# Patient Record
Sex: Female | Born: 1965 | Race: White | Hispanic: No | Marital: Single | Smoking: Never smoker
Health system: Southern US, Community
[De-identification: ages and names within clinical notes are randomized; demographics above are authoritative.]

## PROBLEM LIST (undated history)

## (undated) DIAGNOSIS — F419 Anxiety disorder, unspecified: Secondary | ICD-10-CM

## (undated) DIAGNOSIS — M533 Sacrococcygeal disorders, not elsewhere classified: Secondary | ICD-10-CM

## (undated) DIAGNOSIS — R112 Nausea with vomiting, unspecified: Secondary | ICD-10-CM

## (undated) DIAGNOSIS — M810 Age-related osteoporosis without current pathological fracture: Secondary | ICD-10-CM

## (undated) DIAGNOSIS — D649 Anemia, unspecified: Secondary | ICD-10-CM

## (undated) DIAGNOSIS — Z9889 Other specified postprocedural states: Secondary | ICD-10-CM

## (undated) DIAGNOSIS — T4145XA Adverse effect of unspecified anesthetic, initial encounter: Secondary | ICD-10-CM

## (undated) DIAGNOSIS — F329 Major depressive disorder, single episode, unspecified: Secondary | ICD-10-CM

## (undated) DIAGNOSIS — F32A Depression, unspecified: Secondary | ICD-10-CM

## (undated) DIAGNOSIS — T8859XA Other complications of anesthesia, initial encounter: Secondary | ICD-10-CM

## (undated) DIAGNOSIS — M1612 Unilateral primary osteoarthritis, left hip: Secondary | ICD-10-CM

## (undated) DIAGNOSIS — M1990 Unspecified osteoarthritis, unspecified site: Secondary | ICD-10-CM

## (undated) HISTORY — PX: GASTRIC BYPASS: SHX52

## (undated) HISTORY — PX: HERNIA REPAIR: SHX51

## (undated) HISTORY — PX: HAND SURGERY: SHX662

## (undated) HISTORY — DX: Age-related osteoporosis without current pathological fracture: M81.0

---

## 1995-08-30 HISTORY — PX: KNEE ARTHROSCOPY W/ ACL RECONSTRUCTION: SHX1858

## 2002-08-29 HISTORY — PX: CHOLECYSTECTOMY: SHX55

## 2002-08-29 HISTORY — PX: UMBILICAL HERNIA REPAIR: SHX196

## 2018-10-10 ENCOUNTER — Ambulatory Visit (INDEPENDENT_AMBULATORY_CARE_PROVIDER_SITE_OTHER): Payer: Medicaid - Out of State | Admitting: Orthopaedic Surgery

## 2018-10-10 VITALS — Ht 62.0 in | Wt 217.0 lb

## 2018-10-10 DIAGNOSIS — M1612 Unilateral primary osteoarthritis, left hip: Secondary | ICD-10-CM | POA: Diagnosis not present

## 2018-10-10 DIAGNOSIS — M25552 Pain in left hip: Secondary | ICD-10-CM | POA: Diagnosis not present

## 2018-10-10 MED ORDER — TRAMADOL HCL 50 MG PO TABS: 50.0000 mg | ORAL_TABLET | Freq: Four times a day (QID) | ORAL | 0 refills | Status: DC | PRN

## 2018-10-10 NOTE — Progress Notes (Signed)
Office Visit Note   Patient: Kaylee Salas           Date of Birth: 1966/01/21           MRN: [id number] Visit Date: 10/10/2018              Requested by: No referring provider defined for this encounter. PCP: Patient, No Pcp Per   Assessment & Plan: Visit Diagnoses:  1. Pain of left hip joint   2. Unilateral primary osteoarthritis, left hip     Plan: I do feel at this point she would benefit from a total hip arthroplasty of the left hip and I feel comfortable doing this based on examining her in a supine position and seeing how she looks overall on clinical exam and x-rays.  I had a long and thorough discussion with her about the surgery.  I gave her handout about hip replacement surgery.  We went over x-rays in detail.  I talked about the risk and benefits as well as the intraoperative and postoperative course and what to expect.  I do feel this would help give her better quality of life and improve mobility.  She does as well.  All question concerns were answered addressed.  We will work on getting her scheduled soon.  Follow-Up Instructions: Return for 2 weeks post-op.   Orders:  No orders of the defined types were placed in this encounter.  No orders of the defined types were placed in this encounter.     Procedures: No procedures performed   Clinical Data: No additional findings.   Subjective: Chief Complaint  Patient presents with  . Left Hip - Pain  The patient comes in today for evaluation treatment of known severe end-stage arthritis of her left hip.  Hip x-rays accompany her as well.  She lives in [place].  She is a very active individual but is slow down significantly over the last several years.  She did have gastric bypass surgery.  She does weigh 217 pounds at 5 foot 2 which gives her a BMI of 39.  She used to weigh about 150 pounds but with grief of a sister who raised her who died combined with ankle and foot injuries and a knee replacement have her  become more stoic and she gained a lot of weight.  She is now lost a significant of weight since then.  She still cannot take anti-inflammatories.  She does ambulate using crutches.  Her pain in her left hip is in the groin.  Is been worsening for over 2 years now.  At this point it is 10 out of 10 daily and is detrimental factor mobility her quality of life and her activities daily living.  She is not a diabetic and not a smoker.  HPI  Review of Systems She currently denies any headache, chest pain, shortness of breath, fever, chills, nausea, vomiting  Objective: Vital Signs: Ht 5\' 2"  (1.575 m)   Wt 217 lb (98.4 kg)   BMI 39.69 kg/m   Physical Exam She is alert and orient x3 and in no acute distress Ortho Exam Examination of her left hip shows limitations with internal and external rotation and severe pain with attempts of motion.  Her right hip exam is normal.  She has had a right knee replacement.  Her left knee appears stable.  Her left hip is severely painful. Specialty Comments:  No specialty comments available.  Imaging: No results found. X-rays that accompany her of  her pelvis and left hip show severe end-stage arthritis.  There is actually cystic changes in the femoral head and acetabulum which are osteoarthritic in nature.  There is sclerotic changes as well and osteophytes around the joint.  PMFS History: There are no active problems to display for this patient.  No past medical history on file.  No family history on file.   Social History   Occupational History  . Not on file  Tobacco Use  . Smoking status: Not on file  Substance and Sexual Activity  . Alcohol use: Not on file  . Drug use: Not on file  . Sexual activity: Not on file

## 2018-10-18 NOTE — Pre-Procedure Instructions (Addendum)
Kaylee Salas  10/18/2018      [id number] - [place], [place] [address] [place] [address] Phone: [phone] Fax: [phone]    Your procedure is scheduled on Tuesday, March 3rd.  Report to [facility] Admitting Entrance "A" at 10:00 A.M.  Call this number if you have problems the morning of surgery:  [phone]   Remember:  Do not eat or drink after midnight.    Take these medicines the morning of surgery with A SIP OF WATER  DULoxetine (CYMBALTA) gabapentin (NEURONTIN)  hydrOXYzine (ATARAX/VISTARIL) rOPINIRole (REQUIP) traMADol (ULTRAM) -as needed  7 days prior to surgery STOP taking any Aspirin (unless otherwise instructed by your surgeon), Aleve, Naproxen, Ibuprofen, Motrin, Advil, Goody's, BC's, all herbal medications, fish oil, and all vitamins.    Do not wear jewelry, make-up or nail polish.  Do not wear lotions, powders, or perfumes, or deodorant.  Do not shave 48 hours prior to surgery.  Men may shave face and neck.  Do not bring valuables to the hospital.  [facility] is not responsible for any belongings or valuables.  Contacts, dentures or bridgework may not be worn into surgery.  Leave your suitcase in the car.  After surgery it may be brought to your room.  For patients admitted to the hospital, discharge time will be determined by your treatment team.  Patients discharged the day of surgery will not be allowed to drive home.   Special instructions:   [place]- Preparing For Surgery  Before surgery, you can play an important role. Because skin is not sterile, your skin needs to be as free of germs as possible. You can reduce the number of germs on your skin by washing with CHG (chlorahexidine gluconate) Soap before surgery.  CHG is an antiseptic cleaner which kills germs and bonds with the skin to continue killing germs even after washing.    Oral Hygiene is also important  to reduce your risk of infection.  Remember - BRUSH YOUR TEETH THE MORNING OF SURGERY WITH YOUR REGULAR TOOTHPASTE  Please do not use if you have an allergy to CHG or antibacterial soaps. If your skin becomes reddened/irritated stop using the CHG.  Do not shave (including legs and underarms) for at least 48 hours prior to first CHG shower. It is OK to shave your face.  Please follow these instructions carefully.   1. Shower the NIGHT BEFORE SURGERY and the MORNING OF SURGERY with CHG.   2. If you chose to wash your hair, wash your hair first as usual with your normal shampoo.  3. After you shampoo, rinse your hair and body thoroughly to remove the shampoo.  4. Use CHG as you would any other liquid soap. You can apply CHG directly to the skin and wash gently with a scrungie or a clean washcloth.   5. Apply the CHG Soap to your body ONLY FROM THE NECK DOWN.  Do not use on open wounds or open sores. Avoid contact with your eyes, ears, mouth and genitals (private parts). Wash Face and genitals (private parts)  with your normal soap.  6. Wash thoroughly, paying special attention to the area where your surgery will be performed.  7. Thoroughly rinse your body with warm water from the neck down.  8. DO NOT shower/wash with your normal soap after using and rinsing off the CHG Soap.  9. Pat yourself dry with  a CLEAN TOWEL.  10. Wear CLEAN PAJAMAS to bed the night before surgery, wear comfortable clothes the morning of surgery  11. Place CLEAN SHEETS on your bed the night of your first shower and DO NOT SLEEP WITH PETS.    Day of Surgery:  Do not apply any deodorants/lotions.  Please wear clean clothes to the hospital/surgery center.   Remember to brush your teeth WITH YOUR REGULAR TOOTHPASTE.   Please read over the following fact sheets that you were given.

## 2018-10-19 ENCOUNTER — Other Ambulatory Visit (INDEPENDENT_AMBULATORY_CARE_PROVIDER_SITE_OTHER): Payer: Self-pay | Admitting: Physician Assistant

## 2018-10-19 ENCOUNTER — Encounter (HOSPITAL_COMMUNITY): Payer: Self-pay

## 2018-10-19 ENCOUNTER — Encounter (HOSPITAL_COMMUNITY)
Admission: RE | Admit: 2018-10-19 | Discharge: 2018-10-19 | Disposition: A | Discharge status: Home / Self Care | Payer: Medicaid - Out of State | Source: Ambulatory Visit | Attending: Orthopaedic Surgery | Admitting: Orthopaedic Surgery

## 2018-10-19 ENCOUNTER — Other Ambulatory Visit: Payer: Self-pay

## 2018-10-19 DIAGNOSIS — Z01812 Encounter for preprocedural laboratory examination: Secondary | ICD-10-CM | POA: Diagnosis present

## 2018-10-19 HISTORY — DX: Adverse effect of unspecified anesthetic, initial encounter: T41.45XA

## 2018-10-19 HISTORY — DX: Other complications of anesthesia, initial encounter: T88.59XA

## 2018-10-19 HISTORY — DX: Major depressive disorder, single episode, unspecified: F32.9

## 2018-10-19 HISTORY — DX: Depression, unspecified: F32.A

## 2018-10-19 HISTORY — DX: Unspecified osteoarthritis, unspecified site: M19.90

## 2018-10-19 HISTORY — DX: Anxiety disorder, unspecified: F41.9

## 2018-10-19 LAB — CBC
HCT: 40 % (ref 36.0–46.0)
Hemoglobin: 13.3 g/dL (ref 12.0–15.0)
MCH: 31.1 pg (ref 26.0–34.0)
MCHC: 33.3 g/dL (ref 30.0–36.0)
MCV: 93.5 fL (ref 80.0–100.0)
Platelets: 377 10*3/uL (ref 150–400)
RBC: 4.28 MIL/uL (ref 3.87–5.11)
RDW: 12.5 % (ref 11.5–15.5)
WBC: 8.2 10*3/uL (ref 4.0–10.5)
nRBC: 0 % (ref 0.0–0.2)

## 2018-10-19 LAB — SURGICAL PCR SCREEN
MRSA, PCR: NEGATIVE
Staphylococcus aureus: NEGATIVE

## 2018-10-19 NOTE — Progress Notes (Signed)
PCP - Dr. Emelda Fear [place], [place]) Cardiologist - denies  Chest x-ray - N/A EKG - N/A Stress Test - 5+ years ago  ECHO - 5+ years ago Cardiac Cath - denies  Sleep Study - OSA + prior to gastric bypass surgery. No longer an issue.  CPAP - denies  Blood Thinner Instructions: N/A Aspirin Instructions:N/A  Anesthesia review: No  Patient denies shortness of breath, fever, cough and chest pain at PAT appointment   Patient verbalized understanding of instructions that were given to them at the PAT appointment. Patient was also instructed that they will need to review over the PAT instructions again at home before surgery.

## 2018-10-30 ENCOUNTER — Encounter (HOSPITAL_COMMUNITY): Payer: Self-pay | Admitting: *Deleted

## 2018-10-30 ENCOUNTER — Encounter (HOSPITAL_COMMUNITY)
Admission: RE | Disposition: A | Discharge status: Skilled Nursing Facility | Payer: Self-pay | Source: Home / Self Care | Attending: Orthopaedic Surgery

## 2018-10-30 ENCOUNTER — Inpatient Hospital Stay (HOSPITAL_COMMUNITY): Payer: Medicaid - Out of State | Admitting: Certified Registered Nurse Anesthetist

## 2018-10-30 ENCOUNTER — Inpatient Hospital Stay (HOSPITAL_COMMUNITY): Payer: Medicaid - Out of State

## 2018-10-30 ENCOUNTER — Inpatient Hospital Stay (HOSPITAL_COMMUNITY)
Admission: RE | Admit: 2018-10-30 | Discharge: 2018-11-02 | DRG: 470 | Disposition: A | Discharge status: Skilled Nursing Facility | Payer: Medicaid - Out of State | Attending: Orthopaedic Surgery | Admitting: Orthopaedic Surgery

## 2018-10-30 DIAGNOSIS — Z6841 Body Mass Index (BMI) 40.0 and over, adult: Secondary | ICD-10-CM

## 2018-10-30 DIAGNOSIS — F329 Major depressive disorder, single episode, unspecified: Secondary | ICD-10-CM | POA: Diagnosis present

## 2018-10-30 DIAGNOSIS — Z9049 Acquired absence of other specified parts of digestive tract: Secondary | ICD-10-CM | POA: Diagnosis not present

## 2018-10-30 DIAGNOSIS — M1612 Unilateral primary osteoarthritis, left hip: Principal | ICD-10-CM

## 2018-10-30 DIAGNOSIS — Z9884 Bariatric surgery status: Secondary | ICD-10-CM | POA: Diagnosis not present

## 2018-10-30 DIAGNOSIS — Z419 Encounter for procedure for purposes other than remedying health state, unspecified: Secondary | ICD-10-CM

## 2018-10-30 DIAGNOSIS — F419 Anxiety disorder, unspecified: Secondary | ICD-10-CM | POA: Diagnosis present

## 2018-10-30 DIAGNOSIS — Z96642 Presence of left artificial hip joint: Secondary | ICD-10-CM

## 2018-10-30 DIAGNOSIS — K5900 Constipation, unspecified: Secondary | ICD-10-CM | POA: Diagnosis not present

## 2018-10-30 HISTORY — PX: TOTAL HIP ARTHROPLASTY: SHX124

## 2018-10-30 HISTORY — DX: Unilateral primary osteoarthritis, left hip: M16.12

## 2018-10-30 SURGERY — ARTHROPLASTY, HIP, TOTAL, ANTERIOR APPROACH
Anesthesia: Spinal | Site: Hip | Laterality: Left

## 2018-10-30 MED ORDER — HYOSCYAMINE SULFATE 0.125 MG PO TABS
0.1250 mg | ORAL_TABLET | ORAL | Status: DC | PRN
  Filled 2018-10-30: qty 1

## 2018-10-30 MED ORDER — SUCRALFATE 1 G PO TABS
1.0000 g | ORAL_TABLET | Freq: Three times a day (TID) | ORAL | Status: DC
  Administered 2018-10-30 – 2018-11-02 (×10): 1 g via ORAL
  Filled 2018-10-30 (×10): qty 1

## 2018-10-30 MED ORDER — DIPHENHYDRAMINE HCL 12.5 MG/5ML PO ELIX: 12.5000 mg | ORAL_SOLUTION | ORAL | Status: DC | PRN

## 2018-10-30 MED ORDER — MIDAZOLAM HCL 2 MG/2ML IJ SOLN: 0.5000 mg | Freq: Once | INTRAMUSCULAR | Status: DC | PRN

## 2018-10-30 MED ORDER — DEXAMETHASONE SODIUM PHOSPHATE 10 MG/ML IJ SOLN
INTRAMUSCULAR | Status: AC
  Filled 2018-10-30: qty 1

## 2018-10-30 MED ORDER — ASPIRIN 81 MG PO CHEW
81.0000 mg | CHEWABLE_TABLET | Freq: Two times a day (BID) | ORAL | Status: DC
  Administered 2018-10-30 – 2018-11-02 (×6): 81 mg via ORAL
  Filled 2018-10-30 (×7): qty 1

## 2018-10-30 MED ORDER — SODIUM CHLORIDE 0.9 % IR SOLN
Status: DC | PRN
  Administered 2018-10-30: 3000 mL

## 2018-10-30 MED ORDER — ALUM & MAG HYDROXIDE-SIMETH 200-200-20 MG/5ML PO SUSP
30.0000 mL | ORAL | Status: DC | PRN
  Administered 2018-11-02: 30 mL via ORAL
  Filled 2018-10-30: qty 30

## 2018-10-30 MED ORDER — OXYCODONE HCL 5 MG PO TABS
ORAL_TABLET | ORAL | Status: AC
  Filled 2018-10-30: qty 2

## 2018-10-30 MED ORDER — METHOCARBAMOL 500 MG PO TABS
ORAL_TABLET | ORAL | Status: AC
  Filled 2018-10-30: qty 1

## 2018-10-30 MED ORDER — CEFAZOLIN SODIUM-DEXTROSE 1-4 GM/50ML-% IV SOLN
1.0000 g | Freq: Four times a day (QID) | INTRAVENOUS | Status: AC
  Administered 2018-10-30 – 2018-10-31 (×2): 1 g via INTRAVENOUS
  Filled 2018-10-30 (×2): qty 50

## 2018-10-30 MED ORDER — ROPINIROLE HCL 1 MG PO TABS
1.0000 mg | ORAL_TABLET | Freq: Four times a day (QID) | ORAL | Status: DC
  Administered 2018-10-30 – 2018-11-02 (×7): 1 mg via ORAL
  Filled 2018-10-30 (×7): qty 1

## 2018-10-30 MED ORDER — LACTATED RINGERS IV SOLN
INTRAVENOUS | Status: DC
  Administered 2018-10-30 (×3): via INTRAVENOUS

## 2018-10-30 MED ORDER — MENTHOL 3 MG MT LOZG: 1.0000 | LOZENGE | OROMUCOSAL | Status: DC | PRN

## 2018-10-30 MED ORDER — OXYCODONE HCL 5 MG PO TABS
10.0000 mg | ORAL_TABLET | ORAL | Status: DC | PRN
  Administered 2018-10-31 – 2018-11-02 (×9): 15 mg via ORAL
  Filled 2018-10-30 (×9): qty 3

## 2018-10-30 MED ORDER — EPHEDRINE SULFATE-NACL 50-0.9 MG/10ML-% IV SOSY
PREFILLED_SYRINGE | INTRAVENOUS | Status: DC | PRN
  Administered 2018-10-30: 5 mg via INTRAVENOUS
  Administered 2018-10-30: 10 mg via INTRAVENOUS
  Administered 2018-10-30 (×2): 5 mg via INTRAVENOUS

## 2018-10-30 MED ORDER — HYDROMORPHONE HCL 1 MG/ML IJ SOLN
INTRAMUSCULAR | Status: AC
  Filled 2018-10-30: qty 1

## 2018-10-30 MED ORDER — ONDANSETRON HCL 4 MG/2ML IJ SOLN
INTRAMUSCULAR | Status: AC
  Filled 2018-10-30: qty 2

## 2018-10-30 MED ORDER — PROPOFOL 10 MG/ML IV BOLUS
INTRAVENOUS | Status: AC
  Filled 2018-10-30: qty 20

## 2018-10-30 MED ORDER — LIDOCAINE 2% (20 MG/ML) 5 ML SYRINGE
INTRAMUSCULAR | Status: AC
  Filled 2018-10-30: qty 5

## 2018-10-30 MED ORDER — METHOCARBAMOL 1000 MG/10ML IJ SOLN
500.0000 mg | Freq: Four times a day (QID) | INTRAVENOUS | Status: DC | PRN
  Filled 2018-10-30: qty 5

## 2018-10-30 MED ORDER — SODIUM CHLORIDE 0.9 % IV SOLN
INTRAVENOUS | Status: DC
  Administered 2018-10-30: 18:00:00 via INTRAVENOUS

## 2018-10-30 MED ORDER — SOD CITRATE-CITRIC ACID 500-334 MG/5ML PO SOLN
30.0000 mL | Freq: Once | ORAL | Status: AC
  Administered 2018-10-30: 30 mL via ORAL
  Filled 2018-10-30: qty 30

## 2018-10-30 MED ORDER — HYDROMORPHONE HCL 1 MG/ML IJ SOLN
0.5000 mg | INTRAMUSCULAR | Status: DC | PRN
  Administered 2018-10-30 – 2018-10-31 (×2): 1 mg via INTRAVENOUS
  Filled 2018-10-30 (×3): qty 1

## 2018-10-30 MED ORDER — 0.9 % SODIUM CHLORIDE (POUR BTL) OPTIME
TOPICAL | Status: DC | PRN
  Administered 2018-10-30: 1000 mL

## 2018-10-30 MED ORDER — POLYETHYLENE GLYCOL 3350 17 G PO PACK
17.0000 g | PACK | Freq: Every day | ORAL | Status: DC | PRN
  Administered 2018-11-01: 17 g via ORAL
  Filled 2018-10-30: qty 1

## 2018-10-30 MED ORDER — SODIUM CHLORIDE 0.9 % IV SOLN
INTRAVENOUS | Status: DC | PRN
  Administered 2018-10-30: 25 ug/min via INTRAVENOUS

## 2018-10-30 MED ORDER — ZOLPIDEM TARTRATE 5 MG PO TABS: 5.0000 mg | ORAL_TABLET | Freq: Every evening | ORAL | Status: DC | PRN

## 2018-10-30 MED ORDER — MIDAZOLAM HCL 2 MG/2ML IJ SOLN
INTRAMUSCULAR | Status: AC
  Filled 2018-10-30: qty 2

## 2018-10-30 MED ORDER — CHLORHEXIDINE GLUCONATE 4 % EX LIQD: 60.0000 mL | Freq: Once | CUTANEOUS | Status: DC

## 2018-10-30 MED ORDER — FAMOTIDINE 20 MG PO TABS
10.0000 mg | ORAL_TABLET | Freq: Two times a day (BID) | ORAL | Status: DC
  Administered 2018-10-30 – 2018-11-02 (×6): 10 mg via ORAL
  Filled 2018-10-30 (×6): qty 1

## 2018-10-30 MED ORDER — ACETAMINOPHEN 325 MG PO TABS
325.0000 mg | ORAL_TABLET | Freq: Four times a day (QID) | ORAL | Status: DC | PRN
  Filled 2018-10-30: qty 2

## 2018-10-30 MED ORDER — HYDROXYZINE HCL 25 MG PO TABS
25.0000 mg | ORAL_TABLET | Freq: Two times a day (BID) | ORAL | Status: DC
  Administered 2018-10-30 – 2018-11-02 (×6): 25 mg via ORAL
  Filled 2018-10-30 (×6): qty 1

## 2018-10-30 MED ORDER — LIDOCAINE 2% (20 MG/ML) 5 ML SYRINGE
INTRAMUSCULAR | Status: DC | PRN
  Administered 2018-10-30 (×2): 50 mg via INTRAVENOUS

## 2018-10-30 MED ORDER — GABAPENTIN 100 MG PO CAPS
100.0000 mg | ORAL_CAPSULE | Freq: Three times a day (TID) | ORAL | Status: DC
  Administered 2018-10-30 – 2018-11-02 (×7): 100 mg via ORAL
  Filled 2018-10-30 (×7): qty 1

## 2018-10-30 MED ORDER — DEXAMETHASONE SODIUM PHOSPHATE 10 MG/ML IJ SOLN
INTRAMUSCULAR | Status: DC | PRN
  Administered 2018-10-30: 10 mg via INTRAVENOUS

## 2018-10-30 MED ORDER — BUPIVACAINE IN DEXTROSE 0.75-8.25 % IT SOLN
INTRATHECAL | Status: DC | PRN
  Administered 2018-10-30: 12 mg via INTRATHECAL

## 2018-10-30 MED ORDER — PHENYLEPHRINE 40 MCG/ML (10ML) SYRINGE FOR IV PUSH (FOR BLOOD PRESSURE SUPPORT)
PREFILLED_SYRINGE | INTRAVENOUS | Status: AC
  Filled 2018-10-30: qty 10

## 2018-10-30 MED ORDER — ALPRAZOLAM 0.25 MG PO TABS
0.2500 mg | ORAL_TABLET | Freq: Once | ORAL | Status: DC
  Filled 2018-10-30 (×2): qty 1

## 2018-10-30 MED ORDER — DOCUSATE SODIUM 100 MG PO CAPS
100.0000 mg | ORAL_CAPSULE | Freq: Two times a day (BID) | ORAL | Status: DC
  Administered 2018-10-30 – 2018-11-02 (×6): 100 mg via ORAL
  Filled 2018-10-30 (×6): qty 1

## 2018-10-30 MED ORDER — PROMETHAZINE HCL 25 MG/ML IJ SOLN: 6.2500 mg | INTRAMUSCULAR | Status: DC | PRN

## 2018-10-30 MED ORDER — ONDANSETRON HCL 4 MG/2ML IJ SOLN
INTRAMUSCULAR | Status: DC | PRN
  Administered 2018-10-30: 4 mg via INTRAVENOUS

## 2018-10-30 MED ORDER — METHOCARBAMOL 500 MG PO TABS
500.0000 mg | ORAL_TABLET | Freq: Four times a day (QID) | ORAL | Status: DC | PRN
  Administered 2018-10-30 – 2018-11-01 (×6): 500 mg via ORAL
  Filled 2018-10-30 (×5): qty 1

## 2018-10-30 MED ORDER — HYOSCYAMINE SULFATE 0.125 MG SL SUBL
0.1250 mg | SUBLINGUAL_TABLET | SUBLINGUAL | Status: DC | PRN
  Administered 2018-11-01: 0.125 mg via SUBLINGUAL
  Filled 2018-10-30 (×2): qty 1

## 2018-10-30 MED ORDER — CEFAZOLIN SODIUM-DEXTROSE 2-4 GM/100ML-% IV SOLN
2.0000 g | INTRAVENOUS | Status: AC
  Administered 2018-10-30: 2 g via INTRAVENOUS
  Filled 2018-10-30: qty 100

## 2018-10-30 MED ORDER — STERILE WATER FOR IRRIGATION IR SOLN
Status: DC | PRN
  Administered 2018-10-30: 1000 mL

## 2018-10-30 MED ORDER — PHENOL 1.4 % MT LIQD: 1.0000 | OROMUCOSAL | Status: DC | PRN

## 2018-10-30 MED ORDER — PROPOFOL 500 MG/50ML IV EMUL
INTRAVENOUS | Status: DC | PRN
  Administered 2018-10-30: 50 ug/kg/min via INTRAVENOUS

## 2018-10-30 MED ORDER — HYDROMORPHONE HCL 1 MG/ML IJ SOLN
0.2500 mg | INTRAMUSCULAR | Status: DC | PRN
  Administered 2018-10-30 (×3): 0.5 mg via INTRAVENOUS

## 2018-10-30 MED ORDER — PHENYLEPHRINE 40 MCG/ML (10ML) SYRINGE FOR IV PUSH (FOR BLOOD PRESSURE SUPPORT)
PREFILLED_SYRINGE | INTRAVENOUS | Status: DC | PRN
  Administered 2018-10-30 (×2): 80 ug via INTRAVENOUS
  Administered 2018-10-30 (×4): 120 ug via INTRAVENOUS

## 2018-10-30 MED ORDER — EPHEDRINE 5 MG/ML INJ
INTRAVENOUS | Status: AC
  Filled 2018-10-30: qty 10

## 2018-10-30 MED ORDER — FENTANYL CITRATE (PF) 250 MCG/5ML IJ SOLN
INTRAMUSCULAR | Status: AC
  Filled 2018-10-30: qty 5

## 2018-10-30 MED ORDER — MIDAZOLAM HCL 5 MG/5ML IJ SOLN
INTRAMUSCULAR | Status: DC | PRN
  Administered 2018-10-30 (×2): 2 mg via INTRAVENOUS

## 2018-10-30 MED ORDER — ONDANSETRON HCL 4 MG/2ML IJ SOLN
4.0000 mg | Freq: Four times a day (QID) | INTRAMUSCULAR | Status: DC | PRN
  Administered 2018-10-30: 4 mg via INTRAVENOUS

## 2018-10-30 MED ORDER — METOCLOPRAMIDE HCL 5 MG/ML IJ SOLN: 5.0000 mg | Freq: Three times a day (TID) | INTRAMUSCULAR | Status: DC | PRN

## 2018-10-30 MED ORDER — MEPERIDINE HCL 50 MG/ML IJ SOLN: 6.2500 mg | INTRAMUSCULAR | Status: DC | PRN

## 2018-10-30 MED ORDER — OXYCODONE HCL 5 MG PO TABS
5.0000 mg | ORAL_TABLET | ORAL | Status: DC | PRN
  Administered 2018-10-30 – 2018-11-01 (×7): 10 mg via ORAL
  Filled 2018-10-30 (×6): qty 2

## 2018-10-30 MED ORDER — METOCLOPRAMIDE HCL 5 MG PO TABS: 5.0000 mg | ORAL_TABLET | Freq: Three times a day (TID) | ORAL | Status: DC | PRN

## 2018-10-30 MED ORDER — DULOXETINE HCL 60 MG PO CPEP
60.0000 mg | ORAL_CAPSULE | Freq: Every day | ORAL | Status: DC
  Administered 2018-10-30 – 2018-11-02 (×4): 60 mg via ORAL
  Filled 2018-10-30 (×5): qty 1

## 2018-10-30 MED ORDER — ONDANSETRON HCL 4 MG PO TABS: 4.0000 mg | ORAL_TABLET | Freq: Four times a day (QID) | ORAL | Status: DC | PRN

## 2018-10-30 MED ORDER — TRANEXAMIC ACID-NACL 1000-0.7 MG/100ML-% IV SOLN
INTRAVENOUS | Status: AC
  Filled 2018-10-30: qty 100

## 2018-10-30 MED ORDER — TRANEXAMIC ACID-NACL 1000-0.7 MG/100ML-% IV SOLN
1000.0000 mg | INTRAVENOUS | Status: AC
  Administered 2018-10-30: 1000 mg via INTRAVENOUS
  Filled 2018-10-30: qty 100

## 2018-10-30 SURGICAL SUPPLY — 56 items
ARTICULEZE HEAD (Hips) ×2 IMPLANT
BENZOIN TINCTURE PRP APPL 2/3 (GAUZE/BANDAGES/DRESSINGS) ×2 IMPLANT
BLADE CLIPPER SURG (BLADE) IMPLANT
BLADE SAGITTAL 25.0X1.19X90 (BLADE) ×1 IMPLANT
BLADE SAW SGTL 18X1.27X75 (BLADE) ×2 IMPLANT
CHLORAPREP W/TINT 26ML (MISCELLANEOUS) ×1 IMPLANT
COLLAR OFFSET CORAIL SZ 12 HIP (Stem) IMPLANT
CORAIL OFFSET COLLAR SZ 12 HIP (Stem) ×2 IMPLANT
COVER SURGICAL LIGHT HANDLE (MISCELLANEOUS) ×2 IMPLANT
COVER WAND RF STERILE (DRAPES) ×2 IMPLANT
DRAPE C-ARM 42X72 X-RAY (DRAPES) ×2 IMPLANT
DRAPE STERI IOBAN 125X83 (DRAPES) ×2 IMPLANT
DRAPE U-SHAPE 47X51 STRL (DRAPES) ×6 IMPLANT
DRSG AQUACEL AG ADV 3.5X10 (GAUZE/BANDAGES/DRESSINGS) ×2 IMPLANT
ELECT BLADE 4.0 EZ CLEAN MEGAD (MISCELLANEOUS) ×2
ELECT BLADE 6.5 EXT (BLADE) IMPLANT
ELECT REM PT RETURN 9FT ADLT (ELECTROSURGICAL) ×2
ELECTRODE BLDE 4.0 EZ CLN MEGD (MISCELLANEOUS) ×1 IMPLANT
ELECTRODE REM PT RTRN 9FT ADLT (ELECTROSURGICAL) ×1 IMPLANT
FACESHIELD WRAPAROUND (MASK) ×4 IMPLANT
FACESHIELD WRAPAROUND OR TEAM (MASK) ×2 IMPLANT
GAUZE XEROFORM 1X8 LF (GAUZE/BANDAGES/DRESSINGS) ×1 IMPLANT
GLOVE BIOGEL PI IND STRL 8 (GLOVE) ×2 IMPLANT
GLOVE BIOGEL PI INDICATOR 8 (GLOVE) ×2
GLOVE ECLIPSE 8.0 STRL XLNG CF (GLOVE) ×2 IMPLANT
GLOVE ORTHO TXT STRL SZ7.5 (GLOVE) ×4 IMPLANT
GOWN STRL REUS W/ TWL LRG LVL3 (GOWN DISPOSABLE) ×2 IMPLANT
GOWN STRL REUS W/ TWL XL LVL3 (GOWN DISPOSABLE) ×2 IMPLANT
GOWN STRL REUS W/TWL LRG LVL3 (GOWN DISPOSABLE) ×4
GOWN STRL REUS W/TWL XL LVL3 (GOWN DISPOSABLE) ×6
HANDPIECE INTERPULSE COAX TIP (DISPOSABLE) ×1
HEAD ARTICULEZE (Hips) IMPLANT
KIT BASIN OR (CUSTOM PROCEDURE TRAY) ×2 IMPLANT
KIT TURNOVER KIT B (KITS) ×2 IMPLANT
LINER ACETAB NEUTRAL 36ID 520D (Liner) ×1 IMPLANT
MANIFOLD NEPTUNE II (INSTRUMENTS) ×2 IMPLANT
NS IRRIG 1000ML POUR BTL (IV SOLUTION) ×2 IMPLANT
PACK TOTAL JOINT (CUSTOM PROCEDURE TRAY) ×2 IMPLANT
PAD ARMBOARD 7.5X6 YLW CONV (MISCELLANEOUS) ×2 IMPLANT
PIN SECTOR W/GRIP ACE CUP 52MM (Hips) ×1 IMPLANT
SET HNDPC FAN SPRY TIP SCT (DISPOSABLE) ×1 IMPLANT
STAPLER VISISTAT 35W (STAPLE) ×1 IMPLANT
STRIP CLOSURE SKIN 1/2X4 (GAUZE/BANDAGES/DRESSINGS) ×3 IMPLANT
SUT ETHIBOND NAB CT1 #1 30IN (SUTURE) ×2 IMPLANT
SUT ETHILON 2 0 FS 18 (SUTURE) ×1 IMPLANT
SUT MNCRL AB 4-0 PS2 18 (SUTURE) ×1 IMPLANT
SUT VIC AB 0 CT1 27 (SUTURE) ×1
SUT VIC AB 0 CT1 27XBRD ANBCTR (SUTURE) ×1 IMPLANT
SUT VIC AB 1 CT1 27 (SUTURE) ×3
SUT VIC AB 1 CT1 27XBRD ANBCTR (SUTURE) ×1 IMPLANT
SUT VIC AB 2-0 CT1 27 (SUTURE) ×6
SUT VIC AB 2-0 CT1 TAPERPNT 27 (SUTURE) ×1 IMPLANT
TOWEL GREEN STERILE (TOWEL DISPOSABLE) ×1 IMPLANT
TOWEL GREEN STERILE FF (TOWEL DISPOSABLE) ×1 IMPLANT
TRAY FOLEY MTR SLVR 16FR STAT (SET/KITS/TRAYS/PACK) ×1 IMPLANT
WATER STERILE IRR 1000ML POUR (IV SOLUTION) ×5 IMPLANT

## 2018-10-30 NOTE — Anesthesia Procedure Notes (Signed)
Spinal  Patient location during procedure: OR End time: 10/30/2018 1:26 PM Staffing Anesthesiologist: Annye Asa, MD Performed: anesthesiologist  Preanesthetic Checklist Completed: patient identified, site marked, surgical consent, pre-op evaluation, timeout performed, IV checked, risks and benefits discussed and monitors and equipment checked Spinal Block Patient position: sitting Prep: site prepped and draped and DuraPrep Patient monitoring: blood pressure, continuous pulse ox, cardiac monitor and heart rate Approach: midline Location: L2-3 Injection technique: single-shot Needle Needle type: Pencan  Needle gauge: 24 G Needle length: 9 cm Additional Notes Pt identified in Operating room.  Monitors applied. Working IV access confirmed. Sterile prep, drape lumbar spine.  1% lido local L 3,4.  #24ga Pencan os, Repeat local L2,3 and #24ga Pencan into clear CSF 12mg  0.75% Bupivacaine with dextrose injected with asp CSF beginning and end of injection.  Patient asymptomatic, VSS, no heme aspirated, tolerated well.  Jenita Seashore, MD

## 2018-10-30 NOTE — Anesthesia Postprocedure Evaluation (Signed)
Anesthesia Post Note  Patient: Kaylee Salas  Procedure(s) Performed: LEFT TOTAL HIP ARTHROPLASTY ANTERIOR APPROACH (Left Hip)     Patient location during evaluation: PACU Anesthesia Type: Spinal Level of consciousness: awake and alert, oriented and patient cooperative Pain management: pain level controlled Vital Signs Assessment: post-procedure vital signs reviewed and stable Respiratory status: spontaneous breathing, nonlabored ventilation and respiratory function stable Cardiovascular status: blood pressure returned to baseline and stable Postop Assessment: spinal receding, no apparent nausea or vomiting, patient able to bend at knees and adequate PO intake Anesthetic complications: no    Last Vitals:  Vitals:   10/30/18 1600 10/30/18 1615  BP: 94/80 118/74  Pulse: 77 (!) 51  Resp: 17 16  Temp:    SpO2: 97% 96%    Last Pain:  Vitals:   10/30/18 1515  TempSrc:   PainSc: 0-No pain                 Valmore Arabie,E. Asaiah Scarber

## 2018-10-30 NOTE — Progress Notes (Signed)
Physical Therapy Evaluation Patient Details Name: ABBAGAYLE Salas MRN: [id number] DOB: 06-20-1966 Today's Date: 10/30/2018   History of Present Illness  Patient is 53 y/o female s/p L THA. PMH includes anxiety, gastric bypass, and R knee arthoscopy with ACL reconstruction.   Clinical Impression  Patient admitted to hospital secondary to problems above and with deficits below. Patient required min guard to stand and ambulate short distance in room with use of RW. Educated and reviewed supine HEP. Patient will benefit from acute physical therapy to maximize independence and safety with functional mobility.     Follow Up Recommendations Follow surgeon's recommendation for DC plan and follow-up therapies    Equipment Recommendations  Other (comment)(TBD at next venue)    Recommendations for Other Services       Precautions / Restrictions Precautions Precautions: Fall Restrictions Weight Bearing Restrictions: Yes LLE Weight Bearing: Weight bearing as tolerated      Mobility  Bed Mobility Overal bed mobility: Needs Assistance Bed Mobility: Supine to Sit;Sit to Supine     Supine to sit: Supervision Sit to supine: Supervision   General bed mobility comments: Patient required supervision for bed mobility for safety. Required increased time for all bed mobility.  Transfers Overall transfer level: Needs assistance Equipment used: Rolling walker (2 wheeled) Transfers: Sit to/from Stand Sit to Stand: Min guard         General transfer comment: Patient required min guard to stand with use of RW for safety. Verbal cues for hand placement when using RW. Denies dizziness or lightneadedness. Reports feeling like she just "needs to eat"  Ambulation/Gait Ambulation/Gait assistance: Min guard Gait Distance (Feet): 5 Feet Assistive device: Rolling walker (2 wheeled) Gait Pattern/deviations: Step-to pattern;Decreased step length - right;Decreased stance time - left;Decreased stride  length;Decreased weight shift to left;Antalgic Gait velocity: decreased Gait velocity interpretation: <1.8 ft/sec, indicate of risk for recurrent falls General Gait Details: Patient ambulated short distance in room with min guard and use of RW for safety. Verbal cues for sequencing with RW. Gait was extremely slow.  Stairs            Wheelchair Mobility    Modified Rankin (Stroke Patients Only)       Balance Overall balance assessment: Needs assistance Sitting-balance support: No upper extremity supported;Feet supported Sitting balance-Leahy Scale: Good     Standing balance support: Bilateral upper extremity supported Standing balance-Leahy Scale: Poor Standing balance comment: reliant on BUE support to maintain standing balance                             Pertinent Vitals/Pain Pain Assessment: Faces Faces Pain Scale: Hurts a little bit Pain Location: L hip Pain Descriptors / Indicators: Aching;Sore Pain Intervention(s): Limited activity within patient's tolerance;Monitored during session    Home Living Family/patient expects to be discharged to:: Skilled nursing facility                      Prior Function Level of Independence: Independent               Hand Dominance        Extremity/Trunk Assessment   Upper Extremity Assessment Upper Extremity Assessment: Defer to OT evaluation    Lower Extremity Assessment Lower Extremity Assessment: LLE deficits/detail LLE Deficits / Details: LLE deficits consistent with post op pain and weakness    Cervical / Trunk Assessment Cervical / Trunk Assessment: Normal  Communication   Communication:  No difficulties  Cognition Arousal/Alertness: Suspect due to medications Behavior During Therapy: WFL for tasks assessed/performed Overall Cognitive Status: Within Functional Limits for tasks assessed                                        General Comments General comments  (skin integrity, edema, etc.): Patient very eager to complete all mobility tasks    Exercises Total Joint Exercises Ankle Circles/Pumps: AROM;Both;20 reps;Supine Quad Sets: AROM;Left;10 reps;Supine Heel Slides: AROM;Left;10 reps;Supine   Assessment/Plan    PT Assessment Patient needs continued PT services  PT Problem List Decreased strength;Decreased range of motion;Decreased activity tolerance;Decreased balance;Decreased mobility;Decreased knowledge of use of DME;Decreased knowledge of precautions       PT Treatment Interventions DME instruction;Gait training;Stair training;Functional mobility training;Therapeutic activities;Therapeutic exercise;Balance training;Patient/family education    PT Goals (Current goals can be found in the Care Plan section)  Acute Rehab PT Goals Patient Stated Goal: [place] PT Goal Formulation: With patient Time For Goal Achievement: 11/13/18 Potential to Achieve Goals: Good    Frequency 7X/week   Barriers to discharge        Co-evaluation               AM-PAC PT "6 Clicks" Mobility  Outcome Measure Help needed turning from your back to your side while in a flat bed without using bedrails?: None Help needed moving from lying on your back to sitting on the side of a flat bed without using bedrails?: A Little Help needed moving to and from a bed to a chair (including a wheelchair)?: A Little Help needed standing up from a chair using your arms (e.g., wheelchair or bedside chair)?: A Little Help needed to walk in hospital room?: A Little Help needed climbing 3-5 steps with a railing? : A Lot 6 Click Score: 18    End of Session Equipment Utilized During Treatment: Gait belt Activity Tolerance: Patient tolerated treatment well Patient left: in bed;with call bell/phone within reach;with family/visitor present Nurse Communication: Mobility status PT Visit Diagnosis: Muscle weakness (generalized) (M62.81);Difficulty in  walking, not elsewhere classified (R26.2);Other abnormalities of gait and mobility (R26.89)    Time: 0938-1829 PT Time Calculation (min) (ACUTE ONLY): 45 min   Charges:   PT Evaluation $PT Eval Low Complexity: 1 Low PT Treatments $Therapeutic Exercise: 8-22 mins $Therapeutic Activity: 8-22 mins        Erick Blinks, SPT  Erick Blinks 10/30/2018, 7:07 PM

## 2018-10-30 NOTE — Plan of Care (Signed)
  Problem: Clinical Measurements: Goal: Ability to maintain clinical measurements within normal limits will improve Outcome: Progressing Goal: Respiratory complications will improve Outcome: Progressing   Problem: Activity: Goal: Risk for activity intolerance will decrease Outcome: Progressing   Problem: Nutrition: Goal: Adequate nutrition will be maintained Outcome: Progressing   Problem: Coping: Goal: Level of anxiety will decrease Outcome: Progressing   Problem: Elimination: Goal: Will not experience complications related to bowel motility Outcome: Progressing   Problem: Safety: Goal: Ability to remain free from injury will improve Outcome: Progressing   Problem: Skin Integrity: Goal: Risk for impaired skin integrity will decrease Outcome: Progressing

## 2018-10-30 NOTE — H&P (Signed)
TOTAL HIP ADMISSION H&P  Patient is admitted for left total hip arthroplasty.  Subjective:  Chief Complaint: left hip pain  HPI: Kaylee Salas, 53 y.o. female, has a history of pain and functional disability in the left hip(s) due to arthritis and patient has failed non-surgical conservative treatments for greater than 12 weeks to include NSAID's and/or analgesics, corticosteriod injections, use of assistive devices, weight reduction as appropriate and activity modification.  Onset of symptoms was gradual starting 2 years ago with gradually worsening course since that time.The patient noted no past surgery on the left hip(s).  Patient currently rates pain in the left hip at 10 out of 10 with activity. Patient has night pain, worsening of pain with activity and weight bearing, trendelenberg gait, pain that interfers with activities of daily living, pain with passive range of motion, crepitus and joint swelling. Patient has evidence of subchondral cysts, subchondral sclerosis, periarticular osteophytes and joint space narrowing by imaging studies. This condition presents safety issues increasing the risk of falls.  There is no current active infection.  Patient Active Problem List   Diagnosis Date Noted  . Unilateral primary osteoarthritis, left hip 10/30/2018   Past Medical History:  Diagnosis Date  . Anxiety   . Arthritis   . Complication of anesthesia    "woke up panicking/fighting"  . Depression   . Unilateral primary osteoarthritis, left hip 10/30/2018    Past Surgical History:  Procedure Laterality Date  . CHOLECYSTECTOMY  2004  . GASTRIC BYPASS    . HAND SURGERY Right    ring finger reconstruction  . HERNIA REPAIR    . KNEE ARTHROSCOPY W/ ACL RECONSTRUCTION Right 1997  . UMBILICAL HERNIA REPAIR  2004    Current Facility-Administered Medications  Medication Dose Route Frequency Provider Last Rate Last Dose  . ceFAZolin (ANCEF) IVPB 2g/100 mL premix  2 g Intravenous On Call to OR  Pete Pelt, PA-C      . chlorhexidine (HIBICLENS) 4 % liquid 4 application  60 mL Topical Once Erskine Emery W, PA-C      . lactated ringers infusion   Intravenous Continuous Lillia Abed, MD 50 mL/hr at 10/30/18 1108    . tranexamic acid (CYKLOKAPRON) 1000MG /183mL IVPB           . tranexamic acid (CYKLOKAPRON) IVPB 1,000 mg  1,000 mg Intravenous To OR Mcarthur Rossetti, MD       No Known Allergies  Social History   Tobacco Use  . Smoking status: Never Smoker  . Smokeless tobacco: Never Used  Substance Use Topics  . Alcohol use: Not Currently    History reviewed. No pertinent family history.   Review of Systems  Musculoskeletal: Positive for joint pain.  All other systems reviewed and are negative.   Objective:  Physical Exam  Constitutional: She is oriented to person, place, and time. She appears well-developed and well-nourished.  HENT:  Head: Normocephalic and atraumatic.  Eyes: Pupils are equal, round, and reactive to light. EOM are normal.  Neck: Normal range of motion. Neck supple.  Cardiovascular: Normal rate.  Respiratory: Effort normal.  GI: Soft.  Musculoskeletal:     Left hip: She exhibits decreased range of motion, decreased strength, tenderness and bony tenderness.  Neurological: She is alert and oriented to person, place, and time.  Skin: Skin is warm and dry.  Psychiatric: She has a normal mood and affect.    Vital signs in last 24 hours: Temp:  [98.1 F (36.7 C)] 98.1 F (  36.7 C) (03/03 1035) Pulse Rate:  [96] 96 (03/03 1035) Resp:  [20] 20 (03/03 1035) BP: (131)/(59) 131/59 (03/03 1035) SpO2:  [96 %] 96 % (03/03 1035) Weight:  [99.8 kg-101.6 kg] 99.8 kg (03/03 1052)  Labs:   Estimated body mass index is 40.24 kg/m as calculated from the following:   Height as of this encounter: 5\' 2"  (1.575 m).   Weight as of this encounter: 99.8 kg.   Imaging Review Plain radiographs demonstrate severe degenerative joint disease of the left  hip(s). The bone quality appears to be good for age and reported activity level.      Assessment/Plan:  End stage arthritis, left hip(s)  The patient history, physical examination, clinical judgement of the provider and imaging studies are consistent with end stage degenerative joint disease of the left hip(s) and total hip arthroplasty is deemed medically necessary. The treatment options including medical management, injection therapy, arthroscopy and arthroplasty were discussed at length. The risks and benefits of total hip arthroplasty were presented and reviewed. The risks due to aseptic loosening, infection, stiffness, dislocation/subluxation,  thromboembolic complications and other imponderables were discussed.  The patient acknowledged the explanation, agreed to proceed with the plan and consent was signed. Patient is being admitted for inpatient treatment for surgery, pain control, PT, OT, prophylactic antibiotics, VTE prophylaxis, progressive ambulation and ADL's and discharge planning.The patient is planning to be discharged home with home health services

## 2018-10-30 NOTE — Transfer of Care (Signed)
Immediate Anesthesia Transfer of Care Note  Patient: Kaylee Salas  Procedure(s) Performed: LEFT TOTAL HIP ARTHROPLASTY ANTERIOR APPROACH (Left Hip)  Patient Location: PACU  Anesthesia Type:MAC  Level of Consciousness: drowsy and patient cooperative  Airway & Oxygen Therapy: Patient Spontanous Breathing and Patient connected to nasal cannula oxygen  Post-op Assessment: Report given to RN and Post -op Vital signs reviewed and stable  Post vital signs: Reviewed and stable  Last Vitals:  Vitals Value Taken Time  BP    Temp    Pulse 76 10/30/2018  3:25 PM  Resp 17 10/30/2018  3:25 PM  SpO2 98 % 10/30/2018  3:25 PM  Vitals shown include unvalidated device data.  Last Pain:  Vitals:   10/30/18 1051  TempSrc:   PainSc: 5          Complications: No apparent anesthesia complications   Pt remained on Neo drip, Dr. Glennon Mac aware

## 2018-10-30 NOTE — Anesthesia Preprocedure Evaluation (Addendum)
Anesthesia Evaluation  Patient identified by MRN, date of birth, ID band Patient awake    Reviewed: Allergy & Precautions, NPO status , Patient's Chart, lab work & pertinent test results  History of Anesthesia Complications (+) PONV and Emergence Delirium  Airway Mallampati: II  TM Distance: >3 FB Neck ROM: Full    Dental  (+) Dental Advisory Given   Pulmonary neg pulmonary ROS,    breath sounds clear to auscultation       Cardiovascular negative cardio ROS   Rhythm:Regular Rate:Normal     Neuro/Psych Anxiety Depression Chronic back pain    GI/Hepatic Neg liver ROS, GERD  Medicated and Poorly Controlled,S/p gastric bypass   Endo/Other  Morbid obesity  Renal/GU negative Renal ROS     Musculoskeletal  (+) Arthritis , Osteoarthritis,    Abdominal (+) + obese,   Peds  Hematology negative hematology ROS (+)   Anesthesia Other Findings   Reproductive/Obstetrics Post-menopausal                            Anesthesia Physical Anesthesia Plan  ASA: III  Anesthesia Plan: Spinal   Post-op Pain Management:    Induction:   PONV Risk Score and Plan: 4 or greater and Ondansetron, Dexamethasone and Treatment may vary due to age or medical condition  Airway Management Planned: Natural Airway and Simple Face Mask  Additional Equipment:   Intra-op Plan:   Post-operative Plan:   Informed Consent: I have reviewed the patients History and Physical, chart, labs and discussed the procedure including the risks, benefits and alternatives for the proposed anesthesia with the patient or authorized representative who has indicated his/her understanding and acceptance.     Dental advisory given  Plan Discussed with: CRNA and Surgeon  Anesthesia Plan Comments: (Plan routine monitors, SAB)       Anesthesia Quick Evaluation

## 2018-10-30 NOTE — Op Note (Signed)
NAME: Kaylee Salas, NISSAN MEDICAL RECORD [id number] ACCOUNT [id number] DATE OF BIRTH:24-Aug-1966 FACILITY: [facility] LOCATION: [facility] PHYSICIAN:Morrison Masser Kerry Fort, MD  OPERATIVE REPORT  DATE OF PROCEDURE:  10/30/2018  PREOPERATIVE DIAGNOSIS:  Primary osteoarthritis and degenerative joint disease, left hip.  POSTOPERATIVE DIAGNOSIS:  Primary osteoarthritis and degenerative joint disease, left hip.  PROCEDURE:  Left total hip arthroplasty through direct anterior approach.  IMPLANTS:  DePuy Sector Gription acetabular component size 52, size 36+0 polyethylene liner, size 12 Corail femoral component with high offset, size 36+5 metal hip ball.  SURGEON:  Lind Guest. Ninfa Linden, MD  ASSISTANT:  Erskine Emery, PA-C  ANESTHESIA:  Spinal.  ANTIBIOTICS:  Two grams IV Ancef.  ESTIMATED BLOOD LOSS:  485 mL  COMPLICATIONS:  None.  INDICATIONS:  The patient is a 53 year old morbidly obese female with a BMI of 40, but also debilitating arthritis involving her left hip.  Her x-rays showed complete loss of joint space.  Her right hip is normal.  Her left hip pain has detrimentally  affected her mobility, quality of life and activities of daily living to the point she does wish to proceed with total hip arthroplasty.  I did see her in the office and examined her thoroughly to see if I was comfortable with proceeding with surgery in  light of her morbid obesity.  She used to be quite a bit small, but due to trauma in her life and psychosocial issues, she gained a lot of weight.  She has been now losing a significant amount and heading in the right direction.  I do feel a total hip  arthroplasty could help her succeed with that goal.  She understands fully with her obesity there is a heightened risk of acute blood loss anemia, nerve or vessel injury, fracture, infection, DVT, dislocation, implant failure.  She understands her goals  are to decrease pain, improve mobility and overall improve quality of  life.  DESCRIPTION OF PROCEDURE:  After informed consent was obtained and appropriate left hip was marked, she was brought to the operating room and placed supine on the operating table.  General anesthesia was then obtained.  Her left hip was prepped and  draped with DuraPrep and sterile drapes.  A time-out was called to identify correct patient, correct left hip.  We then made an incision just inferior and posterior to the anterior superior iliac spine and carried this obliquely down the leg.  We  dissected down tensor fascia lata muscle.  Tensor fascia was then divided longitudinally to proceed with direct anterior approach to the hip.  We identified and cauterized circumflex vessels.  I then identified the hip capsule, opened the hip capsule in  an L-type format finding a moderate joint effusion and significant periarticular osteophytes and arthritis around her left hip.  We then placed Cobra retractors around the medial and lateral femoral neck and made our femoral neck cut with an oscillating  saw and completed this with an osteotome.  We placed a corkscrew guide in the femoral head and removed the femoral heads entirety and found it to be devoid of cartilage.  We then placed a bent Hohmann over the medial acetabular rim and removed remnants  of the acetabular labrum and other debris from the hip.  I then began reaming from a size 44 reamer in stepwise increments up to a size 51 with all reamers under direct visualization, the last reamer under direct fluoroscopy, so we could obtain our depth  of reaming, our inclination and anteversion.  I  then placed the real DePuy Sector Gription acetabular component size 52 and a 36+0 neutral polyethylene liner for that size acetabular component.  Attention was then turned to the femur.  With the leg  externally rotated to 120 degrees, extended and adducted, we were able to place a Mueller retractor medially and Hohman retractor behind the greater trochanter,  released lateral joint capsule and used a box-cutting osteotome to enter femoral canal and a  rongeur to lateralize then began broaching using the Corail broaching system from a size 8 broach, going up to a size 12.  With a size 12 in place, we trialed a standard offset femoral neck and 36+15 hip ball, reduced this in the acetabulum.  We  definitely had more offset and leg length.  We dislocated the hip and removed the trial components.  We then placed the real Corail femoral component with high offset size 12 and the real 36+5 metal hip ball, reduced this in the acetabulum.  We were  pleased with stability, especially given the fact that she is morbidly obese.  Of note, she had a longer leg length preoperatively on this operative side than her other side flexion contracture and deformity on her other side during ACL reconstruction.   We then irrigated the soft tissue with normal saline solution using pulsatile lavage.  We closed the joint capsule with interrupted #1 Ethibond suture, followed by running 0 Vicryl and tensor fascia, 0 Vicryl in the deep tissue, 2-0 Vicryl subcutaneous  tissue and interrupted staples on the skin.  Xeroform and Aquacel dressing was applied.  She was taken off the Hana table and taken to recovery room in stable condition.  All final counts were correct.  There were no complications noted.  Of note, Benita Stabile, PA-C, assisted the entire case.  His assistance was crucial for facilitating all aspects of this case.  TN/NUANCE  D:10/30/2018 T:10/30/2018 JOB:[id number]

## 2018-10-30 NOTE — Brief Op Note (Signed)
10/30/2018  2:50 PM  PATIENT:  Kaylee Salas  53 y.o. female  PRE-OPERATIVE DIAGNOSIS:  osteoarthritis left hip  POST-OPERATIVE DIAGNOSIS:  osteoarthritis left hip  PROCEDURE:  Procedure(s): LEFT TOTAL HIP ARTHROPLASTY ANTERIOR APPROACH (Left)  SURGEON:  Surgeon(s) and Role:    Mcarthur Rossetti, MD - Primary  PHYSICIAN ASSISTANT: Benita Stabile, PA-C  ANESTHESIA:   spinal  EBL:  300 mL   COUNTS:  YES  DICTATION: .Other Dictation: Dictation Number [id number]  PLAN OF CARE: Admit to inpatient   PATIENT DISPOSITION:  PACU - hemodynamically stable.   Delay start of Pharmacological VTE agent (>24hrs) due to surgical blood loss or risk of bleeding: no

## 2018-10-30 NOTE — Progress Notes (Signed)
Pt requested for anxiety med, usually takes Xanax or Klonopin at home. Called MD's office, received callback from Dr. Lorin Mercy, obtained verbal order for 0.25mg  Xanax once. Explained to Pt that per MD's order, she would have to either take Xanax or Robaxin d/t her soft BP, she currently has orders for PRN oxycodone 5-10mg , Robaxin 500mg  and  s/p L total hip arthroplasty. Pt states understanding, decided to take Robaxin and hold off taking Xanax. Endorsed accordingly to oncoming RN.

## 2018-10-31 ENCOUNTER — Encounter (HOSPITAL_COMMUNITY): Payer: Self-pay | Admitting: Orthopaedic Surgery

## 2018-10-31 ENCOUNTER — Other Ambulatory Visit: Payer: Self-pay

## 2018-10-31 LAB — BASIC METABOLIC PANEL
Anion gap: 11 (ref 5–15)
BUN: 12 mg/dL (ref 6–20)
CHLORIDE: 102 mmol/L (ref 98–111)
CO2: 25 mmol/L (ref 22–32)
Calcium: 8.5 mg/dL — ABNORMAL LOW (ref 8.9–10.3)
Creatinine, Ser: 0.88 mg/dL (ref 0.44–1.00)
GFR calc Af Amer: >60 mL/min (ref >60)
GFR calc non Af Amer: >60 mL/min (ref >60)
Glucose, Bld: 142 mg/dL — ABNORMAL HIGH (ref 70–99)
Potassium: 4.6 mmol/L (ref 3.5–5.1)
Sodium: 138 mmol/L (ref 135–145)

## 2018-10-31 LAB — CBC
HCT: 32.2 % — ABNORMAL LOW (ref 36.0–46.0)
Hemoglobin: 10.4 g/dL — ABNORMAL LOW (ref 12.0–15.0)
MCH: 30 pg (ref 26.0–34.0)
MCHC: 32.3 g/dL (ref 30.0–36.0)
MCV: 92.8 fL (ref 80.0–100.0)
Platelets: 265 10*3/uL (ref 150–400)
RBC: 3.47 MIL/uL — ABNORMAL LOW (ref 3.87–5.11)
RDW: 12.5 % (ref 11.5–15.5)
WBC: 12.7 10*3/uL — AB (ref 4.0–10.5)
nRBC: 0 % (ref 0.0–0.2)

## 2018-10-31 MED ORDER — METHOCARBAMOL 500 MG PO TABS: 500.0000 mg | ORAL_TABLET | Freq: Four times a day (QID) | ORAL | 0 refills | Status: DC | PRN

## 2018-10-31 MED ORDER — OXYCODONE HCL 5 MG PO TABS: 5.0000 mg | ORAL_TABLET | ORAL | 0 refills | Status: DC | PRN

## 2018-10-31 MED ORDER — ASPIRIN 81 MG PO CHEW: 81.0000 mg | CHEWABLE_TABLET | Freq: Two times a day (BID) | ORAL | 0 refills | Status: DC

## 2018-10-31 NOTE — NC FL2 (Signed)
[place] LEVEL OF CARE SCREENING TOOL     IDENTIFICATION  Patient Name: Kaylee Salas Birthdate: [birth year] Sex: female Admission Date (Current Location): 10/30/2018  [place] and [place] Number:  Angelina Sheriff, [place])   Facility and Address:  The . [facility], [place] [address] [place], [place] [address]      Provider Number: [id number]  Attending Physician Name and Address:  Mcarthur Rossetti  Relative Name and Phone Number:  none provided    Current Level of Care: Hospital Recommended Level of Care: [place] Prior Approval Number:    Date Approved/Denied:   PAS[facility] [place] ([facility]  Discharge Plan: SNF    Current Diagnoses: Patient Active Problem List   Diagnosis Date Noted  . Unilateral primary osteoarthritis, left hip 10/30/2018  . Status post total replacement of left hip 10/30/2018    Orientation RESPIRATION BLADDER Height & Weight     Self, Time, Situation, Place  O2(nasal cannula 2L/min) Continent, External catheter Weight: 99.8 kg Height:  5\' 2"  (157.5 cm)  BEHAVIORAL SYMPTOMS/MOOD NEUROLOGICAL BOWEL NUTRITION STATUS      Continent Diet(see discharge summary)  AMBULATORY STATUS COMMUNICATION OF NEEDS Skin   Limited Assist Verbally Surgical wounds(left hip closed surgical incision)                       Personal Care Assistance Level of Assistance  Bathing, Feeding, Dressing, Total care Bathing Assistance: Independent Feeding assistance: Independent Dressing Assistance: Independent Total Care Assistance: Limited assistance   Functional Limitations Info  Sight, Hearing, Speech Sight Info: Adequate Hearing Info: Adequate Speech Info: Adequate    SPECIAL CARE FACTORS FREQUENCY  PT (By licensed PT), OT (By licensed OT)     PT Frequency: min 5x weekly OT Frequency: min 5x weekly            Contractures Contractures Info: Not present    Additional Factors Info  Code Status, Allergies  Code Status Info: full Allergies Info: no known allergies           Current Medications (10/31/2018):  This is the current hospital active medication list Current Facility-Administered Medications  Medication Dose Route Frequency Provider Last Rate Last Dose  . 0.9 %  sodium chloride infusion   Intravenous Continuous Mcarthur Rossetti, MD 75 mL/hr at 10/30/18 1807    . acetaminophen (TYLENOL) tablet 325-650 mg  325-650 mg Oral Q6H PRN Mcarthur Rossetti, MD      . ALPRAZolam Duanne Moron) tablet 0.25 mg  0.25 mg Oral Once Marybelle Killings, MD      . alum & mag hydroxide-simeth (MAALOX/MYLANTA) 200-200-20 MG/5ML suspension 30 mL  30 mL Oral Q4H PRN Mcarthur Rossetti, MD      . aspirin chewable tablet 81 mg  81 mg Oral BID Mcarthur Rossetti, MD   81 mg at 10/31/18 7858  . diphenhydrAMINE (BENADRYL) 12.5 MG/5ML elixir 12.5-25 mg  12.5-25 mg Oral Q4H PRN Mcarthur Rossetti, MD      . docusate sodium (COLACE) capsule 100 mg  100 mg Oral BID Mcarthur Rossetti, MD   100 mg at 10/31/18 0941  . DULoxetine (CYMBALTA) DR capsule 60 mg  60 mg Oral Daily Mcarthur Rossetti, MD   60 mg at 10/31/18 0940  . famotidine (PEPCID) tablet 10 mg  10 mg Oral BID Mcarthur Rossetti, MD   10 mg at 10/31/18 0940  . gabapentin (NEURONTIN) capsule 100 mg  100 mg Oral  TID Mcarthur Rossetti, MD   100 mg at 10/31/18 0939  . HYDROmorphone (DILAUDID) injection 0.5-1 mg  0.5-1 mg Intravenous Q2H PRN Mcarthur Rossetti, MD   1 mg at 10/31/18 1610  . hydrOXYzine (ATARAX/VISTARIL) tablet 25 mg  25 mg Oral BID Mcarthur Rossetti, MD   25 mg at 10/31/18 9604  . hyoscyamine (LEVSIN SL) SL tablet 0.125 mg  0.125 mg Sublingual Q4H PRN Mcarthur Rossetti, MD      . menthol-cetylpyridinium (CEPACOL) lozenge 3 mg  1 lozenge Oral PRN Mcarthur Rossetti, MD       Or  . phenol (CHLORASEPTIC) mouth spray 1 spray  1 spray Mouth/Throat PRN Mcarthur Rossetti, MD      .  methocarbamol (ROBAXIN) tablet 500 mg  500 mg Oral Q6H PRN Mcarthur Rossetti, MD   500 mg at 10/31/18 0039   Or  . methocarbamol (ROBAXIN) 500 mg in dextrose 5 % 50 mL IVPB  500 mg Intravenous Q6H PRN Mcarthur Rossetti, MD      . metoCLOPramide (REGLAN) tablet 5-10 mg  5-10 mg Oral Q8H PRN Mcarthur Rossetti, MD       Or  . metoCLOPramide (REGLAN) injection 5-10 mg  5-10 mg Intravenous Q8H PRN Mcarthur Rossetti, MD      . ondansetron [facility]) tablet 4 mg  4 mg Oral Q6H PRN Mcarthur Rossetti, MD       Or  . ondansetron [facility]) injection 4 mg  4 mg Intravenous Q6H PRN Mcarthur Rossetti, MD   4 mg at 10/30/18 1628  . oxyCODONE (Oxy IR/ROXICODONE) immediate release tablet 10-15 mg  10-15 mg Oral Q3H PRN Mcarthur Rossetti, MD   15 mg at 10/31/18 0941  . oxyCODONE (Oxy IR/ROXICODONE) immediate release tablet 5-10 mg  5-10 mg Oral Q3H PRN Mcarthur Rossetti, MD   10 mg at 10/30/18 1726  . polyethylene glycol (MIRALAX / GLYCOLAX) packet 17 g  17 g Oral Daily PRN Mcarthur Rossetti, MD      . rOPINIRole (REQUIP) tablet 1 mg  1 mg Oral QID Mcarthur Rossetti, MD   1 mg at 10/31/18 0940  . sucralfate (CARAFATE) tablet 1 g  1 g Oral TID WC & HS Mcarthur Rossetti, MD   1 g at 10/31/18 5409  . zolpidem (AMBIEN) tablet 5 mg  5 mg Oral QHS PRN Mcarthur Rossetti, MD         Discharge Medications: Please see discharge summary for a list of discharge medications.  Relevant Imaging Results:  Relevant Lab Results:   Additional Information SSN: [id number]  Alberteen Sam, LCSW

## 2018-10-31 NOTE — Progress Notes (Signed)
Subjective: 1 Day Post-Op Procedure(s) (LRB): LEFT TOTAL HIP ARTHROPLASTY ANTERIOR APPROACH (Left) Patient reports pain as moderate.  Very sleepy this am.  Objective: Vital signs in last 24 hours: Temp:  [97.3 F (36.3 C)-98.2 F (36.8 C)] 97.9 F (36.6 C) (03/04 0806) Pulse Rate:  [51-112] 89 (03/04 0806) Resp:  [14-20] 17 (03/04 0806) BP: (59-131)/(35-81) 116/64 (03/04 0806) SpO2:  [91 %-99 %] 91 % (03/04 0806) Weight:  [99.8 kg-101.6 kg] 99.8 kg (03/03 1052)  Intake/Output from previous day: 03/03 0701 - 03/04 0700 In: 1500 [I.V.:1500] Out: 525 [Urine:225; Blood:300] Intake/Output this shift: No intake/output data recorded.  Recent Labs    10/31/18 0414  HGB 10.4*   Recent Labs    10/31/18 0414  WBC 12.7*  RBC 3.47*  HCT 32.2*  PLT 265   Recent Labs    10/31/18 0414  NA 138  K 4.6  CL 102  CO2 25  BUN 12  CREATININE 0.88  GLUCOSE 142*  CALCIUM 8.5*   No results for input(s): LABPT, INR in the last 72 hours.  Intact pulses distally Dorsiflexion/Plantar flexion intact Incision: scant drainage   Assessment/Plan: 1 Day Post-Op Procedure(s) (LRB): LEFT TOTAL HIP ARTHROPLASTY ANTERIOR APPROACH (Left) Up with therapy Discharge to SNF by Friday (lives alone and no support)      Mcarthur Rossetti 10/31/2018, 8:08 AM

## 2018-10-31 NOTE — Care Management Note (Signed)
Case Management Note  Patient Details  Name: Kaylee Salas MRN: [id number] Date of Birth: 1966-08-14  Subjective/Objective:                    Action/Plan:  Spoke w patient at bedside. Discussed dispo plans. SHe states that she lives in an old house with lots of stairs w/o handrails, lives alone, wants to DC to SNF in [place]. Discussed Medicaid and payment briefly. She states she does not have SSI check she would have to give up and going to SNF would not be a financial problem. CM discussed w CSW. CSW will follow up with placement.   Expected Discharge Date:                  Expected Discharge Plan:  Skilled Nursing Facility  In-House Referral:  Clinical Social Work  Discharge planning Services  CM Consult  Post Acute Care Choice:    Choice offered to:     DME Arranged:    DME Agency:     HH Arranged:    [place] Agency:     Status of Service:  In process, will continue to follow  If discussed at Long Length of Stay Meetings, dates discussed:    Additional Comments:  Carles Collet, RN 10/31/2018, 11:03 AM

## 2018-10-31 NOTE — Evaluation (Signed)
Occupational Therapy Evaluation Patient Details Name: Kaylee Salas MRN: [id number] DOB: 1966-04-01 Today's Date: 10/31/2018    History of Present Illness Patient is 53 y/o female s/p L THA. PMH includes anxiety, gastric bypass, and R knee arthoscopy with ACL reconstruction.    Clinical Impression   Patient is s/p L direct anterior THR surgery resulting in functional limitations due to the deficits listed below (see OT problem list). Pt currently needs safety cues mod (A) for use of RW due to abandoning DME. Pt requesting to use crutches and demonstrates poor use by swinging on the crutches. Pt reports R shoulder discomfort and encouraged to avoid crutches and use RW.  Patient will benefit from skilled OT acutely to increase independence and safety with ADLS to allow discharge MD recommends are SNF.     Follow Up Recommendations  Follow surgeon's recommendation for DC plan and follow-up therapies    Equipment Recommendations  Other (comment)(RW)    Recommendations for Other Services       Precautions / Restrictions Precautions Precautions: Fall Restrictions Weight Bearing Restrictions: Yes LLE Weight Bearing: Weight bearing as tolerated      Mobility Bed Mobility Overal bed mobility: Modified Independent                Transfers Overall transfer level: Needs assistance Equipment used: Rolling walker (2 wheeled) Transfers: Sit to/from Stand Sit to Stand: Supervision         General transfer comment: cues for safety with RW    Balance Overall balance assessment: Needs assistance         Standing balance support: Single extremity supported;During functional activity Standing balance-Leahy Scale: Good                             ADL either performed or assessed with clinical judgement   ADL Overall ADL's : Needs assistance/impaired Eating/Feeding: Independent   Grooming: Wash/dry hands;Wash/dry face;Oral care;Modified independent;Standing    Upper Body Bathing: Independent   Lower Body Bathing: Supervison/ safety;Sit to/from stand   Upper Body Dressing : Independent   Lower Body Dressing: Supervision/safety;Sit to/from stand   Toilet Transfer: Supervision/safety;RW   Toileting- Water quality scientist and Hygiene: Supervision/safety;Sit to/from stand       Functional mobility during ADLs: Supervision/safety;Rolling walker General ADL Comments: pt using R UE for support on sink for static standing to complete LB peri care     Vision         Perception     Praxis      Pertinent Vitals/Pain Pain Assessment: Faces Faces Pain Scale: Hurts a little bit Pain Location: L hip Pain Descriptors / Indicators: Sore Pain Intervention(s): Limited activity within patient's tolerance;Repositioned;Premedicated before session     Hand Dominance Right   Extremity/Trunk Assessment Upper Extremity Assessment Upper Extremity Assessment: Overall WFL for tasks assessed       Cervical / Trunk Assessment Cervical / Trunk Assessment: Normal   Communication Communication Communication: No difficulties   Cognition Arousal/Alertness: Awake/alert Behavior During Therapy: WFL for tasks assessed/performed Overall Cognitive Status: Within Functional Limits for tasks assessed                                     General Comments  dresssing intact    Exercises     Shoulder Instructions      Home Living Family/patient expects to be discharged to:: Skilled  nursing facility Living Arrangements: Alone                                      Prior Functioning/Environment Level of Independence: Independent                 OT Problem List: Decreased strength;Decreased activity tolerance;Impaired balance (sitting and/or standing);Decreased safety awareness;Decreased knowledge of use of DME or AE;Decreased knowledge of precautions;Pain;Obesity      OT Treatment/Interventions: Therapeutic  exercise;Self-care/ADL training;DME and/or AE instruction;Therapeutic activities;Patient/family education;Balance training    OT Goals(Current goals can be found in the care plan section) Acute Rehab OT Goals Patient 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 OT Goal Formulation: With patient Time For Goal Achievement: 11/14/18 Potential to Achieve Goals: Good  OT Frequency: Min 2X/week   Barriers to D/C: Decreased caregiver support          Co-evaluation              AM-PAC OT "6 Clicks" Daily Activity     Outcome Measure Help from another person eating meals?: None Help from another person taking care of personal grooming?: None Help from another person toileting, which includes using toliet, bedpan, or urinal?: None Help from another person bathing (including washing, rinsing, drying)?: None Help from another person to put on and taking off regular upper body clothing?: None Help from another person to put on and taking off regular lower body clothing?: A Little 6 Click Score: 23   End of Session Equipment Utilized During Treatment: Gait belt;Rolling walker Nurse Communication: Mobility status;Precautions  Activity Tolerance: Patient tolerated treatment well Patient left: in bed;with call bell/phone within reach;with bed alarm set  OT Visit Diagnosis: Unsteadiness on feet (R26.81);Muscle weakness (generalized) (M62.81)                Time: 9983-3825 OT Time Calculation (min): 43 min Charges:  OT General Charges $OT Visit: 1 Visit OT Evaluation $OT Eval Moderate Complexity: 1 Mod OT Treatments $Self Care/Home Management : 23-37 mins   Jeri Modena, OTR/L  Acute Rehabilitation Services Pager: [phone] Office: [phone] .   Jeri Modena 10/31/2018, 3:40 PM

## 2018-10-31 NOTE — Clinical Social Work Note (Signed)
Clinical Social Work Assessment  Patient Details  Name: Kaylee Salas MRN: [id number] Date of Birth: 22-Sep-1965  Date of referral:  10/31/18               Reason for consult:  Discharge Planning                Permission sought to share information with:  Case Manager, Facility Sport and exercise psychologist, Family Supports Permission granted to share information::  Yes, Verbal Permission Granted  Name::        Agency::  [facility]  Relationship::     Contact Information:     Housing/Transportation Living arrangements for the past 2 months:  Single Family Home Source of Information:  Patient Patient Interpreter Needed:  None Criminal Activity/Legal Involvement Pertinent to Current Situation/Hospitalization:  No - Comment as needed Significant Relationships:  None Lives with:  Self Do you feel safe going back to the place where you live?  No Need for family participation in patient care:  No (Coment)  Care giving concerns:  CSW received referral for possible SNF placement at time of discharge. Spoke with patient regarding possibility of SNF placement . Patient has no family are friends that can currently care for her at their home given patient's current needs and fall risk.  Patient expressed understanding of PT recommendation and are agreeable to SNF placement at time of discharge. CSW to continue to follow and assist with discharge planning needs.     Social Worker assessment / plan:  Spoke with patient concerning possibility of rehab at SNF before returning home.    Employment status:  Retired Forensic scientist:  [employer]ater[employer] PT Recommendations:  [place] / Referral to community resources:  [place]  Patient/Family's Response to care:  Patient recognizes need for rehab before returning home and are agreeable to a SNF in [place]. They report preference for [facility]   . CSW explained insurance authorization process. Patient's  family reported that they want patient to get stronger to be able to come back home.    Patient/Family's Understanding of and Emotional Response to Diagnosis, Current Treatment, and Prognosis:  Patient/family is realistic regarding therapy needs and expressed being hopeful for SNF placement. Patient expressed understanding of CSW role and discharge process as well as medical condition. No questions/concerns about plan or treatment.    Emotional Assessment Appearance:  Appears stated age Attitude/Demeanor/Rapport:  Gracious Affect (typically observed):  Accepting Orientation:  Oriented to Self, Oriented to Place, Oriented to  Time, Oriented to Situation Alcohol / Substance use:  Not Applicable Psych involvement (Current and /or in the community):  No (Comment)  Discharge Needs  Concerns to be addressed:  Discharge Planning Concerns Readmission within the last 30 days:  No Current discharge risk:  Dependent with Mobility Barriers to Discharge:  Continued Medical Work up   [employer], [place] 10/31/2018, 2:55 PM

## 2018-10-31 NOTE — Progress Notes (Signed)
Physical Therapy Treatment Patient Details Name: Kaylee Salas MRN: [id number] DOB: 09-27-1965 Today's Date: 10/31/2018    History of Present Illness Patient is 53 y/o female s/p L THA. PMH includes anxiety, gastric bypass, and R knee arthoscopy with ACL reconstruction.     PT Comments    Patient seen for mobility progression. Pt is progressing well toward PT goals and requires min guard assist for OOB mobility. Pt does not demonstrate great safety awareness and needs cues for safety with mobility. Continue to progress as tolerated.    Follow Up Recommendations  Follow surgeon's recommendation for DC plan and follow-up therapies     Equipment Recommendations  Other (comment)(TBD at next venue)    Recommendations for Other Services       Precautions / Restrictions Precautions Precautions: Fall Restrictions Weight Bearing Restrictions: Yes LLE Weight Bearing: Weight bearing as tolerated    Mobility  Bed Mobility Overal bed mobility: Modified Independent Bed Mobility: Supine to Sit           General bed mobility comments: increased time and effort  Transfers Overall transfer level: Needs assistance Equipment used: Rolling walker (2 wheeled) Transfers: Sit to/from Stand Sit to Stand: Min guard         General transfer comment: cues for safety  Ambulation/Gait Ambulation/Gait assistance: Min guard Gait Distance (Feet): 120 Feet Assistive device: Rolling walker (2 wheeled) Gait Pattern/deviations: Decreased stride length;Decreased weight shift to left;Antalgic;Step-through pattern Gait velocity: decreased   General Gait Details: cues for maintaining safe proximity to RW and for upright posture   Stairs             Wheelchair Mobility    Modified Rankin (Stroke Patients Only)       Balance Overall balance assessment: Needs assistance Sitting-balance support: No upper extremity supported;Feet supported Sitting balance-Leahy Scale: Good      Standing balance support: Bilateral upper extremity supported Standing balance-Leahy Scale: Poor                              Cognition Arousal/Alertness: Awake/alert Behavior During Therapy: WFL for tasks assessed/performed Overall Cognitive Status: Within Functional Limits for tasks assessed                                 General Comments: very drowsy initially       Exercises Total Joint Exercises Heel Slides: AROM;Both;10 reps;Supine Hip ABduction/ADduction: AROM;Both;10 reps;Standing Marching in Standing: AROM;Both;10 reps;Standing    General Comments        Pertinent Vitals/Pain Pain Assessment: 0-10 Pain Score: 3  Pain Location: L hip Pain Descriptors / Indicators: Sore Pain Intervention(s): Limited activity within patient's tolerance;Monitored during session;Premedicated before session;Repositioned    Home Living                      Prior Function            PT Goals (current goals can now be found in the care plan section) Progress towards PT goals: Progressing toward goals    Frequency    7X/week      PT Plan Current plan remains appropriate    Co-evaluation              AM-PAC PT "6 Clicks" Mobility   Outcome Measure  Help needed turning from your back to your side while in a flat bed without  using bedrails?: None Help needed moving from lying on your back to sitting on the side of a flat bed without using bedrails?: A Little Help needed moving to and from a bed to a chair (including a wheelchair)?: A Little Help needed standing up from a chair using your arms (e.g., wheelchair or bedside chair)?: A Little Help needed to walk in hospital room?: A Little Help needed climbing 3-5 steps with a railing? : A Little 6 Click Score: 19    End of Session Equipment Utilized During Treatment: Gait belt Activity Tolerance: Patient tolerated treatment well Patient left: with call bell/phone within reach;in  chair Nurse Communication: Mobility status PT Visit Diagnosis: Muscle weakness (generalized) (M62.81);Difficulty in walking, not elsewhere classified (R26.2);Other abnormalities of gait and mobility (R26.89)     Time: 0240-9735 PT Time Calculation (min) (ACUTE ONLY): 39 min  Charges:  $Gait Training: 23-37 mins $Therapeutic Exercise: 8-22 mins                     Earney Navy, PTA Acute Rehabilitation Services Pager: [phone] Office: [phone]     Darliss Cheney 10/31/2018, 9:09 AM

## 2018-10-31 NOTE — Discharge Instructions (Signed)

## 2018-11-01 MED ORDER — SENNA 8.6 MG PO TABS
1.0000 | ORAL_TABLET | Freq: Once | ORAL | Status: AC
  Administered 2018-11-01: 8.6 mg via ORAL
  Filled 2018-11-01: qty 1

## 2018-11-01 MED ORDER — BISACODYL 10 MG RE SUPP
10.0000 mg | Freq: Once | RECTAL | Status: DC
  Filled 2018-11-01: qty 1

## 2018-11-01 MED ORDER — HYDROMORPHONE HCL 1 MG/ML IJ SOLN
1.0000 mg | Freq: Once | INTRAMUSCULAR | Status: AC
  Administered 2018-11-01: 1 mg via INTRAVENOUS

## 2018-11-01 NOTE — Progress Notes (Signed)
Physical Therapy Treatment Patient Details Name: Kaylee Salas MRN: [id number] DOB: 1966-01-16 Today's Date: 11/01/2018    History of Present Illness Patient is 53 y/o female s/p L THA. PMH includes anxiety, gastric bypass, and R knee arthoscopy with ACL reconstruction.     PT Comments    Patient seen for mobility progression. Pt requires supervision/min guard for safety with OOB mobility and min guard/min A for stair training this session. Pt reports she is not sure of d/c plan so began stair training today. It is ideal that if pt is to d/c home she has supervision/assist 24 hours especially since she has stairs to enter home however pt reports that is not an option.  Continue to progress as tolerated.   Follow Up Recommendations  Follow surgeon's recommendation for DC plan and follow-up therapies     Equipment Recommendations  Rolling walker with 5" wheels;3in1 (PT)    Recommendations for Other Services       Precautions / Restrictions Precautions Precautions: Fall Restrictions Weight Bearing Restrictions: Yes LLE Weight Bearing: Weight bearing as tolerated    Mobility  Bed Mobility Overal bed mobility: Modified Independent             General bed mobility comments: use of rails and increased time/effort   Transfers Overall transfer level: Needs assistance Equipment used: Rolling walker (2 wheeled) Transfers: Sit to/from Stand Sit to Stand: Min guard         General transfer comment: cues for safe hand placement  Ambulation/Gait Ambulation/Gait assistance: supervision;Min guard Gait Distance (Feet): 100 Feet Assistive device: Rolling walker (2 wheeled) Gait Pattern/deviations: Step-through pattern;Decreased stride length;Decreased weight shift to left;Trunk flexed Gait velocity: decreased   General Gait Details: cues for safe use of AD and upright posture   Stairs Stairs: Yes Stairs assistance: Min assist;Min guard Stair Management: No rails;Step to  pattern;Forwards;With crutches Number of Stairs: 10 General stair comments: cues for sequencing and technique using crutches as pt does not have rails and and was using crutches prior to surgery    Wheelchair Mobility    Modified Rankin (Stroke Patients Only)       Balance Overall balance assessment: Needs assistance   Sitting balance-Leahy Scale: Good     Standing balance support: Single extremity supported;During functional activity Standing balance-Leahy Scale: Good                              Cognition Arousal/Alertness: Awake/alert Behavior During Therapy: WFL for tasks assessed/performed Overall Cognitive Status: Within Functional Limits for tasks assessed                                        Exercises      General Comments        Pertinent Vitals/Pain Pain Assessment: Faces Faces Pain Scale: Hurts little more Pain Location: L hip Pain Descriptors / Indicators: Sore Pain Intervention(s): Limited activity within patient's tolerance;Monitored during session;Repositioned;Premedicated before session    Home Living                      Prior Function            PT Goals (current goals can now be found in the care plan section) Acute Rehab PT Goals Patient 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 Progress towards PT goals: Progressing toward goals  Frequency    7X/week      PT Plan Current plan remains appropriate    Co-evaluation              AM-PAC PT "6 Clicks" Mobility   Outcome Measure  Help needed turning from your back to your side while in a flat bed without using bedrails?: None Help needed moving from lying on your back to sitting on the side of a flat bed without using bedrails?: A Little Help needed moving to and from a bed to a chair (including a wheelchair)?: A Little Help needed standing up from a chair using your arms (e.g., wheelchair or bedside chair)?: None Help needed to walk in  hospital room?: None Help needed climbing 3-5 steps with a railing? : A Little 6 Click Score: 21    End of Session Equipment Utilized During Treatment: Gait belt Activity Tolerance: Patient tolerated treatment well Patient left: in chair;with call bell/phone within reach Nurse Communication: Mobility status PT Visit Diagnosis: Muscle weakness (generalized) (M62.81);Difficulty in walking, not elsewhere classified (R26.2);Other abnormalities of gait and mobility (R26.89)     Time: 8299-3716 PT Time Calculation (min) (ACUTE ONLY): 43 min  Charges:  $Gait Training: 38-52 mins                     Earney Navy, PTA Acute Rehabilitation Services Pager: [phone] Office: [phone]     Darliss Cheney 11/01/2018, 1:44 PM

## 2018-11-01 NOTE — Progress Notes (Signed)
Patient ID: Kaylee Salas, female   DOB: 1966/08/07, 53 y.o.   MRN: [id number] I have checked on her and she is doing well.   Her left hip is stable.  I changed her dressing.  She will be able to go to short-term skilled nursing tomorrow 3/6.

## 2018-11-01 NOTE — Progress Notes (Addendum)
Pt c/o decreased movement and increased pain to LLE. Assessment noted and MD notfiied. New orders obtained and pt made aware. Pt placed on continuous O2 monitoring.

## 2018-11-01 NOTE — Progress Notes (Signed)
Orthopedic Tech Progress Note Patient Details:  Kaylee Salas 09-03-1965 [id number]  Patient ID: Kaylee Salas, female   DOB: [birth year], 53 y.o.   MRN: [id number] [facility]  [name] 11/01/2018, 4:03 PM

## 2018-11-01 NOTE — Progress Notes (Addendum)
Physical Therapy Treatment Patient Details Name: Kaylee Salas MRN: [id number] DOB: Dec 03, 1965 Today's Date: 11/01/2018    History of Present Illness Patient is 53 y/o female s/p L THA. PMH includes anxiety, gastric bypass, and R knee arthoscopy with ACL reconstruction.     PT Comments    Patient seen for mobility progression.  Upon arrival, pt was lying across bed with bilat LE off of R side of bed. Pt reports pain and that she heard a pop when attempting to get into bed. Pt required assistance to return to supine and position in bed. RN notified of pt's report. Pt educated to call for staff assistance for mobility. PT will continue to follow acutely.    Follow Up Recommendations  Follow surgeon's recommendation for DC plan and follow-up therapies     Equipment Recommendations  Rolling walker with 5" wheels;3in1 (PT)    Recommendations for Other Services      Precautions / Restrictions Precautions Precautions: Fall Restrictions Weight Bearing Restrictions: Yes LLE Weight Bearing: Weight bearing as tolerated    Mobility  Bed Mobility Overal bed mobility: Needs Assistance Bed Mobility: Sit to Supine       Sit to supine: Min assist   General bed mobility comments: pt lying on bed upon arrival with bilat LE off of R side of bed; pt reports she was trying to get into bed and then heard a pop at her L hip; pt required assistance to bring L LE into bed adn to position hips in neutral with bed pad  Transfers                    Ambulation/Gait                 Stairs             Wheelchair Mobility    Modified Rankin (Stroke Patients Only)       Balance Overall balance assessment: Needs assistance   Sitting balance-Leahy Scale: Good     Standing balance support: Single extremity supported;During functional activity Standing balance-Leahy Scale: Poor                              Cognition Arousal/Alertness: Awake/alert Behavior  During Therapy: WFL for tasks assessed/performed Overall Cognitive Status: Within Functional Limits for tasks assessed                                        Exercises Total Joint Exercises Ankle Circles/Pumps: AROM;Both Short Arc Quad: AROM;Left;10 reps;Supine    General Comments        Pertinent Vitals/Pain Pain Assessment: Faces Faces Pain Scale: Hurts even more Pain Location: L hip Pain Descriptors / Indicators: Sore Pain Intervention(s): Limited activity within patient's tolerance;Monitored during session;Repositioned;Premedicated before session;Other (comment)(pt asked about ice; ice was offered; pt declined ice)    Home Living                      Prior Function            PT Goals (current goals can now be found in the care plan section) Progress towards PT goals: Progressing toward goals    Frequency    7X/week      PT Plan Current plan remains appropriate    Co-evaluation  AM-PAC PT "6 Clicks" Mobility   Outcome Measure  Help needed turning from your back to your side while in a flat bed without using bedrails?: None Help needed moving from lying on your back to sitting on the side of a flat bed without using bedrails?: A Little Help needed moving to and from a bed to a chair (including a wheelchair)?: A Little Help needed standing up from a chair using your arms (e.g., wheelchair or bedside chair)?: None Help needed to walk in hospital room?: None Help needed climbing 3-5 steps with a railing? : A Little 6 Click Score: 21    End of Session   Activity Tolerance: Patient tolerated treatment well Patient left: with call bell/phone within reach;in bed Nurse Communication: Mobility status PT Visit Diagnosis: Muscle weakness (generalized) (M62.81);Difficulty in walking, not elsewhere classified (R26.2);Other abnormalities of gait and mobility (R26.89)     Time: 4196-2229 PT Time Calculation (min) (ACUTE  ONLY): 18 min  Charges:  $therapeutic activities: 8-22 mins                     Earney Navy, PTA Acute Rehabilitation Services Pager: [phone] Office: [phone]     Darliss Cheney 11/01/2018, 4:08 PM

## 2018-11-01 NOTE — Progress Notes (Signed)
Subjective: 2 Days Post-Op Procedure(s) (LRB): LEFT TOTAL HIP ARTHROPLASTY ANTERIOR APPROACH (Left) Patient reports pain as severe. Feels she "over did it yesterday'.  Complaining of constipation   Objective: Vital signs in last 24 hours: Temp:  [97.7 F (36.5 C)-98.7 F (37.1 C)] 97.7 F (36.5 C) (03/05 0840) Pulse Rate:  [81-107] 81 (03/05 0840) Resp:  [16-20] 16 (03/05 0344) BP: (94-117)/(49-78) 117/65 (03/05 0840) SpO2:  [97 %-99 %] 97 % (03/05 0840)  Intake/Output from previous day: 03/04 0701 - 03/05 0700 In: 480 [P.O.:480] Out: -  Intake/Output this shift: No intake/output data recorded.  Recent Labs    10/31/18 0414  HGB 10.4*   Recent Labs    10/31/18 0414  WBC 12.7*  RBC 3.47*  HCT 32.2*  PLT 265   Recent Labs    10/31/18 0414  NA 138  K 4.6  CL 102  CO2 25  BUN 12  CREATININE 0.88  GLUCOSE 142*  CALCIUM 8.5*   No results for input(s): LABPT, INR in the last 72 hours.  Left hip: Dorsiflexion/Plantar flexion intact Incision: dressing C/D/I Compartment soft   Assessment/Plan: 2 Days Post-Op Procedure(s) (LRB): LEFT TOTAL HIP ARTHROPLASTY ANTERIOR APPROACH (Left) Up with therapy  Will treat constipation .       Ory Elting 11/01/2018, 11:57 AM

## 2018-11-02 NOTE — Discharge Summary (Signed)
Patient ID: Kaylee Salas MRN: [id number] DOB/AGE: 1966/04/10 53 y.o.  Admit date: 10/30/2018 Discharge date: 11/02/2018  Admission Diagnoses:  Principal Problem:   Unilateral primary osteoarthritis, left hip Active Problems:   Status post total replacement of left hip   Discharge Diagnoses:  Same  Past Medical History:  Diagnosis Date  . Anxiety   . Arthritis   . Complication of anesthesia    "woke up panicking/fighting"  . Depression   . Unilateral primary osteoarthritis, left hip 10/30/2018    Surgeries: Procedure(s): LEFT TOTAL HIP ARTHROPLASTY ANTERIOR APPROACH on 10/30/2018   Consultants:   Discharged Condition: Improved  Hospital Course: Kaylee Salas is an 53 y.o. female who was admitted 10/30/2018 for operative treatment ofUnilateral primary osteoarthritis, left hip. Patient has severe unremitting pain that affects sleep, daily activities, and work/hobbies. After pre-op clearance the patient was taken to the operating room on 10/30/2018 and underwent  Procedure(s): LEFT TOTAL HIP ARTHROPLASTY ANTERIOR APPROACH.    Patient was given perioperative antibiotics:  Anti-infectives (From admission, onward)   Start     Dose/Rate Route Frequency Ordered Stop   10/30/18 1800  ceFAZolin (ANCEF) IVPB 1 g/50 mL premix     1 g 100 mL/hr over 30 Minutes Intravenous Every 6 hours 10/30/18 1753 10/31/18 0059   10/30/18 1030  ceFAZolin (ANCEF) IVPB 2g/100 mL premix     2 g 200 mL/hr over 30 Minutes Intravenous On call to O.R. 10/30/18 1017 10/30/18 1315       Patient was given sequential compression devices, early ambulation, and chemoprophylaxis to prevent DVT.  Patient benefited maximally from hospital stay and there were no complications.    Recent vital signs:  Patient Vitals for the past 24 hrs:  BP Temp Temp src Pulse Resp SpO2  11/02/18 0451 (!) 104/57 98.1 F (36.7 C) Oral 90 18 97 %  11/01/18 2258 - 98.3 F (36.8 C) Oral (!) 110 - -  11/01/18 2019 (!) 114/54 - - (!)  109 18 97 %  11/01/18 1545 (!) 117/55 98.3 F (36.8 C) Oral (!) 104 16 100 %  11/01/18 0840 117/65 97.7 F (36.5 C) Oral 81 - 97 %     Recent laboratory studies:  Recent Labs    10/31/18 0414  WBC 12.7*  HGB 10.4*  HCT 32.2*  PLT 265  NA 138  K 4.6  CL 102  CO2 25  BUN 12  CREATININE 0.88  GLUCOSE 142*  CALCIUM 8.5*     Discharge Medications:   Allergies as of 11/02/2018   No Known Allergies     Medication List    STOP taking these medications   traMADol 50 MG tablet Commonly known as:  ULTRAM     TAKE these medications   acetaminophen 500 MG tablet Commonly known as:  TYLENOL Take 500-1,000 mg by mouth every 8 (eight) hours as needed.   aspirin 81 MG chewable tablet Chew 1 tablet (81 mg total) by mouth 2 (two) times daily.   DULoxetine 60 MG capsule Commonly known as:  CYMBALTA Take 60 mg by mouth daily.   famotidine 10 MG tablet Commonly known as:  PEPCID Take 10 mg by mouth 2 (two) times daily.   gabapentin 100 MG capsule Commonly known as:  NEURONTIN Take 100 mg by mouth 3 (three) times daily.   hydrOXYzine 25 MG tablet Commonly known as:  ATARAX/VISTARIL Take 25 mg by mouth 2 (two) times daily.   hyoscyamine 0.125 MG tablet Commonly known as:  LEVSIN, ANASPAZ Take 0.125 mg by mouth every 4 (four) hours as needed for cramping.   methocarbamol 500 MG tablet Commonly known as:  ROBAXIN Take 1 tablet (500 mg total) by mouth every 6 (six) hours as needed for muscle spasms.   oxyCODONE 5 MG immediate release tablet Commonly known as:  Oxy IR/ROXICODONE Take 1-2 tablets (5-10 mg total) by mouth every 4 (four) hours as needed for moderate pain (pain score 4-6).   rOPINIRole 1 MG tablet Commonly known as:  REQUIP Take 1 mg by mouth 4 (four) times daily.   sucralfate 1 g tablet Commonly known as:  CARAFATE Take 1 g by mouth 4 (four) times daily -  with meals and at bedtime.            Durable Medical Equipment  (From admission, onward)          Start     Ordered   10/30/18 1754  DME 3 n 1  Once     10/30/18 1753   10/30/18 1754  DME Walker rolling  Once    Question:  Patient needs a walker to treat with the following condition  Answer:  Status post total replacement of left hip   10/30/18 1753          Diagnostic Studies: Dg Pelvis Portable  Result Date: 10/30/2018 CLINICAL DATA:  Status post left hip surgery EXAM: PORTABLE PELVIS 1-2 VIEWS COMPARISON:  10/01/2018 FINDINGS: Interval left hip replacement with intact hardware and normal alignment. Cutaneous staples and soft tissue gas. No fracture. IMPRESSION: Interval left hip replacement with expected postsurgical changes. Electronically Signed   By: Donavan Foil M.D.   On: 10/30/2018 19:25   Dg C-arm 1-60 Min  Result Date: 10/30/2018 CLINICAL DATA:  Status post left hip arthroplasty. EXAM: DG C-ARM 61-120 MIN; OPERATIVE LEFT HIP WITH PELVIS COMPARISON:  None. FINDINGS: Four AP portable radiographs of the pelvis were obtained portably in the operating room. The show placement of a left total hip arthroplasty device. The hardware components are in anatomic alignment. No periprosthetic fracture or dislocation. IMPRESSION: 1. Status post left total hip arthroplasty. Electronically Signed   By: Kerby Moors M.D.   On: 10/30/2018 16:12   Dg Hip Operative Unilat W Or W/o Pelvis Left  Result Date: 10/30/2018 CLINICAL DATA:  Status post left hip arthroplasty. EXAM: DG C-ARM 61-120 MIN; OPERATIVE LEFT HIP WITH PELVIS COMPARISON:  None. FINDINGS: Four AP portable radiographs of the pelvis were obtained portably in the operating room. The show placement of a left total hip arthroplasty device. The hardware components are in anatomic alignment. No periprosthetic fracture or dislocation. IMPRESSION: 1. Status post left total hip arthroplasty. Electronically Signed   By: Kerby Moors M.D.   On: 10/30/2018 16:12    Disposition: Discharge disposition: 03-Skilled Nursing  Facility     This will be a short-term stay for only short-term skilled nursing for rehabilitation status-post a total hip replacement.     Contact information for follow-up providers    Mcarthur Rossetti, MD Follow up in 2 week(s).   Specialty:  Orthopedic Surgery Contact information: [place] [address] [phone]        [facility] Follow up.   Why:  A representative from [facility] will contact you to arrange start date and time for your therapy. Contact information: [phone]           Contact information for after-discharge care    Destination  [facility] .   Service:  Skilled Nursing Contact information: [place] [phone]                   Signed: Mcarthur Rossetti 11/02/2018, 6:47 AM

## 2018-11-02 NOTE — Progress Notes (Signed)
Patient ID: Kaylee Salas, female   DOB: 12-Mar-1966, 53 y.o.   MRN: [id number] Can go to short-term skilled nursing today.  Left hip stable.

## 2018-11-02 NOTE — Progress Notes (Signed)
Patient will DC IP:[facility] Anticipated DC date:11/02/2018 Family notified:None Transport NL:[id number]  Per MD patient ready for DC to [facility], patient, patient's family, and facility notified of DC. Discharge Summary sent to facility. RN given number for report  [phone]. DC packet on chart. Ambulance transport requested for patient.  CSW signing off.  [place], [place]

## 2018-11-02 NOTE — Plan of Care (Signed)

## 2018-11-02 NOTE — Progress Notes (Signed)
Patient has been ambulating in the room and on the unit with walker.  She ambulated successfully around entire unit.  She stated her hip feels better when moving.  She has been getting her Oxy IR every 3-4 hours and using ice packs.

## 2018-11-02 NOTE — Clinical Social Work Placement (Signed)
   CLINICAL SOCIAL WORK PLACEMENT  NOTE  Date:  11/02/2018  Patient Details  Name: Kaylee Salas MRN: [id number] Date of Birth: 1966/02/13  Clinical Social Work is seeking post-discharge placement for this patient at the [place] level of care (*CSW will initial, date and re-position this form in  chart as items are completed):  Yes   Patient/family provided with [facility] Work Department's list of facilities offering this level of care within the geographic area requested by the patient (or if unable, by the patient's family).  Yes   Patient/family informed of their freedom to choose among providers that offer the needed level of care, that participate in Medicare, Medicaid or managed care program needed by the patient, have an available bed and are willing to accept the patient.      Patient/family informed of [facility]'s ownership interest in [facility] and [facility], as well as of the fact that they are under no obligation to receive care at these facilities.  PASRR submitted to EDS on       PASRR number received on 10/31/18     Existing PASRR number confirmed on       FL2 transmitted to all facilities in geographic area requested by pt/family on 10/31/18     FL2 transmitted to all facilities within larger geographic area on       Patient informed that his/her managed care company has contracts with or will negotiate with certain facilities, including the following:        Yes   Patient/family informed of bed offers received.  Patient chooses bed at [facility] and [facility]     Physician recommends and patient chooses bed at      Patient to be transferred to [facility] on 11/02/18.  Patient to be transferred to facility by [facility]     Patient family notified on 11/02/18 of transfer.  Name of family member notified:  none     PHYSICIAN       Additional Comment:     _______________________________________________ Alberteen Sam, LCSW 11/02/2018, 9:57 AM

## 2018-11-02 NOTE — Progress Notes (Signed)
Called and gave report to facility. All discharge orders and instructions given to pt and discharge paperwork given to [facility]. Pt not in distress and tolerated well.

## 2018-11-06 ENCOUNTER — Other Ambulatory Visit (INDEPENDENT_AMBULATORY_CARE_PROVIDER_SITE_OTHER): Payer: Self-pay | Admitting: Orthopaedic Surgery

## 2018-11-06 ENCOUNTER — Telehealth (INDEPENDENT_AMBULATORY_CARE_PROVIDER_SITE_OTHER): Payer: Self-pay

## 2018-11-06 ENCOUNTER — Telehealth (INDEPENDENT_AMBULATORY_CARE_PROVIDER_SITE_OTHER): Payer: Self-pay | Admitting: Radiology

## 2018-11-06 ENCOUNTER — Other Ambulatory Visit (INDEPENDENT_AMBULATORY_CARE_PROVIDER_SITE_OTHER): Payer: Self-pay

## 2018-11-06 MED ORDER — OXYCODONE HCL 5 MG PO TABS: 5.0000 mg | ORAL_TABLET | ORAL | 0 refills | Status: DC | PRN

## 2018-11-06 MED ORDER — DOXYCYCLINE HYCLATE 100 MG PO TABS: 100.0000 mg | ORAL_TABLET | Freq: Two times a day (BID) | ORAL | 0 refills | Status: DC

## 2018-11-06 NOTE — Telephone Encounter (Signed)
Patient called in states that she called the on-call doctor last night and that due to the symptoms that she was having to go to her local ER and then to call here this morning and get an appt.  She states that she is having swelling in her leg and foot to the point that she her foot is numb but painful when she stands on it, has an area that is red and says it is beginning to streak some this morning, and the incision is draining some and her pain level was an 8.  She states that she was in [place] facility until she went home yesterday, she states that all of this started over the last 12 hours.  She also states that she does not have anything for pain.  I spoke with Dr. Ninfa Linden, he advised that they would send in rx for antibotics, and that we could see her tomorrow for appointment in office.  Patient was scheduled for 11/07/2018 @ 9am

## 2018-11-06 NOTE — Telephone Encounter (Signed)
Requesting pain medication

## 2018-11-06 NOTE — Telephone Encounter (Signed)
I sent some in to her pharmacy.

## 2018-11-07 ENCOUNTER — Encounter (INDEPENDENT_AMBULATORY_CARE_PROVIDER_SITE_OTHER): Payer: Self-pay | Admitting: Orthopaedic Surgery

## 2018-11-07 ENCOUNTER — Ambulatory Visit (INDEPENDENT_AMBULATORY_CARE_PROVIDER_SITE_OTHER): Payer: Medicaid - Out of State | Admitting: Orthopaedic Surgery

## 2018-11-07 ENCOUNTER — Other Ambulatory Visit: Payer: Self-pay

## 2018-11-07 DIAGNOSIS — Z96642 Presence of left artificial hip joint: Secondary | ICD-10-CM

## 2018-11-07 NOTE — Progress Notes (Signed)
Patient is a morbidly obese 53 year old who is 8 days status post a left total hip arthroplasty.  She stayed in her nursing facility for only few days after discharging her now she is back home.  She be concerned about a blood clot so she did go to [facility] rocking him and they obtained a CT angiogram of the lower extremity and was negative for blood clot.  I did put her on antibiotics regardless postoperatively due to her obesity.  On exam she does have a moderate seroma but I was only able to drain about 40 to 50 cc from the thigh itself.  Her staples and proximal sutures are intact.  She does have redness at the groin crease but this is more of just her maceration of her skin.  I placed Bactroban ointment on this and a new Aquacel dressing.  I gave her reassurance that things are going well for her.  She will be seen back in the office next week to assess her incision and to see if it is okay to go ahead and remove the sutures and staples.  She will continue the doxycycline as well.  I gave her next dressing to take home with her to change as well.  All question concerns were answered and addressed.  We will see her back next week.

## 2018-11-12 ENCOUNTER — Other Ambulatory Visit (INDEPENDENT_AMBULATORY_CARE_PROVIDER_SITE_OTHER): Payer: Self-pay | Admitting: Physician Assistant

## 2018-11-12 ENCOUNTER — Telehealth (INDEPENDENT_AMBULATORY_CARE_PROVIDER_SITE_OTHER): Payer: Self-pay | Admitting: Orthopaedic Surgery

## 2018-11-12 MED ORDER — OXYCODONE HCL 5 MG PO TABS: 5.0000 mg | ORAL_TABLET | ORAL | 0 refills | Status: DC | PRN

## 2018-11-12 NOTE — Telephone Encounter (Signed)
New Message   *STAT* If patient is at the pharmacy, call can be transferred to refill team.   1. Which medications need to be refilled? (please list name of each medication and dose if known)  oxycodone 5 mg immediate release tablets every four hours as needed for muscle spasms.  2. Which pharmacy/location (including street and city if local pharmacy) is medication to be sent to? [facility] [address] [place], [place]  3. Do they need a 30 day or 90 day supply?  30 day supply  Duly Noted: Pt verbalized she climbed or walked a hill yesterday and it was one of her goals she achieved but she is in pain.

## 2018-11-12 NOTE — Telephone Encounter (Signed)
Patient wants refill of pain medication

## 2018-11-12 NOTE — Telephone Encounter (Signed)
Rx sent in

## 2018-11-12 NOTE — Telephone Encounter (Signed)
New Message   *STAT* If patient is at the pharmacy, call can be transferred to refill team.   1. Which medications need to be refilled? (please list name of each medication and dose if known)  oxycodone 5 mg immediate release tablets every four hours as needed for muscle spasms  2. Which pharmacy/location (including street and city if local pharmacy) is medication to be sent to? [facility] [address] [place], [place]This is the new pharmacy  3. Do they need a 30 day or 90 day supply?  30 day supply  Duly Noted: Pt went up and down hill yesterday. It made her sore but she was very excited about achieving a goal.

## 2018-11-13 ENCOUNTER — Encounter (INDEPENDENT_AMBULATORY_CARE_PROVIDER_SITE_OTHER): Payer: Self-pay | Admitting: Physician Assistant

## 2018-11-13 ENCOUNTER — Other Ambulatory Visit: Payer: Self-pay

## 2018-11-13 ENCOUNTER — Other Ambulatory Visit (INDEPENDENT_AMBULATORY_CARE_PROVIDER_SITE_OTHER): Payer: Self-pay

## 2018-11-13 ENCOUNTER — Ambulatory Visit (INDEPENDENT_AMBULATORY_CARE_PROVIDER_SITE_OTHER): Payer: Medicaid - Out of State | Admitting: Physician Assistant

## 2018-11-13 ENCOUNTER — Telehealth (INDEPENDENT_AMBULATORY_CARE_PROVIDER_SITE_OTHER): Payer: Self-pay | Admitting: Physician Assistant

## 2018-11-13 DIAGNOSIS — Z96642 Presence of left artificial hip joint: Secondary | ICD-10-CM

## 2018-11-13 MED ORDER — METHOCARBAMOL 500 MG PO TABS: 500.0000 mg | ORAL_TABLET | Freq: Four times a day (QID) | ORAL | 0 refills | Status: DC | PRN

## 2018-11-13 NOTE — Telephone Encounter (Signed)
Called into pharmacy

## 2018-11-13 NOTE — Progress Notes (Signed)
HPI: [name] returns today now 2 weeks status post left total hip arthroplasty.  States she is doing okay.  She has had some bad days from overdoing it walking on the form.  She states having increased pain in the thigh area.  She said no shortness of breath fevers chills.  Overall is happy with the results thus far with the hip.  Physical exam: General well-developed well-nourished female no acute distress.  Left lower extremity: Good range of motion of the left hip without significant pain.  Surgical incision area of abrasion and wound breakdown proximal region with fibrinous tissue.  There is no active purulence or drainage.  Remainder of the incision is well approximated with staples.  The proximal portion is approximated with 4 interrupted nylon sutures.  Left calf supple nontender.  Dorsiflexion plantarflexion ankle intact.  Impression: Status post left total hip arthroplasty 14 days  Plan: Sutures removed today staples were removed from the remainder of the incision and Steri-Strips applied to to the entire excision except for the most proximal region where there is skin breakdown.  Mupirocin and dry gauze is applied over the most proximal portion of the incision.  She will remove this dressing daily wash the area with antibacterial soap daily dry completely and then apply a thin amount of mupirocin and a dry dressing.  She will follow-up with [place] in a week for reevaluation wound.  She will continue her doxycycline.  Questions were encouraged and answered.

## 2018-11-13 NOTE — Telephone Encounter (Signed)
Pt called saying she is having muscle pain.  And if she can get a rx for a [facility] relaxer[facility] [place]

## 2018-11-16 ENCOUNTER — Other Ambulatory Visit (INDEPENDENT_AMBULATORY_CARE_PROVIDER_SITE_OTHER): Payer: Self-pay | Admitting: Orthopedic Surgery

## 2018-11-16 ENCOUNTER — Telehealth (INDEPENDENT_AMBULATORY_CARE_PROVIDER_SITE_OTHER): Payer: Self-pay | Admitting: Orthopaedic Surgery

## 2018-11-16 MED ORDER — OXYCODONE HCL 5 MG PO TABS: 5.0000 mg | ORAL_TABLET | ORAL | 0 refills | Status: DC | PRN

## 2018-11-16 NOTE — Telephone Encounter (Signed)
Can you please advise for Dr. Ninfa Linden?

## 2018-11-16 NOTE — Telephone Encounter (Signed)
Pt is calling asking if she can have a refill on pain medication she will be out Sunday.

## 2018-11-22 ENCOUNTER — Telehealth (INDEPENDENT_AMBULATORY_CARE_PROVIDER_SITE_OTHER): Payer: Self-pay | Admitting: Orthopaedic Surgery

## 2018-11-22 ENCOUNTER — Ambulatory Visit (INDEPENDENT_AMBULATORY_CARE_PROVIDER_SITE_OTHER): Payer: Medicaid - Out of State | Admitting: Physician Assistant

## 2018-11-22 ENCOUNTER — Ambulatory Visit (INDEPENDENT_AMBULATORY_CARE_PROVIDER_SITE_OTHER): Payer: Medicaid - Out of State | Admitting: Orthopaedic Surgery

## 2018-11-22 NOTE — Telephone Encounter (Signed)
Returned call to patient left message on voicemail to return call to reschedule post op visit   Patient advised she is not feeling well

## 2018-11-23 ENCOUNTER — Other Ambulatory Visit (INDEPENDENT_AMBULATORY_CARE_PROVIDER_SITE_OTHER): Payer: Self-pay

## 2018-11-23 ENCOUNTER — Telehealth (INDEPENDENT_AMBULATORY_CARE_PROVIDER_SITE_OTHER): Payer: Self-pay | Admitting: Orthopaedic Surgery

## 2018-11-23 MED ORDER — METHOCARBAMOL 500 MG PO TABS: 500.0000 mg | ORAL_TABLET | Freq: Four times a day (QID) | ORAL | 3 refills | Status: DC | PRN

## 2018-11-23 MED ORDER — OXYCODONE HCL 5 MG PO TABS: 5.0000 mg | ORAL_TABLET | Freq: Four times a day (QID) | ORAL | 0 refills | Status: DC | PRN

## 2018-11-23 NOTE — Telephone Encounter (Signed)
Pt is calling requesting a refill on  methocarbamol and oxycodone. Pt is 3 week post op for hip   [facility] [address] [facility] [place]

## 2018-11-23 NOTE — Telephone Encounter (Signed)
Ok to rf? 

## 2018-11-23 NOTE — Telephone Encounter (Signed)
IC advised done 

## 2018-11-23 NOTE — Telephone Encounter (Signed)
Sent!

## 2018-11-23 NOTE — Telephone Encounter (Signed)
Patient left a message requesting a refill on her Methocarbamol and Oxycodone.  She stated that she will be out of her medication tomorrow.  CB#[phone].  Thank you.

## 2018-11-23 NOTE — Telephone Encounter (Signed)
Can you advise? I had sent to Dr Ninfa Linden but patient called back stating she will be out tomorrow.

## 2018-11-24 MED ORDER — METHOCARBAMOL 500 MG PO TABS: 500.0000 mg | ORAL_TABLET | Freq: Four times a day (QID) | ORAL | 3 refills | Status: DC | PRN

## 2018-11-24 MED ORDER — OXYCODONE HCL 5 MG PO TABS: 5.0000 mg | ORAL_TABLET | Freq: Four times a day (QID) | ORAL | 0 refills | Status: DC | PRN

## 2018-11-27 ENCOUNTER — Telehealth (INDEPENDENT_AMBULATORY_CARE_PROVIDER_SITE_OTHER): Payer: Self-pay | Admitting: Radiology

## 2018-11-27 NOTE — Telephone Encounter (Signed)
Answered no all questions.

## 2018-11-28 ENCOUNTER — Ambulatory Visit (INDEPENDENT_AMBULATORY_CARE_PROVIDER_SITE_OTHER): Payer: Medicaid - Out of State | Admitting: Orthopaedic Surgery

## 2018-11-28 ENCOUNTER — Encounter (INDEPENDENT_AMBULATORY_CARE_PROVIDER_SITE_OTHER): Payer: Self-pay | Admitting: Orthopaedic Surgery

## 2018-11-28 DIAGNOSIS — Z96642 Presence of left artificial hip joint: Secondary | ICD-10-CM

## 2018-11-28 MED ORDER — HYDROCODONE-ACETAMINOPHEN 7.5-325 MG PO TABS: 1.0000 | ORAL_TABLET | Freq: Four times a day (QID) | ORAL | 0 refills | Status: DC | PRN

## 2018-11-28 MED ORDER — METHOCARBAMOL 500 MG PO TABS: 500.0000 mg | ORAL_TABLET | Freq: Four times a day (QID) | ORAL | 3 refills | Status: DC | PRN

## 2018-11-28 NOTE — Progress Notes (Signed)
The patient is now almost a month status post a left direct anterior total hip arthroplasty.  She is someone who is morbidly obese and has had some issues with the superior aspect of her incision healing because that they are the groin crease.  We have had her on antibiotics just to make sure that she does not get infected.  She is here today for wound check.  On examination the top of the wound has some granulation tissue but is still deep.  There is no evidence of infection at all.  I did remove a retained Vicryl suture in this area and remove some of the fibrinous tissue and got good bleeding tissue.  I then placed Xeroform over this and a dry dressing.  When I have her place Xeroform on the wound each day after she showers.  She will still shower with antibacterial soapy water.  I did also aspirate 120 cc of fluid which was seroma around his incision.  The remainder of her hip incision is healed over nicely and there is no evidence of cellulitis or infection.  We will see her back in 2 weeks for wound check.  We will now wean her from oxycodone to hydrocodone and will continue her methocarbamol as well.

## 2018-12-03 ENCOUNTER — Telehealth (INDEPENDENT_AMBULATORY_CARE_PROVIDER_SITE_OTHER): Payer: Self-pay

## 2018-12-03 MED ORDER — OXYCODONE HCL 5 MG PO TABS: 5.0000 mg | ORAL_TABLET | Freq: Four times a day (QID) | ORAL | 0 refills | Status: DC | PRN

## 2018-12-03 MED ORDER — DOXYCYCLINE HYCLATE 100 MG PO TABS: 100.0000 mg | ORAL_TABLET | Freq: Two times a day (BID) | ORAL | 0 refills | Status: DC

## 2018-12-03 MED ORDER — METHOCARBAMOL 500 MG PO TABS: 500.0000 mg | ORAL_TABLET | Freq: Four times a day (QID) | ORAL | 0 refills | Status: DC | PRN

## 2018-12-03 NOTE — Telephone Encounter (Signed)
I will send in some more oxycodone and methocarbamol.  I will send in another antibiotic as well.  She can still hold off being seen until next week.

## 2018-12-03 NOTE — Telephone Encounter (Signed)
Patient aware of the below message  

## 2018-12-03 NOTE — Telephone Encounter (Signed)
Please advise 

## 2018-12-03 NOTE — Telephone Encounter (Signed)
Hip is still very painful.  Needs refill of pain medicine and [facility]omol [facility] [place].  She also states she has some more fluid built up on hip.  Does not think is infected.  Has appointment on 04/15.  Should she come in earlier?  Please call [phone].

## 2018-12-06 ENCOUNTER — Ambulatory Visit (INDEPENDENT_AMBULATORY_CARE_PROVIDER_SITE_OTHER): Payer: Medicaid - Out of State

## 2018-12-06 ENCOUNTER — Other Ambulatory Visit: Payer: Self-pay

## 2018-12-06 ENCOUNTER — Telehealth (INDEPENDENT_AMBULATORY_CARE_PROVIDER_SITE_OTHER): Payer: Self-pay

## 2018-12-06 ENCOUNTER — Ambulatory Visit (INDEPENDENT_AMBULATORY_CARE_PROVIDER_SITE_OTHER): Payer: Medicaid - Out of State | Admitting: Orthopaedic Surgery

## 2018-12-06 DIAGNOSIS — M25552 Pain in left hip: Secondary | ICD-10-CM | POA: Diagnosis not present

## 2018-12-06 DIAGNOSIS — Z96642 Presence of left artificial hip joint: Secondary | ICD-10-CM

## 2018-12-06 MED ORDER — OXYCODONE HCL 5 MG PO TABS: 5.0000 mg | ORAL_TABLET | Freq: Four times a day (QID) | ORAL | 0 refills | Status: DC | PRN

## 2018-12-06 NOTE — Progress Notes (Signed)
The patient is continue to follow-up status post a left total hip arthroplasty.  [place] her against a truck.  She has had debilitating pain in her hip since then.  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  On exam there is no evidence of infection.  Just like she had last week she still has a large seroma so I did remove 80 cc of fluid from the surrounding tissue.  There is no redness.  At the top of her incision there is about a nickel to quarter size dehisced wound with no evidence of infection.  There is good granulation tissue.  I did remove the retained suture from this area.  X-rays of her pelvis and left hip show no acute findings no evidence of fracture.  I will send in some Roxicodone but I have encouraged her to try to start weaning from this.  She will continue keeping this area clean daily with antibacterial soapy water and then a Xeroform dressing.  I would like to send her as a consultation to the outpatient wound center/clinic for evaluation treatment of this proximal hip wound on the left side.  I will see her back myself in a week.  All question concerns were answered and addressed.

## 2018-12-10 ENCOUNTER — Telehealth (INDEPENDENT_AMBULATORY_CARE_PROVIDER_SITE_OTHER): Payer: Self-pay | Admitting: Orthopaedic Surgery

## 2018-12-10 ENCOUNTER — Other Ambulatory Visit (INDEPENDENT_AMBULATORY_CARE_PROVIDER_SITE_OTHER): Payer: Self-pay

## 2018-12-10 DIAGNOSIS — T8149XA Infection following a procedure, other surgical site, initial encounter: Secondary | ICD-10-CM

## 2018-12-10 NOTE — Telephone Encounter (Signed)
Patient also wanted to know if Dr. Ninfa Linden found a wound care center for her/  CB#[phone].  Thank you.

## 2018-12-10 NOTE — Telephone Encounter (Signed)
Can you edit the referral for Kaylee Salas? I cannot. Thanks.

## 2018-12-10 NOTE — Telephone Encounter (Signed)
Can you edit her wound care referral to "[facility]"? For me please

## 2018-12-10 NOTE — Telephone Encounter (Signed)
Patient called to request an RX refill on her Oxycodone.  [facility] [address] in [place], [place].  CB#[phone]

## 2018-12-10 NOTE — Telephone Encounter (Signed)
Please advise 

## 2018-12-10 NOTE — Telephone Encounter (Signed)
Please let her know that I cannot send any in today.  I just refilled her oxycodone on 12/06/2018 and and that was 30 pills.  Also that was only 4 days ago.  She will need to wait 3 more days before we can order this again.  This is per narcotics laws.

## 2018-12-10 NOTE — Telephone Encounter (Signed)
[facility] patient 

## 2018-12-10 NOTE — Telephone Encounter (Signed)
Patient aware of the below message  

## 2018-12-12 ENCOUNTER — Ambulatory Visit (INDEPENDENT_AMBULATORY_CARE_PROVIDER_SITE_OTHER): Payer: Medicaid - Out of State | Admitting: Orthopaedic Surgery

## 2018-12-12 NOTE — Telephone Encounter (Signed)
I call the [place] with [facility]. I had to leave a message requesting fax number to send referral. Phone # [phone]  I did leave message to ask for Fleeta Emmer or Sabrina

## 2018-12-13 ENCOUNTER — Other Ambulatory Visit: Payer: Self-pay

## 2018-12-13 ENCOUNTER — Ambulatory Visit (INDEPENDENT_AMBULATORY_CARE_PROVIDER_SITE_OTHER): Payer: Medicaid - Out of State | Admitting: Orthopaedic Surgery

## 2018-12-13 ENCOUNTER — Telehealth (INDEPENDENT_AMBULATORY_CARE_PROVIDER_SITE_OTHER): Payer: Self-pay

## 2018-12-13 ENCOUNTER — Encounter (INDEPENDENT_AMBULATORY_CARE_PROVIDER_SITE_OTHER): Payer: Self-pay | Admitting: Orthopaedic Surgery

## 2018-12-13 DIAGNOSIS — Z96642 Presence of left artificial hip joint: Secondary | ICD-10-CM

## 2018-12-13 MED ORDER — OXYCODONE HCL 5 MG PO TABS: 5.0000 mg | ORAL_TABLET | Freq: Four times a day (QID) | ORAL | 0 refills | Status: DC | PRN

## 2018-12-13 NOTE — Telephone Encounter (Signed)
Kaylee [facility]'s [facility] for [facility]one.  Stated that she doesn't feel comfortable filling this Rx due to patient taking so many pills.  Stated that she may refuse Rx.  Cb# is [phone].  Please advise.  Thank You.

## 2018-12-13 NOTE — Progress Notes (Signed)
The patient is continuing to follow-up status post a left total hip arthroplasty.  Tomorrow she will be 6 weeks since the surgery.  She is someone who is morbidly obese and we are following her for a dehisced wound at the superior aspect of her incision.  There is been no evidence of infection.  I still counseled her about narcotics use.  The last oxycodone that I did provide for her she went through too quickly.  She says she understands this.  On exam she still has a seroma and I was able to drain about 100 cc of fluid off of the thigh.  There is no evidence of infection at all.  The wound is about the size of a nickel superior aspect of incision is granulating in nicely and she has no evidence of infection at all.  She is treating this with Xeroform and washing it daily.  She will continue the concurrent wound care treatment.  If we can get her into a wound center for outpatient treatment closer her home, that would be great.  From my standpoint I do not need to see her for 2 weeks.  After this dose of oxycodone we need to wean her to hydrocodone.

## 2018-12-14 ENCOUNTER — Other Ambulatory Visit (INDEPENDENT_AMBULATORY_CARE_PROVIDER_SITE_OTHER): Payer: Self-pay | Admitting: Orthopaedic Surgery

## 2018-12-14 NOTE — Telephone Encounter (Signed)
You can let them know that I have spoken to the patient and she understands that this is the last prescription of oxycodone that she can get.  I am fine with the pharmacy holding off on filling it until Monday if they are comfortable with that.

## 2018-12-14 NOTE — Telephone Encounter (Signed)
Have you heard back from the wound center on this pt?

## 2018-12-14 NOTE — Telephone Encounter (Signed)
Its ok was checking on follow ups. I will try to call them today and see what the status is.

## 2018-12-14 NOTE — Telephone Encounter (Signed)
I have not but I also have not been working at [employer], if anyone has called.

## 2018-12-14 NOTE — Telephone Encounter (Signed)
Please advise 

## 2018-12-14 NOTE — Telephone Encounter (Signed)
IC s/w Vaughan Basta and advised per Dr Ninfa Linden.

## 2018-12-14 NOTE — Telephone Encounter (Signed)
Please advise. Thanks.  

## 2018-12-25 ENCOUNTER — Telehealth (INDEPENDENT_AMBULATORY_CARE_PROVIDER_SITE_OTHER): Payer: Self-pay | Admitting: Orthopaedic Surgery

## 2018-12-25 MED ORDER — OXYCODONE HCL 5 MG PO TABS: 5.0000 mg | ORAL_TABLET | Freq: Four times a day (QID) | ORAL | 0 refills | Status: DC | PRN

## 2018-12-25 NOTE — Telephone Encounter (Signed)
Patient called wanting a refill of hydrocodone.  Please call patient to advise.  [phone]

## 2018-12-25 NOTE — Telephone Encounter (Signed)
I sent in one more of these.  She needs to know that this is the last time I can send in this type of med.

## 2018-12-25 NOTE — Telephone Encounter (Signed)
Pt is s/p a total knee replacement 10/30/18 and is requesting refill on oxycodone 5 mg #30 on 11/06/18 please advise.

## 2018-12-26 ENCOUNTER — Other Ambulatory Visit (INDEPENDENT_AMBULATORY_CARE_PROVIDER_SITE_OTHER): Payer: Self-pay | Admitting: Orthopaedic Surgery

## 2018-12-26 NOTE — Telephone Encounter (Signed)
See below

## 2018-12-27 NOTE — Telephone Encounter (Signed)
LMOM for patient of the below message from [facility]  

## 2019-01-01 ENCOUNTER — Ambulatory Visit (INDEPENDENT_AMBULATORY_CARE_PROVIDER_SITE_OTHER): Payer: PRIVATE HEALTH INSURANCE | Admitting: Orthopaedic Surgery

## 2019-01-01 ENCOUNTER — Telehealth: Payer: Self-pay | Admitting: Orthopaedic Surgery

## 2019-01-01 ENCOUNTER — Other Ambulatory Visit: Payer: Self-pay | Admitting: Orthopaedic Surgery

## 2019-01-01 ENCOUNTER — Other Ambulatory Visit: Payer: Self-pay

## 2019-01-01 ENCOUNTER — Encounter: Payer: Self-pay | Admitting: Orthopaedic Surgery

## 2019-01-01 DIAGNOSIS — Z96642 Presence of left artificial hip joint: Secondary | ICD-10-CM

## 2019-01-01 MED ORDER — HYDROCODONE-ACETAMINOPHEN 7.5-325 MG PO TABS: 1.0000 | ORAL_TABLET | Freq: Four times a day (QID) | ORAL | 0 refills | Status: DC | PRN

## 2019-01-01 NOTE — Progress Notes (Signed)
The patient comes in today for continued follow-up at just over a week status post a left total hip arthroplasty.  Her postoperative course has been complicated by wound dehiscence at the superior proximal aspect of her incision.  She is in wound treatment now as an outpatient.  She reports that she is doing better overall.  We been working to try to wean her from narcotics.  She is ambulate with a cane.  She looks much better overall.  There is no evidence of infection with her left hip at all.  There is a small granulating wound about the size of a nickel at her proximal incision and no evidence of infection or purulence.  There is no drainage.  They are treating this with a silver-based dressing.  At this point we will transition her from oxycodone to hydrocodone.  We will see her back in 4 weeks to see how she is doing overall.  We will have an AP pelvis and a lateral of her left hip at that visit.

## 2019-01-01 NOTE — Telephone Encounter (Signed)
[facility] do not have the medication dosage she needed in stock. Patient said the pharmacy asked if the Rx can be sent to the [facility] in [place].  The number to contact patient is [phone]

## 2019-01-01 NOTE — Telephone Encounter (Signed)
Please advise. Thanks.  

## 2019-01-02 MED ORDER — HYDROCODONE-ACETAMINOPHEN 7.5-325 MG PO TABS: 1.0000 | ORAL_TABLET | Freq: Four times a day (QID) | ORAL | 0 refills | Status: DC | PRN

## 2019-01-02 NOTE — Telephone Encounter (Signed)
I sent some in to the [facility] in [place].

## 2019-01-08 ENCOUNTER — Telehealth: Payer: Self-pay | Admitting: Orthopaedic Surgery

## 2019-01-08 ENCOUNTER — Other Ambulatory Visit: Payer: Self-pay | Admitting: Orthopaedic Surgery

## 2019-01-08 MED ORDER — HYDROCODONE-ACETAMINOPHEN 7.5-325 MG PO TABS: 1.0000 | ORAL_TABLET | Freq: Three times a day (TID) | ORAL | 0 refills | Status: DC | PRN

## 2019-01-08 NOTE — Telephone Encounter (Signed)
I sent in some more in to the [facility] in [place] since the [facility] in [place] did not have that particular dosage of the last time.  Do let her know that and also to let her know to try to wean from the medications because we will stop providing them as we get into June because that will then be over 3 months since her surgery and we need her off her narcotics by then.

## 2019-01-08 NOTE — Telephone Encounter (Signed)
Patient called and requested refill of Hydrocodone.  Please call patient [phone] [facility] [place]

## 2019-01-08 NOTE — Telephone Encounter (Signed)
please advise.

## 2019-01-09 ENCOUNTER — Telehealth: Payer: Self-pay | Admitting: Orthopaedic Surgery

## 2019-01-09 NOTE — Telephone Encounter (Signed)
Pt called stating she is having dental work done and needs antibiotics.  Pt dentist is requiring a fax for the antibiotics fax # is [phone]

## 2019-01-09 NOTE — Telephone Encounter (Signed)
She can have her tooth done today and should take the antibiotics.

## 2019-01-09 NOTE — Telephone Encounter (Signed)
Please advise 

## 2019-01-09 NOTE — Telephone Encounter (Signed)
Patient called advised she broke a tooth Friday and have a 2:00pm appointment. Patient asked if it is ok for her to get that done today. Patient said she still have antibiotics to take. Patient had surgery 10/30/2018.  Patient said she is afraid the tooth may get infected. The number to contact patient is [phone]

## 2019-01-09 NOTE — Telephone Encounter (Signed)
I called patient and advised. 

## 2019-01-10 ENCOUNTER — Other Ambulatory Visit (INDEPENDENT_AMBULATORY_CARE_PROVIDER_SITE_OTHER): Payer: Self-pay

## 2019-01-10 MED ORDER — AMOXICILLIN 500 MG PO TABS: ORAL_TABLET | ORAL | 0 refills | Status: DC

## 2019-01-10 NOTE — Telephone Encounter (Signed)
Called into her [facility]

## 2019-01-17 ENCOUNTER — Telehealth: Payer: Self-pay | Admitting: Orthopaedic Surgery

## 2019-01-17 MED ORDER — HYDROCODONE-ACETAMINOPHEN 7.5-325 MG PO TABS: 1.0000 | ORAL_TABLET | Freq: Three times a day (TID) | ORAL | 0 refills | Status: DC | PRN

## 2019-01-17 NOTE — Telephone Encounter (Signed)
Patient called left voicemail message needing Rx refilled (Hydrocodone) for pain. Patient said [facility] uses the [facility] [place]. The number to contact Patient is [phone]

## 2019-01-17 NOTE — Telephone Encounter (Signed)
See message below °

## 2019-01-17 NOTE — Telephone Encounter (Signed)
I sent in this 1 last prescription for hydrocodone for her.  She needs to understand that that will be the last one that I can provide and that we will have to wean her down to tramadol next.

## 2019-01-17 NOTE — Telephone Encounter (Signed)
Called to advise. She is aware

## 2019-01-24 ENCOUNTER — Ambulatory Visit: Payer: PRIVATE HEALTH INSURANCE | Admitting: Orthopaedic Surgery

## 2019-01-29 ENCOUNTER — Telehealth: Payer: Self-pay

## 2019-01-29 ENCOUNTER — Other Ambulatory Visit: Payer: Self-pay | Admitting: Orthopaedic Surgery

## 2019-01-29 MED ORDER — TRAMADOL HCL 50 MG PO TABS: 50.0000 mg | ORAL_TABLET | Freq: Four times a day (QID) | ORAL | 0 refills | Status: DC | PRN

## 2019-01-29 MED ORDER — METHOCARBAMOL 500 MG PO TABS: ORAL_TABLET | ORAL | 0 refills | Status: DC

## 2019-01-29 NOTE — Telephone Encounter (Signed)
Patient called asking for RXRF for ultram sent to [facility]

## 2019-01-29 NOTE — Telephone Encounter (Signed)
Patient would like a Rx for Tramadol and [facility]ocarbamol sent to [facility] [place].  CB# is [phone].  Please advise.  Thank you.

## 2019-01-29 NOTE — Telephone Encounter (Signed)
Please advise 

## 2019-01-31 ENCOUNTER — Ambulatory Visit (INDEPENDENT_AMBULATORY_CARE_PROVIDER_SITE_OTHER): Payer: Self-pay | Admitting: Orthopaedic Surgery

## 2019-01-31 ENCOUNTER — Encounter: Payer: Self-pay | Admitting: Orthopaedic Surgery

## 2019-01-31 ENCOUNTER — Other Ambulatory Visit: Payer: Self-pay

## 2019-01-31 ENCOUNTER — Ambulatory Visit (INDEPENDENT_AMBULATORY_CARE_PROVIDER_SITE_OTHER): Payer: Self-pay

## 2019-01-31 DIAGNOSIS — Z96642 Presence of left artificial hip joint: Secondary | ICD-10-CM

## 2019-01-31 DIAGNOSIS — M7061 Trochanteric bursitis, right hip: Secondary | ICD-10-CM

## 2019-01-31 MED ORDER — METHYLPREDNISOLONE ACETATE 40 MG/ML IJ SUSP
40.0000 mg | INTRAMUSCULAR | Status: AC | PRN
  Administered 2019-01-31: 40 mg via INTRA_ARTICULAR

## 2019-01-31 MED ORDER — LIDOCAINE HCL 1 % IJ SOLN
3.0000 mL | INTRAMUSCULAR | Status: AC | PRN
  Administered 2019-01-31: 3 mL
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
## 2019-02-11 ENCOUNTER — Other Ambulatory Visit: Payer: Self-pay | Admitting: Orthopaedic Surgery

## 2019-05-02 ENCOUNTER — Ambulatory Visit: Payer: PRIVATE HEALTH INSURANCE | Admitting: Orthopaedic Surgery

## 2019-11-18 IMAGING — RF DG HIP (WITH PELVIS) OPERATIVE*L*
1 series · 4 of 4 positions shown · non-contrast
Comparison: None.

CLINICAL DATA: Status post left hip arthroplasty.

EXAM:
DG C-ARM 61-120 MIN; OPERATIVE LEFT HIP WITH PELVIS

[Series 1: unknown protocol · 0.20mm/px · 4 of 4 slices shown]
[im 1/4]
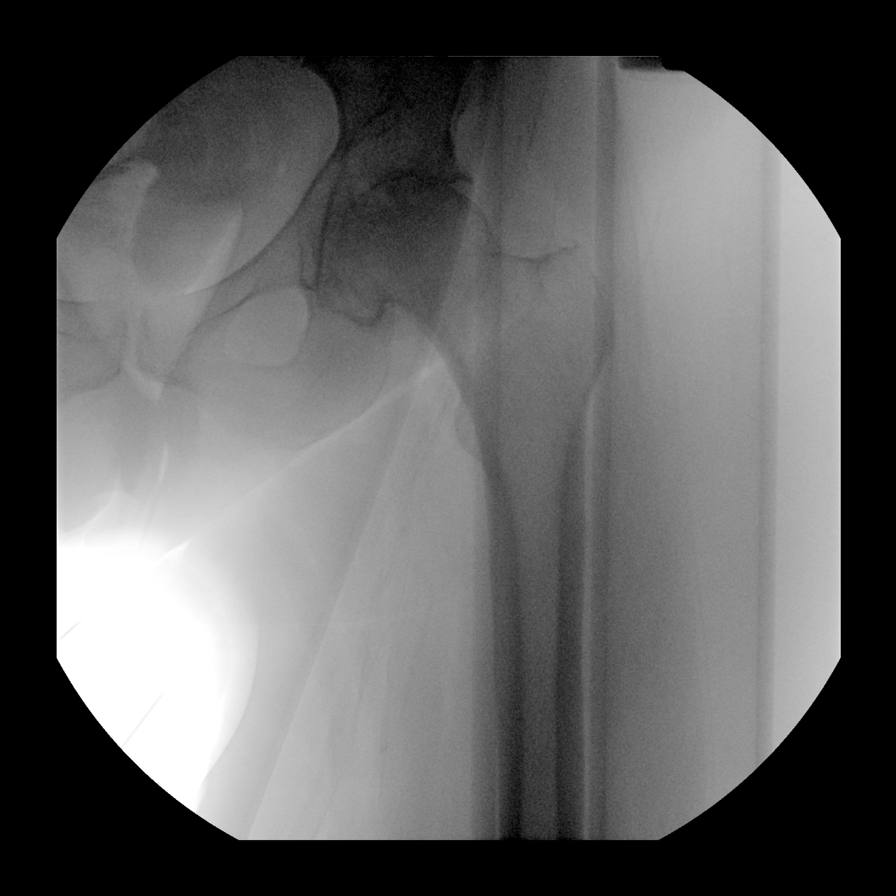
[im 2/4]
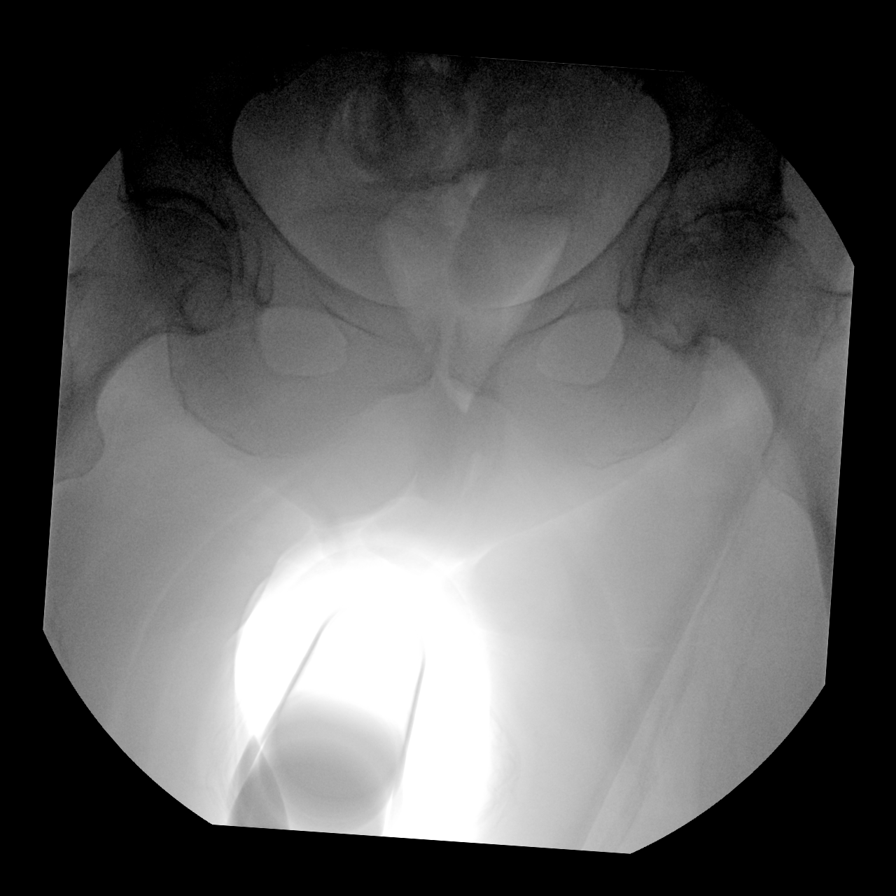
[im 3/4]
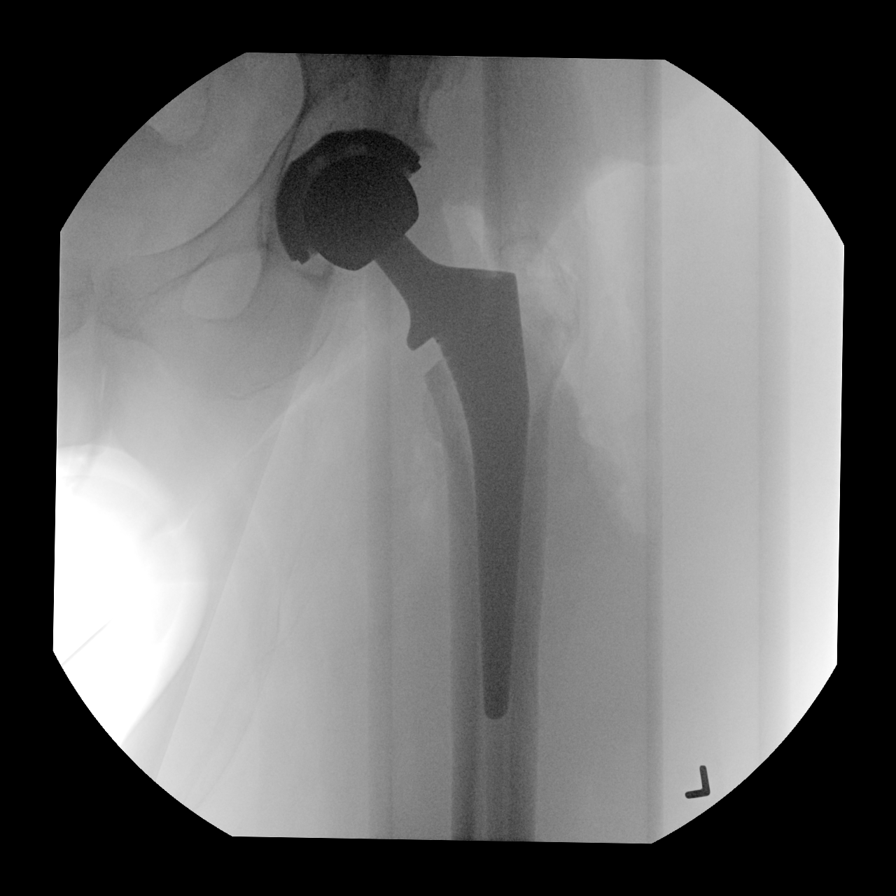
[im 4/4]
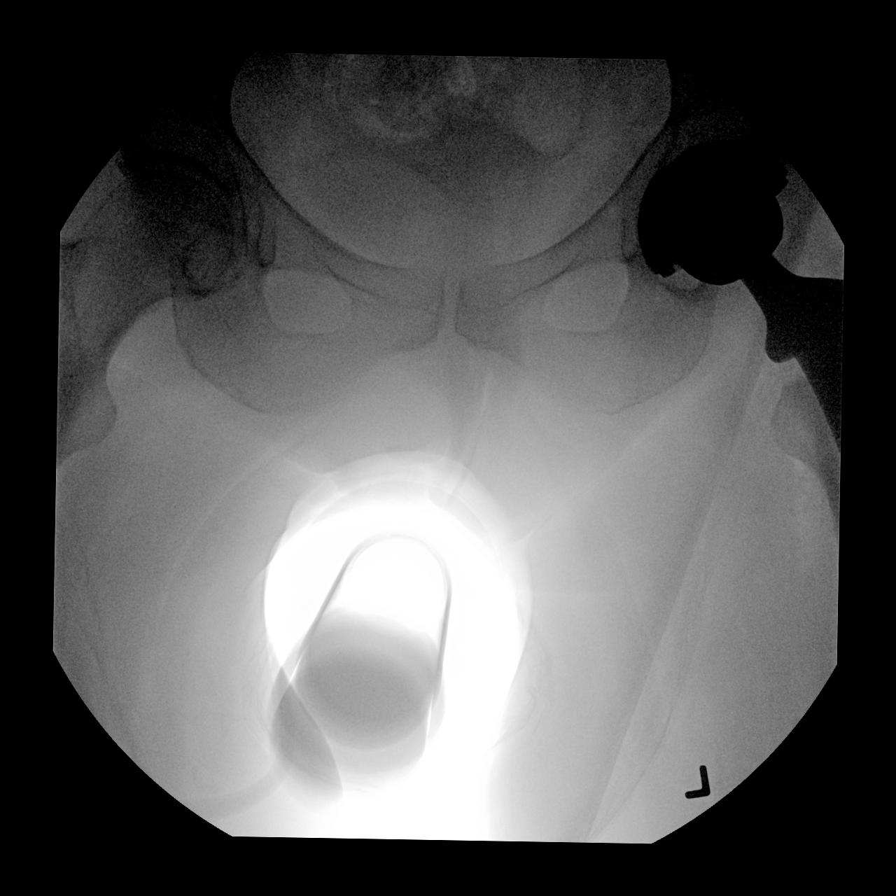

[4 of 4 positions shown; findings below may reference images not displayed]

FINDINGS: Four AP portable radiographs of the pelvis were obtained portably in
the operating room. The show placement of a left total hip
arthroplasty device. The hardware components are in anatomic
alignment. No periprosthetic fracture or dislocation.
IMPRESSION: 1. Status post left total hip arthroplasty.

## 2020-02-17 ENCOUNTER — Ambulatory Visit (INDEPENDENT_AMBULATORY_CARE_PROVIDER_SITE_OTHER): Payer: Self-pay | Admitting: Orthopaedic Surgery

## 2020-02-17 ENCOUNTER — Ambulatory Visit (INDEPENDENT_AMBULATORY_CARE_PROVIDER_SITE_OTHER): Payer: Self-pay

## 2020-02-17 ENCOUNTER — Other Ambulatory Visit: Payer: Self-pay

## 2020-02-17 DIAGNOSIS — M25552 Pain in left hip: Secondary | ICD-10-CM

## 2020-02-17 DIAGNOSIS — M25551 Pain in right hip: Secondary | ICD-10-CM

## 2020-02-17 DIAGNOSIS — M25561 Pain in right knee: Secondary | ICD-10-CM

## 2020-02-17 MED ORDER — HYDROCODONE-ACETAMINOPHEN 5-325 MG PO TABS: 1.0000 | ORAL_TABLET | Freq: Four times a day (QID) | ORAL | 0 refills | Status: DC | PRN
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
## 2020-02-20 ENCOUNTER — Ambulatory Visit: Payer: Self-pay

## 2020-02-20 ENCOUNTER — Encounter: Payer: Self-pay | Admitting: Family Medicine

## 2020-02-20 ENCOUNTER — Ambulatory Visit (INDEPENDENT_AMBULATORY_CARE_PROVIDER_SITE_OTHER): Payer: Medicaid - Out of State | Admitting: Family Medicine

## 2020-02-20 ENCOUNTER — Other Ambulatory Visit: Payer: Self-pay

## 2020-02-20 DIAGNOSIS — M25551 Pain in right hip: Secondary | ICD-10-CM
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
## 2020-03-03 ENCOUNTER — Telehealth: Payer: Self-pay | Admitting: Orthopaedic Surgery

## 2020-03-03 MED ORDER — HYDROCODONE-ACETAMINOPHEN 5-325 MG PO TABS: 1.0000 | ORAL_TABLET | Freq: Four times a day (QID) | ORAL | 0 refills | Status: DC | PRN

## 2020-03-03 NOTE — Telephone Encounter (Signed)
Please advise 

## 2020-03-03 NOTE — Telephone Encounter (Signed)
Patient called. She would like a [facility] [address] [facility] [place], [place]. Her call back number is [phone]

## 2020-03-17 ENCOUNTER — Telehealth: Payer: Self-pay | Admitting: Orthopaedic Surgery

## 2020-03-17 NOTE — Telephone Encounter (Signed)
Please advise 

## 2020-03-17 NOTE — Telephone Encounter (Signed)
Patient called. She would like Tramadol called in for her. Her call back number is [phone]

## 2020-03-18 MED ORDER — TRAMADOL HCL 50 MG PO TABS: 50.0000 mg | ORAL_TABLET | Freq: Four times a day (QID) | ORAL | 0 refills | Status: DC | PRN

## 2020-03-30 ENCOUNTER — Ambulatory Visit (INDEPENDENT_AMBULATORY_CARE_PROVIDER_SITE_OTHER): Payer: Self-pay | Admitting: Orthopaedic Surgery

## 2020-03-30 ENCOUNTER — Encounter: Payer: Self-pay | Admitting: Orthopaedic Surgery

## 2020-03-30 ENCOUNTER — Other Ambulatory Visit: Payer: Self-pay

## 2020-03-30 VITALS — Ht 61.0 in | Wt 170.0 lb

## 2020-03-30 DIAGNOSIS — G8929 Other chronic pain: Secondary | ICD-10-CM

## 2020-03-30 DIAGNOSIS — M25551 Pain in right hip: Secondary | ICD-10-CM

## 2020-03-30 DIAGNOSIS — M25561 Pain in right knee: Secondary | ICD-10-CM

## 2020-03-30 MED ORDER — HYDROCODONE-ACETAMINOPHEN 5-325 MG PO TABS: 1.0000 | ORAL_TABLET | Freq: Four times a day (QID) | ORAL | 0 refills | Status: DC | PRN

## 2020-03-30 MED ORDER — FLUCONAZOLE 150 MG PO TABS: 150.0000 mg | ORAL_TABLET | Freq: Every day | ORAL | 0 refills | Status: DC
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
## 2020-05-11 ENCOUNTER — Ambulatory Visit: Payer: Medicaid - Out of State | Admitting: Orthopaedic Surgery

## 2020-05-26 ENCOUNTER — Telehealth: Payer: Self-pay

## 2020-05-26 NOTE — Telephone Encounter (Signed)
Called and left a VM asking patient which knee injection would she like to have.  Advised patient to call back to clarify.

## 2020-05-27 ENCOUNTER — Encounter: Payer: Self-pay | Admitting: Family Medicine

## 2020-05-27 ENCOUNTER — Encounter: Payer: Self-pay | Admitting: Orthopaedic Surgery

## 2020-05-27 ENCOUNTER — Ambulatory Visit (INDEPENDENT_AMBULATORY_CARE_PROVIDER_SITE_OTHER): Payer: PRIVATE HEALTH INSURANCE | Admitting: Family Medicine

## 2020-05-27 ENCOUNTER — Ambulatory Visit: Payer: Self-pay

## 2020-05-27 ENCOUNTER — Ambulatory Visit: Payer: PRIVATE HEALTH INSURANCE | Admitting: Orthopaedic Surgery

## 2020-05-27 ENCOUNTER — Other Ambulatory Visit: Payer: Self-pay

## 2020-05-27 DIAGNOSIS — M1711 Unilateral primary osteoarthritis, right knee: Secondary | ICD-10-CM

## 2020-05-27 DIAGNOSIS — M25561 Pain in right knee: Secondary | ICD-10-CM

## 2020-05-27 DIAGNOSIS — M25551 Pain in right hip: Secondary | ICD-10-CM

## 2020-05-27 MED ORDER — HYDROCODONE-ACETAMINOPHEN 5-325 MG PO TABS: 1.0000 | ORAL_TABLET | Freq: Four times a day (QID) | ORAL | 0 refills | Status: DC | PRN

## 2020-05-27 NOTE — Progress Notes (Signed)
Subjective: Patient is here for ultrasound-guided intra-articular right hip injection.   Prior injection helped a lot for a few months.  Objective:  Pain with passive IR.  Procedure: Ultrasound-guided right hip injection: After sterile prep with Betadine, injected 8 cc 1% lidocaine without epinephrine and 40 mg methylprednisolone using a 22-gauge spinal needle, passing the needle through the iliofemoral ligament into the femoral head/neck junction.  Injectate seen filling joint capsule.  Good immediate relief.
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
## 2020-06-01 ENCOUNTER — Telehealth: Payer: Self-pay

## 2020-06-01 NOTE — Telephone Encounter (Signed)
Called and left a VM concerning gel injection.

## 2020-06-08 ENCOUNTER — Telehealth: Payer: Self-pay

## 2020-06-08 NOTE — Telephone Encounter (Signed)
Talked with patient concerning gel injection.  

## 2020-06-10 ENCOUNTER — Telehealth: Payer: Self-pay

## 2020-06-10 NOTE — Telephone Encounter (Signed)
Mailed [employer] application to patient's address listed for Monovisc, right knee. 

## 2021-01-18 ENCOUNTER — Encounter: Payer: Self-pay | Admitting: Orthopaedic Surgery

## 2021-01-18 ENCOUNTER — Telehealth: Payer: Self-pay

## 2021-01-18 ENCOUNTER — Ambulatory Visit (INDEPENDENT_AMBULATORY_CARE_PROVIDER_SITE_OTHER): Payer: 59 | Admitting: Orthopaedic Surgery

## 2021-01-18 DIAGNOSIS — M1711 Unilateral primary osteoarthritis, right knee: Secondary | ICD-10-CM | POA: Diagnosis not present

## 2021-01-18 DIAGNOSIS — G8929 Other chronic pain: Secondary | ICD-10-CM

## 2021-01-18 DIAGNOSIS — M25561 Pain in right knee: Secondary | ICD-10-CM | POA: Diagnosis not present

## 2021-01-18 MED ORDER — METHYLPREDNISOLONE ACETATE 40 MG/ML IJ SUSP
40.0000 mg | INTRAMUSCULAR | Status: AC | PRN
  Administered 2021-01-18: 40 mg via INTRA_ARTICULAR

## 2021-01-18 MED ORDER — LIDOCAINE HCL 1 % IJ SOLN
3.0000 mL | INTRAMUSCULAR | Status: AC | PRN
  Administered 2021-01-18: 3 mL

## 2021-01-18 MED ORDER — MELOXICAM 15 MG PO TABS: 15.0000 mg | ORAL_TABLET | Freq: Every day | ORAL | 3 refills | Status: DC | PRN

## 2021-01-18 NOTE — Progress Notes (Signed)
Office Visit Note   Patient: Kaylee Salas           Date of Birth: 02/17/1966           MRN: [id number] Visit Date: 01/18/2021              Requested by: Emelda Fear, [place] [address] PCP: Emelda Fear, DO   Assessment & Plan: Visit Diagnoses:  1. Chronic pain of right knee   2. Unilateral primary osteoarthritis, right knee     Plan: I will send in some meloxicam for inflammation and pain.  I did place a steroid injection per her request in her right knee today to calm down the acute pain but she is definitely a candidate for hyaluronic acid for that right knee given the arthritis she has in it and her young age of 55.  Since she has tried and failed other forms of conservative treatment that would be the next step.  She continues to work on weight loss and quad strengthening.  We will see her back in follow-up once we have hyaluronic acid approved for the right knee to treat the pain from osteoarthritis.  Follow-Up Instructions: No follow-ups on file.   Orders:  No orders of the defined types were placed in this encounter.  Meds ordered this encounter  Medications  . meloxicam (MOBIC) 15 MG tablet    Sig: Take 1 tablet (15 mg total) by mouth daily as needed for pain.    Dispense:  30 tablet    Refill:  3      Procedures: Large Joint Inj: R knee on 01/18/2021 3:37 PM Indications: diagnostic evaluation and pain Details: 22 G 1.5 in needle, superolateral approach  Arthrogram: No  Medications: 3 mL lidocaine 1 %; 40 mg methylPREDNISolone acetate 40 MG/ML Outcome: tolerated well, no immediate complications Procedure, treatment alternatives, risks and benefits explained, specific risks discussed. Consent was given by the patient. Immediately prior to procedure a time out was called to verify the correct patient, procedure, equipment, support staff and site/side marked as required. Patient was prepped and draped in the usual sterile fashion.        Clinical Data: No additional findings.   Subjective: Chief Complaint  Patient presents with  . Right Knee - Follow-up  The patient is a 55 year old female well-known to me.  We have replaced her left hip before.  She comes in today with known osteoarthritis of her right knee.  She has had a previous ACL reconstruction of the right knee remotely.  She has well-documented osteoarthritis of the right knee.  She is working at a job now where she is on concrete on her feet all day long.  This is caused an acute flareup of the arthritis in her right knee and her right hip.  She is requesting a steroid injection in her right knee today.  She has had better insurance now so she was requesting also considering hyaluronic acid for that knee which I think is absolutely reasonable considering her arthritis is well-documented in the right knee.  She is seeing Dr. Junius Roads tomorrow for an intra-articular injection under ultrasound in the right hip.  It has been 8 to 9 months since injections in either area.  She has had no other acute change in her medical status.  HPI  Review of Systems There is currently listed no headache, chest pain, shortness of breath, fever, chills, nausea, vomiting  Objective: Vital Signs: There were  no vitals taken for this visit.  Physical Exam She is alert and orient x3 and in no acute distress Ortho Exam Examination of her right knee shows no effusion but there is varus malalignment and global tenderness throughout the arc of motion of her knee and her previous x-rays also confirm severe arthritis in that right knee. Specialty Comments:  No specialty comments available.  Imaging: No results found.   PMFS History: Patient Active Problem List   Diagnosis Date Noted  . Unilateral primary osteoarthritis, right knee 05/27/2020  . Unilateral primary osteoarthritis, left hip 10/30/2018  . Status post total replacement of left hip 10/30/2018   Past Medical History:   Diagnosis Date  . Anxiety   . Arthritis   . Complication of anesthesia    "woke up panicking/fighting"  . Depression   . Unilateral primary osteoarthritis, left hip 10/30/2018    History reviewed. No pertinent family history.  Past Surgical History:  Procedure Laterality Date  . CHOLECYSTECTOMY  2004  . GASTRIC BYPASS    . HAND SURGERY Right    ring finger reconstruction  . HERNIA REPAIR    . KNEE ARTHROSCOPY W/ ACL RECONSTRUCTION Right 1997  . TOTAL HIP ARTHROPLASTY Left 10/30/2018   Procedure: LEFT TOTAL HIP ARTHROPLASTY ANTERIOR APPROACH;  Surgeon: Mcarthur Rossetti, MD;  Location: [facility];  Service: Orthopedics;  Laterality: Left;  . UMBILICAL HERNIA REPAIR  2004   Social History   Occupational History  . Not on file  Tobacco Use  . Smoking status: Never Smoker  . Smokeless tobacco: Never Used  Vaping Use  . Vaping Use: Never used  Substance and Sexual Activity  . Alcohol use: Not Currently  . Drug use: Never  . Sexual activity: Not on file

## 2021-01-18 NOTE — Telephone Encounter (Signed)
Right knee gel injection Or did anything ever come of the [employer]?

## 2021-01-20 ENCOUNTER — Ambulatory Visit: Payer: Medicaid - Out of State | Admitting: Orthopaedic Surgery

## 2021-01-21 ENCOUNTER — Other Ambulatory Visit: Payer: Self-pay | Admitting: Physician Assistant

## 2021-01-21 ENCOUNTER — Ambulatory Visit: Payer: Medicaid - Out of State | Admitting: Family Medicine

## 2021-01-21 ENCOUNTER — Telehealth: Payer: Self-pay

## 2021-01-21 MED ORDER — CELECOXIB 200 MG PO CAPS
200.0000 mg | ORAL_CAPSULE | Freq: Every day | ORAL | 0 refills | Status: DC
Start: 2021-01-21 — End: 2021-07-06

## 2021-01-21 NOTE — Progress Notes (Unsigned)
Celebrex

## 2021-01-21 NOTE — Telephone Encounter (Signed)
Patient aware this was called in for her  

## 2021-01-21 NOTE — Telephone Encounter (Signed)
Pt called stating that the Meloxicam  that she was given makes her stomach very upset she would like to know if she can have something different sent in for pain

## 2021-02-05 ENCOUNTER — Telehealth: Payer: Self-pay | Admitting: Orthopaedic Surgery

## 2021-02-05 NOTE — Telephone Encounter (Signed)
Patient called. Says the medication is helping but still having pain. Would like to be referred to pain management. Her call back number is [phone]

## 2021-02-08 ENCOUNTER — Other Ambulatory Visit: Payer: Self-pay

## 2021-02-08 DIAGNOSIS — M1711 Unilateral primary osteoarthritis, right knee: Secondary | ICD-10-CM

## 2021-02-08 DIAGNOSIS — M25561 Pain in right knee: Secondary | ICD-10-CM

## 2021-02-08 NOTE — Telephone Encounter (Signed)
Order in chart and I called and lm on vm to advise pt that this has been done. That it can take several weeks to hear back on an appt but that she can call to check the status at any time.

## 2021-02-11 ENCOUNTER — Encounter: Payer: Self-pay | Admitting: Family Medicine

## 2021-02-11 ENCOUNTER — Ambulatory Visit: Payer: Self-pay

## 2021-02-11 ENCOUNTER — Ambulatory Visit: Payer: 59 | Admitting: Family Medicine

## 2021-02-11 ENCOUNTER — Other Ambulatory Visit: Payer: Self-pay

## 2021-02-11 ENCOUNTER — Telehealth: Payer: Self-pay | Admitting: Orthopaedic Surgery

## 2021-02-11 DIAGNOSIS — M25551 Pain in right hip: Secondary | ICD-10-CM

## 2021-02-11 NOTE — Telephone Encounter (Signed)
Pt wants to know when she can get scheduled again for her knee inj. Pt was here today 02/11/21 for a hip inj with [facility] and wanted to check on the status while she was here, I did not see any notes stating we had called her and advised her that it was here and ready to be scheduled for the follow up knee inj. Please advise if it is here and she is eligible to receive it and I can get her scheduled or if we need to wait between her hip and knee injs.

## 2021-02-11 NOTE — Progress Notes (Signed)
Subjective: Patient is here for ultrasound-guided intra-articular right hip injection.   Prior injection helped for a while, but she has been hurting quite a bit for the past several months.  Her left lateral hip is hurting as well, and also in her right knee.  Objective: Pain with internal rotation.  Procedure: Ultrasound guided injection is preferred based studies that show increased duration, increased effect, greater accuracy, decreased procedural pain, increased response rate, and decreased cost with ultrasound guided versus blind injection.   Verbal informed consent obtained.  Time-out conducted.  Noted no overlying erythema, induration, or other signs of local infection. Ultrasound-guided right hip injection: After sterile prep with Betadine, injected 4 cc 0.25% bupivacaine without epinephrine and 6 mg betamethasone using a 22-gauge spinal needle, passing the needle through the iliofemoral ligament into the femoral head/neck junction.  Good relief during the anesthetic phase.  Lateral leg raises were recommended.

## 2021-02-11 NOTE — Telephone Encounter (Signed)
Called and left a VM for patient to return my call concerning gel injection.

## 2021-02-11 NOTE — Telephone Encounter (Signed)
error 

## 2021-03-05 ENCOUNTER — Encounter: Payer: Self-pay | Admitting: Physical Medicine & Rehabilitation

## 2021-03-15 ENCOUNTER — Telehealth: Payer: Self-pay

## 2021-03-15 NOTE — Telephone Encounter (Signed)
Patient called she is requesting j&j gel injection form to be emailed to her: [email or web address] patient call [phone]

## 2021-03-15 NOTE — Telephone Encounter (Signed)
Patient is requesting a call back when you are back in the office.

## 2021-03-16 NOTE — Telephone Encounter (Signed)
Talked with patient concerning [employer] application.   Emailed [employer] application to patients e-mail at [email or web address]

## 2021-03-23 ENCOUNTER — Telehealth: Payer: Self-pay

## 2021-03-23 NOTE — Telephone Encounter (Signed)
FYI-  Patient had emailed me the [employer] application back and she also stated that she would like to discuss these things during her visit on Wednesday, 03/24/2021.  1)  Both hips and the constant pain - Right hip replacement ?? 2) Both ankles and feet injections - hopefully ankle injections during visit ??

## 2021-03-23 NOTE — Telephone Encounter (Signed)
Received [employer] application from patient for Monovisc, right knee. Faxed completed [employer] application to [phone].

## 2021-03-24 ENCOUNTER — Ambulatory Visit: Payer: Self-pay

## 2021-03-24 ENCOUNTER — Ambulatory Visit (INDEPENDENT_AMBULATORY_CARE_PROVIDER_SITE_OTHER): Payer: 59

## 2021-03-24 ENCOUNTER — Telehealth: Payer: Self-pay

## 2021-03-24 ENCOUNTER — Ambulatory Visit (INDEPENDENT_AMBULATORY_CARE_PROVIDER_SITE_OTHER): Payer: 59 | Admitting: Orthopaedic Surgery

## 2021-03-24 DIAGNOSIS — M25552 Pain in left hip: Secondary | ICD-10-CM

## 2021-03-24 DIAGNOSIS — M25572 Pain in left ankle and joints of left foot: Secondary | ICD-10-CM | POA: Diagnosis not present

## 2021-03-24 DIAGNOSIS — M25571 Pain in right ankle and joints of right foot: Secondary | ICD-10-CM | POA: Diagnosis not present

## 2021-03-24 DIAGNOSIS — M1611 Unilateral primary osteoarthritis, right hip: Secondary | ICD-10-CM | POA: Insufficient documentation

## 2021-03-24 DIAGNOSIS — M25551 Pain in right hip: Secondary | ICD-10-CM

## 2021-03-24 DIAGNOSIS — Z96642 Presence of left artificial hip joint: Secondary | ICD-10-CM

## 2021-03-24 MED ORDER — TRAMADOL HCL 50 MG PO TABS: 50.0000 mg | ORAL_TABLET | Freq: Four times a day (QID) | ORAL | 0 refills | Status: DC | PRN

## 2021-03-24 NOTE — Progress Notes (Signed)
Office Visit Note   Patient: Kaylee Salas           Date of Birth: 04/05/1966           MRN: [id number] Visit Date: 03/24/2021              Requested by: Emelda Fear, [place] [address] PCP: Emelda Fear, DO   Assessment & Plan: Visit Diagnoses:  1. Acute bilateral ankle pain   2. Bilateral hip pain   3. History of total left hip replacement   4. Unilateral primary osteoarthritis, right hip     Plan: At this point I also agree with her need for a right total hip arthroplasty given the severity of her arthritis on clinical exam and x-ray findings combined with her pain.  She is fully aware of what the surgery involves having had it before and left side.  She is aware of the risk and benefits of surgery.  I do feel that this would help offload her knee and her foot and ankle on that side.  I still think that she would benefit from a hyaluronic acid injection for the right knee but may eventually need a right knee replacement.  I will send in some tramadol for pain and we will work on getting her surgery scheduled for a right total hip arthroplasty.  All questions and concerns were answered and addressed.  Follow-Up Instructions: Return for 2 weeks post-op.   Orders:  Orders Placed This Encounter  Procedures   XR HIPS BILAT W OR W/O PELVIS 3-4 VIEWS   XR Ankle Complete Right   XR Ankle Complete Left   No orders of the defined types were placed in this encounter.     Procedures: No procedures performed   Clinical Data: No additional findings.   Subjective: Chief Complaint  Patient presents with   Right Ankle - Pain   Left Ankle - Pain   Right Hip - Pain   Left Hip - Pain  The patient comes in today with multiple issues.  We have replaced her left hip before.  She had a recent fall off a ladder landing on that left hip and has had some left hip pain.  She also has a history of chronic bilateral ankle pain and foot issues.  She would like  x-rays of both her ankles today due to this fall.  She also has known well-documented severe arthritis in her right hip.  She has tried and failed all forms conservative treatment for the right hip for over 12 months.  She has even had intra-articular steroid injection in the right hip.  She is lost weight as well.  Her pain is daily and is detrimentally affecting her mobility, quality of life and actives daily living.  She has known osteoarthritis of her right knee with the previous ACL reconstruction.  She is awaiting hopefully a donation for a gel/hyaluronic acid injection into her right knee.  She is interested in having her right hip replaced at this standpoint given the severity of her right hip pain  HPI  Review of Systems There is currently listed no headache, chest pain, shortness of breath, fever, chills, nausea, vomiting  Objective: Vital Signs: There were no vitals taken for this visit.  Physical Exam She is alert and orient x3 and in no acute distress Ortho Exam Examination of her left hip shows minimal pain with rotation of the left hip.  The right hip shows  significant pain with internal and external rotation as well as stiffness with rotation.  The right knee has obvious medial lateral joint line tenderness with patellofemoral crepitation.  There is well-healed surgical incisions from her ACL reconstruction.  Both ankles are stable ligamentously with no swelling and full range of motion.  Both feet are wide and have bunionette's. Specialty Comments:  No specialty comments available.  Imaging: XR Ankle Complete Left  Result Date: 03/24/2021 3 views left ankle show no acute findings.  XR HIPS BILAT W OR W/O PELVIS 3-4 VIEWS  Result Date: 03/24/2021 An AP pelvis and lateral of the right and left hip shows a well-seated total hip arthroplasty on the left side with no complicating features or acute findings.  There is no evidence of fracture in light of the patient's recent fall.   There is end-stage arthritis of the right hip with superior lateral joint space narrowing, sclerotic changes and para-articular osteophytes.  This is worsened when compared to films from several years ago.  XR Ankle Complete Right  Result Date: 03/24/2021 3 views of the right ankle show no acute findings.    PMFS History: Patient Active Problem List   Diagnosis Date Noted   Unilateral primary osteoarthritis, right hip 03/24/2021   Unilateral primary osteoarthritis, right knee 05/27/2020   Unilateral primary osteoarthritis, left hip 10/30/2018   Status post total replacement of left hip 10/30/2018   Past Medical History:  Diagnosis Date   Anxiety    Arthritis    Complication of anesthesia    "woke up panicking/fighting"   Depression    Unilateral primary osteoarthritis, left hip 10/30/2018    No family history on file.  Past Surgical History:  Procedure Laterality Date   CHOLECYSTECTOMY  2004   GASTRIC BYPASS     HAND SURGERY Right    ring finger reconstruction   HERNIA REPAIR     KNEE ARTHROSCOPY W/ ACL RECONSTRUCTION Right 1997   TOTAL HIP ARTHROPLASTY Left 10/30/2018   Procedure: LEFT TOTAL HIP ARTHROPLASTY ANTERIOR APPROACH;  Surgeon: Mcarthur Rossetti, MD;  Location: [facility];  Service: Orthopedics;  Laterality: Left;   UMBILICAL HERNIA REPAIR  2004   Social History   Occupational History   Not on file  Tobacco Use   Smoking status: Never   Smokeless tobacco: Never  Vaping Use   Vaping Use: Never used  Substance and Sexual Activity   Alcohol use: Not Currently   Drug use: Never   Sexual activity: Not on file

## 2021-03-24 NOTE — Telephone Encounter (Signed)
Received PRF from [employer] for Monovisc, right knee. Faxed completed PRF to [employer] at [phone].

## 2021-03-31 ENCOUNTER — Telehealth: Payer: Self-pay

## 2021-03-31 ENCOUNTER — Telehealth: Payer: Self-pay | Admitting: Orthopaedic Surgery

## 2021-03-31 NOTE — Telephone Encounter (Signed)
Talked to patient concerning scheduling appointment for gel injection.

## 2021-03-31 NOTE — Telephone Encounter (Signed)
Patient would like a Rx refill on methocarbamol?  Cb# [phone].  Please advise.  Thank you

## 2021-03-31 NOTE — Telephone Encounter (Signed)
Pt called to set appt for MonosviscOne gel injection. Please call pt at [phone].

## 2021-04-01 ENCOUNTER — Telehealth: Payer: Self-pay | Admitting: Physician Assistant

## 2021-04-01 ENCOUNTER — Other Ambulatory Visit: Payer: Self-pay | Admitting: Physician Assistant

## 2021-04-01 MED ORDER — METHOCARBAMOL 500 MG PO TABS: 500.0000 mg | ORAL_TABLET | Freq: Three times a day (TID) | ORAL | 0 refills | Status: DC

## 2021-04-01 NOTE — Telephone Encounter (Signed)
Pt called asking for a phone call when medication is called into pharmacy. Please call pt at [phone].

## 2021-04-02 NOTE — Telephone Encounter (Signed)
Called and advised pt.

## 2021-04-07 ENCOUNTER — Telehealth: Payer: Self-pay

## 2021-04-07 NOTE — Telephone Encounter (Signed)
Called and left a VM for patient to CB to schedule an appointment for gel injection with Dr. Ninfa Linden or Artis Delay.  Monovisc, right knee. Purchased through [employer] patient assistance. No Co-pay

## 2021-04-12 ENCOUNTER — Ambulatory Visit (INDEPENDENT_AMBULATORY_CARE_PROVIDER_SITE_OTHER): Payer: 59 | Admitting: Physician Assistant

## 2021-04-12 ENCOUNTER — Other Ambulatory Visit: Payer: Self-pay

## 2021-04-12 ENCOUNTER — Encounter: Payer: Self-pay | Admitting: Physician Assistant

## 2021-04-12 VITALS — BP 137/83 | HR 84

## 2021-04-12 DIAGNOSIS — M1711 Unilateral primary osteoarthritis, right knee: Secondary | ICD-10-CM | POA: Diagnosis not present

## 2021-04-12 MED ORDER — HYALURONAN 88 MG/4ML IX SOSY
88.0000 mg | PREFILLED_SYRINGE | INTRA_ARTICULAR | Status: AC | PRN
  Administered 2021-04-12: 88 mg via INTRA_ARTICULAR

## 2021-04-12 NOTE — Progress Notes (Signed)
   Procedure Note  Patient: Kaylee Salas             Date of Birth: December 12, 1965           MRN: [id number]             Visit Date: 04/12/2021   HPI: [name] comes in today for scheduled right knee Monovisc injection.  She has known right knee tricompartmental arthritis.  She has failed conservative treatment.  She has had no new injury to the knee.  She has no scheduled knee arthroplasty within the next 6 months.  Procedures: Visit Diagnoses:  1. Unilateral primary osteoarthritis, right knee     Large Joint Inj: R knee on 04/12/2021 3:43 PM Indications: pain Details: 22 G 1.5 in needle, superolateral approach  Arthrogram: No  Medications: 88 mg Hyaluronan 88 MG/4ML Outcome: tolerated well, no immediate complications Procedure, treatment alternatives, risks and benefits explained, specific risks discussed. Consent was given by the patient. Immediately prior to procedure a time out was called to verify the correct patient, procedure, equipment, support staff and site/side marked as required. Patient was prepped and draped in the usual sterile fashion.   Plan: She understands to wait at least 6 months between supplemental injections right knee.  She will follow-up with [place] on an as-needed basis in regards to the knee.  She did ask about seeing someone in our group for about foot pain recommend that she make an appointment at checkout today with Dr. Sharol Given.  Questions encouraged and answered

## 2021-04-15 ENCOUNTER — Telehealth: Payer: Self-pay | Admitting: Orthopaedic Surgery

## 2021-04-15 NOTE — Telephone Encounter (Signed)
Pt called and is wanting to see DR.Sharol Given?   CB [phone]

## 2021-04-15 NOTE — Telephone Encounter (Signed)
scheduled

## 2021-04-19 ENCOUNTER — Ambulatory Visit: Payer: Self-pay

## 2021-04-19 ENCOUNTER — Ambulatory Visit (INDEPENDENT_AMBULATORY_CARE_PROVIDER_SITE_OTHER): Payer: 59

## 2021-04-19 ENCOUNTER — Encounter: Payer: Self-pay | Admitting: Orthopedic Surgery

## 2021-04-19 ENCOUNTER — Ambulatory Visit (INDEPENDENT_AMBULATORY_CARE_PROVIDER_SITE_OTHER): Payer: 59 | Admitting: Orthopedic Surgery

## 2021-04-19 ENCOUNTER — Other Ambulatory Visit: Payer: Self-pay

## 2021-04-19 DIAGNOSIS — M79672 Pain in left foot: Secondary | ICD-10-CM

## 2021-04-19 DIAGNOSIS — M6701 Short Achilles tendon (acquired), right ankle: Secondary | ICD-10-CM | POA: Diagnosis not present

## 2021-04-19 DIAGNOSIS — M79671 Pain in right foot: Secondary | ICD-10-CM | POA: Diagnosis not present

## 2021-04-19 DIAGNOSIS — M6702 Short Achilles tendon (acquired), left ankle: Secondary | ICD-10-CM | POA: Diagnosis not present

## 2021-04-19 NOTE — Progress Notes (Signed)
Office Visit Note   Patient: Kaylee Salas           Date of Birth: 1966-06-05           MRN: [id number] Visit Date: 04/19/2021              Requested by: Emelda Fear, [place] [address] PCP: Emelda Fear, DO  Chief Complaint  Patient presents with   Left Foot - Pain   Right Foot - Pain      HPI: Patient is a 55 year old woman who was seen for evaluation bilateral foot pain.  She states it is worse over the lateral border of both feet she has had pain for years worse after prolonged standing.  Patient states she stands for prolonged periods of time at work.  Patient states she is going to be going to pain management at the end of September.  Assessment & Plan: Visit Diagnoses:  1. Bilateral foot pain   2. Achilles tendon contracture, bilateral     Plan: Recommended Achilles stretching both on a stool and standing flat-footed.  She will do this 5 times a day a minute at a time.  Discussed that if we cannot stretch out her Achilles with stretching a gastrocnemius recession is an option.  Recommended stiff soled new balance walking sneakers.  Follow-Up Instructions: Return in about 4 weeks (around 05/17/2021).   Ortho Exam  Patient is alert, oriented, no adenopathy, well-dressed, normal affect, normal respiratory effort. Examination patient has good pulses she has splaying of the forefoot bilaterally she does have a cavovarus foot but has good subtalar and ankle range of motion.  With her knee extended she has dorsiflexion 20 degrees short of neutral bilaterally.  Patient cannot stand on her toes with good posterior tibial tendon function.  She has a bunionette deformity bilaterally.  Pain is over the lateral border of her foot with prolonged standing.  Imaging: No results found. No images are attached to the encounter.  Labs: No results found for: HGBA1C, ESRSEDRATE, CRP, LABURIC, REPTSTATUS, GRAMSTAIN, CULT, LABORGA   No results found for:  ALBUMIN, PREALBUMIN, CBC  No results found for: MG No results found for: VD25OH  No results found for: PREALBUMIN CBC EXTENDED Latest Ref Rng & Units 10/31/2018 10/19/2018  WBC 4.0 - 10.5 K/uL 12.7(H) 8.2  RBC 3.87 - 5.11 MIL/uL 3.47(L) 4.28  HGB 12.0 - 15.0 g/dL 10.4(L) 13.3  HCT 36.0 - 46.0 % 32.2(L) 40.0  PLT 150 - 400 K/uL 265 377     There is no height or weight on file to calculate BMI.  Orders:  Orders Placed This Encounter  Procedures   XR Foot 2 Views Left   XR Foot 2 Views Right   No orders of the defined types were placed in this encounter.    Procedures: No procedures performed  Clinical Data: No additional findings.  ROS:  All other systems negative, except as noted in the HPI. Review of Systems  Objective: Vital Signs: There were no vitals taken for this visit.  Specialty Comments:  No specialty comments available.  PMFS History: Patient Active Problem List   Diagnosis Date Noted   Unilateral primary osteoarthritis, right hip 03/24/2021   Unilateral primary osteoarthritis, right knee 05/27/2020   Unilateral primary osteoarthritis, left hip 10/30/2018   Status post total replacement of left hip 10/30/2018   Past Medical History:  Diagnosis Date   Anxiety    Arthritis    Complication of  anesthesia    "woke up panicking/fighting"   Depression    Unilateral primary osteoarthritis, left hip 10/30/2018    History reviewed. No pertinent family history.  Past Surgical History:  Procedure Laterality Date   CHOLECYSTECTOMY  2004   GASTRIC BYPASS     HAND SURGERY Right    ring finger reconstruction   HERNIA REPAIR     KNEE ARTHROSCOPY W/ ACL RECONSTRUCTION Right 1997   TOTAL HIP ARTHROPLASTY Left 10/30/2018   Procedure: LEFT TOTAL HIP ARTHROPLASTY ANTERIOR APPROACH;  Surgeon: Mcarthur Rossetti, MD;  Location: [facility];  Service: Orthopedics;  Laterality: Left;   UMBILICAL HERNIA REPAIR  2004   Social History   Occupational History   Not on  file  Tobacco Use   Smoking status: Never   Smokeless tobacco: Never  Vaping Use   Vaping Use: Never used  Substance and Sexual Activity   Alcohol use: Not Currently   Drug use: Never   Sexual activity: Not on file

## 2021-04-23 ENCOUNTER — Telehealth: Payer: Self-pay | Admitting: Orthopaedic Surgery

## 2021-04-23 NOTE — Telephone Encounter (Signed)
Pt called wanting to know if Dr.Blackman does SI injections in the back? Pt also states she had a L hip replacement and she is till having a lot of pain in her piriformis muscle, so she was wanting to know if there are any injections she could get for her hip?   [phone]

## 2021-04-23 NOTE — Telephone Encounter (Signed)
Please advise 

## 2021-04-23 NOTE — Telephone Encounter (Signed)
Lvm for pt to cb to inform 

## 2021-04-23 NOTE — Telephone Encounter (Signed)
Patient called. She says she will wait to decide if she will do the SI injections with Dr. Ernestina Patches. Her call back number is [phone]

## 2021-05-10 ENCOUNTER — Ambulatory Visit: Payer: 59 | Admitting: Physician Assistant

## 2021-05-10 ENCOUNTER — Other Ambulatory Visit: Payer: Self-pay | Admitting: Orthopaedic Surgery

## 2021-05-10 ENCOUNTER — Telehealth: Payer: Self-pay | Admitting: Orthopaedic Surgery

## 2021-05-10 MED ORDER — TRAMADOL HCL 50 MG PO TABS: 50.0000 mg | ORAL_TABLET | Freq: Four times a day (QID) | ORAL | 0 refills | Status: DC | PRN

## 2021-05-10 NOTE — Telephone Encounter (Signed)
Patient aware of the below message  

## 2021-05-10 NOTE — Telephone Encounter (Signed)
Pt asking for a refill on her tramdol prescription and wanted to see if there was anything that can help with the massive amount of swelling with her feet. Pt thinks it might be were she is walking on [place] all day. The best pharmacy is the [facility] in [place] and the best call back number is [phone].

## 2021-05-18 ENCOUNTER — Other Ambulatory Visit: Payer: Self-pay

## 2021-05-18 ENCOUNTER — Encounter: Payer: 59 | Attending: Physical Medicine & Rehabilitation | Admitting: Physical Medicine & Rehabilitation

## 2021-05-18 ENCOUNTER — Encounter: Payer: Self-pay | Admitting: Physical Medicine & Rehabilitation

## 2021-05-18 VITALS — BP 111/79 | HR 99 | Ht 61.0 in | Wt 196.0 lb

## 2021-05-18 DIAGNOSIS — M533 Sacrococcygeal disorders, not elsewhere classified: Secondary | ICD-10-CM | POA: Diagnosis present

## 2021-05-18 DIAGNOSIS — G8929 Other chronic pain: Secondary | ICD-10-CM | POA: Insufficient documentation

## 2021-05-18 NOTE — Progress Notes (Signed)
Subjective:    Patient ID: Kaylee Salas, female    DOB: 1966/07/29, 55 y.o.   MRN: [id number]  HPI CC:  Right knee pain  55 year old female referred by orthopedics, Dr. Ninfa Linden for the evaluation of right knee pain.  The patient states that she has many painful joints including both hips and both knees as well as right shoulder. Hx Left hip THA 10/30/2018 Right hip end stage OA, recommendations for total hip arthroplasty however no date has been set. RIght knee end stage OA, according to patient this would be addressed after the right hip THA  Ankle xray Normal Right and left July 2022, has been evaluated by Dr. Sharol Given Works part time as a English as a second language teacher, has had difficulty working full-time due to her multiple joint pains.  Hx gastric bypass 2003 lost 140 lb, has regained some of that weight because of poor exercise tolerance related to her lower extremity joint pain  BMI 37  Tried aquatic therapy which did not help with hip  Goals are to do complex ADLs comfortably  Feels like she does not get much relief from tramadol, she did get good relief with hydrocodone, has not tried codeine, has not tried [employer] patch, no known allergies   PHQ-9 score of 9, interpretation mild depression Opioid risk tool score 0 today Pain Inventory Average Pain 7 Pain Right Now 6 My pain is constant, sharp, burning, dull, stabbing, tingling, and aching  In the last 24 hours, has pain interfered with the following? General activity 10 Relation with others 10 Enjoyment of life 10 What TIME of day is your pain at its worst? evening and night Sleep (in general) Poor  Pain is worse with: walking, bending, sitting, inactivity, standing, and some activites Pain improves with: medication and injections Relief from Meds: 8  walk without assistance how many minutes can you walk? 10 minutes ability to climb steps?  yes do you drive?  yes  what is your job? [employer] 15 hrs per week I need assistance  with the following:  household duties and shopping Do you have any goals in this area?  yes  bladder control problems weakness tingling trouble walking spasms anxiety  Any changes since last visit?  yes New Patient ([place])t  Any changes since last visit?  no New Patient    History reviewed. No pertinent family history. Social History   Socioeconomic History   Marital status: Single    Spouse name: Not on file   Number of children: Not on file   Years of education: Not on file   Highest education level: Not on file  Occupational History   Not on file  Tobacco Use   Smoking status: Never   Smokeless tobacco: Never  Vaping Use   Vaping Use: Never used  Substance and Sexual Activity   Alcohol use: Not Currently   Drug use: Never   Sexual activity: Not on file  Other Topics Concern   Not on file  Social History Narrative   Not on file   Social Determinants of Health   Financial Resource Strain: Not on file  Food Insecurity: Not on file  Transportation Needs: Not on file  Physical Activity: Not on file  Stress: Not on file  Social Connections: Not on file   Past Surgical History:  Procedure Laterality Date   CHOLECYSTECTOMY  2004   GASTRIC BYPASS     HAND SURGERY Right    ring finger reconstruction   HERNIA REPAIR  KNEE ARTHROSCOPY W/ ACL RECONSTRUCTION Right 1997   TOTAL HIP ARTHROPLASTY Left 10/30/2018   Procedure: LEFT TOTAL HIP ARTHROPLASTY ANTERIOR APPROACH;  Surgeon: Mcarthur Rossetti, MD;  Location: [facility];  Service: Orthopedics;  Laterality: Left;   UMBILICAL HERNIA REPAIR  2004   Past Medical History:  Diagnosis Date   Anxiety    Arthritis    Complication of anesthesia    "woke up panicking/fighting"   Depression    Unilateral primary osteoarthritis, left hip 10/30/2018   BP 111/79   Pulse 99   Ht 5\' 1"  (1.549 m)   Wt 196 lb (88.9 kg)   BMI 37.03 kg/m   Opioid Risk Score:   Fall Risk Score:  `1  Depression screen PHQ  2/9  Depression screen PHQ 2/9 05/18/2021  Down, Depressed, Hopeless 0  PHQ - 2 Score 0    Review of Systems  Musculoskeletal:  Positive for arthralgias, back pain and gait problem.  All other systems reviewed and are negative.     Objective:   Physical Exam Vitals and nursing note reviewed.  Constitutional:      Appearance: She is obese.  HENT:     Head: Normocephalic and atraumatic.     Mouth/Throat:     Mouth: Mucous membranes are moist.  Eyes:     Extraocular Movements: Extraocular movements intact.     Conjunctiva/sclera: Conjunctivae normal.     Pupils: Pupils are equal, round, and reactive to light.  Cardiovascular:     Rate and Rhythm: Normal rate and regular rhythm.     Heart sounds: Normal heart sounds.  Pulmonary:     Effort: Pulmonary effort is normal.     Breath sounds: Normal breath sounds.  Abdominal:     General: Abdomen is flat. Bowel sounds are normal.     Palpations: Abdomen is soft.  Musculoskeletal:     Cervical back: Normal range of motion. No tenderness.     Comments: Right knee without effusion there is positive medial joint line tenderness no tenderness over the patella or over the quadricep or patellar tendon.  Right hip has limited internal rotation accompanied by pain Left hip his mildly limited internal rotation but not accompanied by pain There is pain noted in the right greater trochanter area, mild Positive thigh thrust test left greater than right hip.  Skin:    General: Skin is warm and dry.  Neurological:     Mental Status: She is alert and oriented to person, place, and time.     Comments: Motor strength is 5/5 bilateral deltoid, bicep, tricep, grip 4/5 bilateral hip flexor knee extensor 5/5 bilateral ankle dorsiflexor and plantar flexor.  Proximal strength testing limited by pain Sensation intact light touch and pinprick bilateral upper limbs as well as bilateral L2-L3-L4 L5-S1 dermatomal distribution.  Psychiatric:        Mood and  Affect: Mood normal.        Behavior: Behavior normal.          Assessment & Plan:  1.  End-stage osteoarthritis right knee has not responded to corticosteroid injection or Synvisc injection.  Has failed physical therapy, she gets partial relief with tramadol better relief with hydrocodone.  Pain limits activity is including work and complex ADLs.  We will schedule for right genicular nerve block Check UDS if appropriate consider buprenorphine patch to avoid excess sedation during work activities  2.  Right hip osteoarthritis awaiting surgery have encouraged patient to move forward with this as she  has run out of conservative care options on this.  Joint pain has been interfering with work activities.  3.  Back and hip pain does have some findings consistent with sacroiliac disorder we will be treating primary pain complaints traction addressing this issue.

## 2021-05-23 LAB — TOXASSURE SELECT,+ANTIDEPR,UR

## 2021-05-26 ENCOUNTER — Encounter: Payer: Self-pay | Admitting: Physician Assistant

## 2021-05-26 ENCOUNTER — Other Ambulatory Visit: Payer: Self-pay

## 2021-05-26 ENCOUNTER — Ambulatory Visit (INDEPENDENT_AMBULATORY_CARE_PROVIDER_SITE_OTHER): Payer: 59 | Admitting: Physician Assistant

## 2021-05-26 ENCOUNTER — Ambulatory Visit: Payer: Self-pay

## 2021-05-26 DIAGNOSIS — M533 Sacrococcygeal disorders, not elsewhere classified: Secondary | ICD-10-CM | POA: Diagnosis not present

## 2021-05-26 DIAGNOSIS — M25562 Pain in left knee: Secondary | ICD-10-CM | POA: Diagnosis not present

## 2021-05-26 DIAGNOSIS — G8929 Other chronic pain: Secondary | ICD-10-CM | POA: Diagnosis not present

## 2021-05-26 MED ORDER — METHYLPREDNISOLONE ACETATE 40 MG/ML IJ SUSP
40.0000 mg | INTRAMUSCULAR | Status: AC | PRN
  Administered 2021-05-26: 40 mg via INTRA_ARTICULAR

## 2021-05-26 MED ORDER — LIDOCAINE HCL 1 % IJ SOLN
3.0000 mL | INTRAMUSCULAR | Status: AC | PRN
  Administered 2021-05-26: 3 mL

## 2021-05-26 NOTE — Progress Notes (Signed)
Office Visit Note   Patient: Kaylee Salas           Date of Birth: [birth year]           MRN: [id number] Visit Date: 05/26/2021              Requested by: Kaylee Fear, [place] [address] PCP: Kaylee Fear, DO   Assessment & Plan: Visit Diagnoses:  1. Acute pain of left knee   2. Chronic left SI joint pain     Plan: We will see how she does with the left knee injection.  Neck step would be supplemental injection if she fails cortisone injection.  However she understands she can have cortisone injections as often as every 3 months if need be as long as she is getting significant relief.  She is also asking about a possible SI joint injection with Dr. Ernestina Salas here.  She had one scheduled elsewhere but decided not to have the the injection.  We will try to schedule a left SI joint injection for her in the near future.  Questions were encouraged and answered at length.  Follow-Up Instructions: Return if symptoms worsen or fail to improve.   Orders:  Orders Placed This Encounter  Procedures  . XR Knee 1-2 Views Left   No orders of the defined types were placed in this encounter.     Procedures: Large Joint Inj on 05/26/2021 5:21 PM Indications: pain Details: 22 G 1.5 in needle, anterolateral approach  Arthrogram: No  Medications: 3 mL lidocaine 1 %; 40 mg methylPREDNISolone acetate 40 MG/ML Outcome: tolerated well, no immediate complications Procedure, treatment alternatives, risks and benefits explained, specific risks discussed. Consent was given by the patient. Immediately prior to procedure a time out was called to verify the correct patient, procedure, equipment, support staff and site/side marked as required. Patient was prepped and draped in the usual sterile fashion.      Clinical Data: No additional findings.   Subjective: Chief Complaint  Patient presents with  . Left Knee - Pain    HPI Kaylee Salas returns today with new complaint  of left knee pain.  She is requesting cortisone injection in the knee.  She said no new injury.  States pain started in the knee about a month ago she had some swelling.  Is worse whenever she is at work she does work on hard concrete floors.  She has sharp pain medial aspect the knee with pivoting.  No known injury.  She is nondiabetic.  He is also having some left buttocks pain points over her SI joint and is requesting injection in the SI joint she had a scheduled elsewhere in the past but "chickened out". Review of Systems See HPI otherwise negative denies any fevers or chills.  Objective: Vital Signs: There were no vitals taken for this visit.  Physical Exam General well-developed well-nourished female no acute distress walks with antalgic gait. Ortho Exam Bilateral knees good range of motion of both knees.  No gross instability valgus varus stressing of either knee.  Bilateral knees patellofemoral crepitus.  Tenderness along the medial joint line of the left knee.  No abnormal warmth erythema or effusion left knee. Specialty Comments:  No specialty comments available.  Imaging: XR Knee 1-2 Views Left  Result Date: 05/26/2021 Left knee 2 views: Shows near bone-on-bone medial compartment.  Mild to moderate lateral compartmental arthritic changes.  Moderate to severe patellofemoral changes.  No acute fractures.  Knee is well located.    PMFS History: Patient Active Problem List   Diagnosis Date Noted  . Unilateral primary osteoarthritis, right hip 03/24/2021  . Unilateral primary osteoarthritis, right knee 05/27/2020  . Unilateral primary osteoarthritis, left hip 10/30/2018  . Status post total replacement of left hip 10/30/2018   Past Medical History:  Diagnosis Date  . Anxiety   . Arthritis   . Complication of anesthesia    "woke up panicking/fighting"  . Depression   . Unilateral primary osteoarthritis, left hip 10/30/2018    History reviewed. No pertinent family history.   Past Surgical History:  Procedure Laterality Date  . CHOLECYSTECTOMY  2004  . GASTRIC BYPASS    . HAND SURGERY Right    ring finger reconstruction  . HERNIA REPAIR    . KNEE ARTHROSCOPY W/ ACL RECONSTRUCTION Right 1997  . TOTAL HIP ARTHROPLASTY Left 10/30/2018   Procedure: LEFT TOTAL HIP ARTHROPLASTY ANTERIOR APPROACH;  Surgeon: Mcarthur Rossetti, MD;  Location: [facility];  Service: Orthopedics;  Laterality: Left;  . UMBILICAL HERNIA REPAIR  2004   Social History   Occupational History  . Not on file  Tobacco Use  . Smoking status: Never  . Smokeless tobacco: Never  Vaping Use  . Vaping Use: Never used  Substance and Sexual Activity  . Alcohol use: Not Currently  . Drug use: Never  . Sexual activity: Not on file

## 2021-05-27 ENCOUNTER — Telehealth: Payer: Self-pay | Admitting: Physical Medicine & Rehabilitation

## 2021-05-27 MED ORDER — BUPRENORPHINE 10 MCG/HR TD PTWK: 1.0000 | MEDICATED_PATCH | TRANSDERMAL | 1 refills | Status: DC

## 2021-05-27 NOTE — Addendum Note (Signed)
Addended by: Robyne Peers on: 05/27/2021 08:21 AM   Modules accepted: Orders

## 2021-05-27 NOTE — Telephone Encounter (Signed)
Patient would like to know if her UDS has come back, she is needing something for her knee pain.  Please call patient.

## 2021-05-27 NOTE — Telephone Encounter (Signed)
Notified Ms Huisman of the medication sent to her pharmacy by Dr Letta Pate.

## 2021-05-31 ENCOUNTER — Telehealth: Payer: Self-pay | Admitting: Physical Medicine & Rehabilitation

## 2021-05-31 ENCOUNTER — Telehealth: Payer: Self-pay

## 2021-05-31 NOTE — Telephone Encounter (Signed)
Rx not on insurance list. Forms filled out to over-ride. Forms faxed.  Waiting on reply.

## 2021-05-31 NOTE — Telephone Encounter (Signed)
Rx Buprenorphine is not on [employer] plan. She will have to wait to see if its approved by PA. Patient is asking if you will send in a few Oxycodone? Is hard for her to walk the concrete floors.    Call back phone [phone].

## 2021-05-31 NOTE — Telephone Encounter (Signed)
Pt has request Rx for her appt on 10/18.

## 2021-05-31 NOTE — Telephone Encounter (Signed)
PA in progress. 

## 2021-05-31 NOTE — Telephone Encounter (Signed)
Patients medication for Butrans was sent to wrong pharmacy.  It should have been sent to [facility].  [facility]?  Please call patient.  This may also need prior auth.

## 2021-06-01 ENCOUNTER — Telehealth: Payer: Self-pay | Admitting: Physical Medicine and Rehabilitation

## 2021-06-01 ENCOUNTER — Other Ambulatory Visit: Payer: Self-pay | Admitting: Orthopaedic Surgery

## 2021-06-01 MED ORDER — ACETAMINOPHEN-CODEINE #3 300-30 MG PO TABS: 1.0000 | ORAL_TABLET | Freq: Three times a day (TID) | ORAL | 0 refills | Status: DC

## 2021-06-01 NOTE — Telephone Encounter (Signed)
Pt is wanting to know if she can schedule the right side of the SI joint too. She has some questions [phone]

## 2021-06-01 NOTE — Telephone Encounter (Signed)
ok 

## 2021-06-01 NOTE — Addendum Note (Signed)
Addended by: Franchot Gallo on: 06/01/2021 09:26 AM   Modules accepted: Orders

## 2021-06-02 NOTE — Telephone Encounter (Signed)
Left message to advise

## 2021-06-02 NOTE — Telephone Encounter (Signed)
Patient is schedule for left SI joint on 10/18. Ok to add right?

## 2021-06-03 ENCOUNTER — Telehealth: Payer: Self-pay

## 2021-06-03 NOTE — Telephone Encounter (Signed)
Still waiting on PA response. Will call [employer] back today [phone], [phone].

## 2021-06-03 NOTE — Telephone Encounter (Signed)
Buprenorphine 10 Mcg patch has been denied.    Patient has been advised to call the insurance company. To see what other option in the same class are available.That she is willing to pay. Then let us know.   Patient is current taking Tylenol #3. Do you want to submit another alternative?

## 2021-06-08 MED ORDER — ACETAMINOPHEN-CODEINE #4 300-60 MG PO TABS: 1.0000 | ORAL_TABLET | Freq: Three times a day (TID) | ORAL | 0 refills | Status: DC | PRN

## 2021-06-08 NOTE — Telephone Encounter (Signed)
Mrs Newton notified of medication change.

## 2021-06-08 NOTE — Addendum Note (Signed)
Addended by: Caro Hight on: 06/08/2021 10:49 AM   Modules accepted: Orders

## 2021-06-11 MED ORDER — DIAZEPAM 5 MG PO TABS: ORAL_TABLET | ORAL | 0 refills | Status: DC

## 2021-06-11 NOTE — Addendum Note (Signed)
Addended by: Raymondo Band on: 06/11/2021 10:04 AM   Modules accepted: Orders

## 2021-06-15 ENCOUNTER — Ambulatory Visit: Payer: Self-pay

## 2021-06-15 ENCOUNTER — Telehealth: Payer: Self-pay | Admitting: Physical Medicine and Rehabilitation

## 2021-06-15 ENCOUNTER — Ambulatory Visit (INDEPENDENT_AMBULATORY_CARE_PROVIDER_SITE_OTHER): Payer: 59 | Admitting: Physical Medicine and Rehabilitation

## 2021-06-15 ENCOUNTER — Encounter: Payer: Self-pay | Admitting: Physical Medicine and Rehabilitation

## 2021-06-15 ENCOUNTER — Other Ambulatory Visit: Payer: Self-pay

## 2021-06-15 DIAGNOSIS — M461 Sacroiliitis, not elsewhere classified: Secondary | ICD-10-CM

## 2021-06-15 NOTE — Telephone Encounter (Signed)
Called patient to advise that the only restriction after an SI joint injection is to not submerge in water for 24 hours.

## 2021-06-15 NOTE — Telephone Encounter (Signed)
Patient would like to know if she has any restrictions for today. She just left from seeing Dr. Ernestina Patches.

## 2021-06-15 NOTE — Progress Notes (Signed)
Pt state lower back pain that travels to her hips and buttocks, mostly left side. Pt state walking, standing and laying down. Pt state excise and pain meds to help ease her pain.   Numeric Pain Rating Scale and Functional Assessment Average Pain 7   In the last MONTH (on 0-10 scale) has pain interfered with the following?  1. General activity like being  able to carry out your everyday physical activities such as walking, climbing stairs, carrying groceries, or moving a chair?  Rating(10)   -Driver, -BT, -Dye Allergies.

## 2021-06-16 ENCOUNTER — Telehealth: Payer: Self-pay | Admitting: Physical Medicine & Rehabilitation

## 2021-06-16 NOTE — Telephone Encounter (Signed)
I have left patient a voicemail stating that we have sent up prior authorization to [employer] for her knee injection with Dr. Letta Pate.  I did tell her that it could take a couple of business days for her insurance company to process this.

## 2021-06-17 ENCOUNTER — Telehealth: Payer: Self-pay | Admitting: Orthopaedic Surgery

## 2021-06-17 NOTE — Telephone Encounter (Signed)
Patient called advised she would like to get the hip injections again and would like to know if dr Ernestina Patches could do the  inner joint injections since Dr Junius Roads is no longer with [facility].  Patient said she did see Dr Delynn Flavin in pain management. She said Dr. Delynn Flavin is going to do a nerve block on November 8th on her right knee.  The number to contact patient is [phone]

## 2021-06-18 ENCOUNTER — Other Ambulatory Visit: Payer: Self-pay

## 2021-06-18 DIAGNOSIS — G8929 Other chronic pain: Secondary | ICD-10-CM

## 2021-06-21 ENCOUNTER — Telehealth: Payer: Self-pay | Admitting: *Deleted

## 2021-06-21 NOTE — Telephone Encounter (Signed)
Ms Daoud called to request a prior auth be submitted for her Tylenol #4 (90).  Submitted to her insurance via CoverMyMeds.

## 2021-06-25 MED ORDER — BELBUCA 150 MCG BU FILM: 1.0000 | ORAL_FILM | Freq: Two times a day (BID) | BUCCAL | 0 refills | Status: DC

## 2021-06-25 NOTE — Telephone Encounter (Signed)
BELBUCA:  PA Case: [id number], Status: Approved, Coverage Starts on: 06/25/2021 12:00 AM, Coverage Ends on: 12/24/2021 12:00 AM. Questions? Contact [phone].

## 2021-06-25 NOTE — Telephone Encounter (Signed)
There have been many roadblocks getting the Tylenol #4 approved. Kaylee Salas was only able to fill 7 days at a time and was being told the insurance would respond in 5 days and by the time the pharmacy was able to fill again she had run out of medication. It turns out the pharmacy was filling under her [place] and not under her [employer]. I spoke with the pharmacy administrator of [employer] and they would approve the generic Tyl # 4 at full Rx since she has had the 7 day trial. [facility] and spoke with them on the phone while they entered the information.  In speaking again with Kaylee Salas she brought up the buprenorphine patch that was denied. I was able to pull her formulary and [employer] does not cover BUTRANS Patch but will cover Belbuca at Tier 3 cost. She would like me to bring this to Kaylee Salas attention because she is just wanting to be able to function to work until she can get her joint replaced.

## 2021-06-25 NOTE — Telephone Encounter (Signed)
Per Dr Letta Pate I have called in an order for the Belbuca 150 mcg 1 q 12 hours #60. I have submitted a prior auth via CoverMyMeds even though it is on the formulary. Tylenol #4 will be discontinued once she has the [place].I have notified Mrs Yarrow.

## 2021-06-28 DIAGNOSIS — M461 Sacroiliitis, not elsewhere classified: Secondary | ICD-10-CM

## 2021-06-28 MED ORDER — BUPIVACAINE HCL 0.25 % IJ SOLN
3.0000 mL | INTRAMUSCULAR | Status: AC | PRN
  Administered 2021-06-28: 3 mL via INTRA_ARTICULAR

## 2021-06-28 MED ORDER — TRIAMCINOLONE ACETONIDE 40 MG/ML IJ SUSP
40.0000 mg | INTRAMUSCULAR | Status: AC | PRN
  Administered 2021-06-28: 40 mg via INTRA_ARTICULAR

## 2021-06-28 NOTE — Progress Notes (Signed)
Kaylee Salas - 55 y.o. female MRN [id number]  Date of birth: June 20, 1966  Office Visit Note: Visit Date: 06/15/2021 PCP: Emelda Fear, DO Referred by: Emelda Fear, DO  Subjective: Chief Complaint  Patient presents with   Lower Back - Pain   HPI:  Kaylee Salas is a 54 y.o. female who comes in today at the request of Benita Stabile, PA-C for planned Bilateral sacroiliac joint arthrograms with fluoroscopic guidance.  The patient has failed conservative care including home exercise, medications, time and activity modification.  This injection will be diagnostic and hopefully therapeutic.  Please see requesting physician notes for further details and justification.   ROS Otherwise per HPI.  Assessment & Plan: Visit Diagnoses:    ICD-10-CM   1. Sacroiliitis ([place])  M46.1 XR C-ARM NO REPORT      Plan: No additional findings.   Meds & Orders: No orders of the defined types were placed in this encounter.   Orders Placed This Encounter  Procedures   Sacroiliac Joint Inj   XR C-ARM NO REPORT    Follow-up: Return if symptoms worsen or fail to improve.   Procedures: Sacroiliac Joint Inj on 06/28/2021 12:51 PM Indications: pain and diagnostic evaluation Details: 22 G 3.5 in needle, fluoroscopy-guided posterior approach Medications (Right): 40 mg triamcinolone acetonide 40 MG/ML; 3 mL bupivacaine 0.25 % Medications (Left): 40 mg triamcinolone acetonide 40 MG/ML; 3 mL bupivacaine 0.25 % Outcome: tolerated well, no immediate complications  Sacroiliac Joint Intra-Articular Injection - Posterior Approach with Fluoroscopic Guidance   Position: PRONE  Additional Comments: Vital signs were monitored before and after the procedure. Patient was prepped and draped in the usual sterile fashion. The correct patient, procedure, and site was verified.   Injection Procedure Details:   Location/Site:  Sacroiliac joint  Needle size: 3.5 in Spinal Needle  Needle type: Spinal  Needle  Placement: Intra-articular  Findings:  -Comments: There was excellent flow of contrast producing a partial arthrogram of the sacroiliac joint.   Procedure Details: Starting with a 90 degree vertical and midline orientation the fluoroscope was tilted cranially 20 to 25 degrees and the target area of the inferior most part of the SI joint on the side mentioned above was visualized.  The soft tissues overlying this target were infiltrated with 4 ml. of 1% Lidocaine without Epinephrine. A #22 gauge spinal needle was inserted perpendicular to the fluoroscope table and advanced into the posterior inferior joint space using fluoroscopic guidance.  Position in the joint space was confirmed by obtaining a partial arthrogram using a 2 ml. volume of Isovue-250 contrast agent. After negative aspirate for gross pus or blood, the injectate was delivered to the joint. Radiographs were obtained for documentation purposes.   Additional Comments:   Dressing: Bandaid    Post-procedure details: Patient was observed during the procedure. Post-procedure instructions were reviewed.  Patient left the clinic in stable condition.    There was excellent flow of contrast producing a partial arthrogram of the sacroiliac joint.  Procedure, treatment alternatives, risks and benefits explained, specific risks discussed. Consent was given by the patient. Immediately prior to procedure a time out was called to verify the correct patient, procedure, equipment, support staff and site/side marked as required. Patient was prepped and draped in the usual sterile fashion.         Clinical History: No specialty comments available.     Objective:  VS:  HT:    WT:   BMI:     BP:  HR: bpm  TEMP: ( )  RESP:  Physical Exam Vitals and nursing note reviewed.  Constitutional:      General: She is not in acute distress.    Appearance: Normal appearance. She is not ill-appearing.  HENT:     Head: Normocephalic and  atraumatic.     Right Ear: External ear normal.     Left Ear: External ear normal.  Eyes:     Extraocular Movements: Extraocular movements intact.  Cardiovascular:     Rate and Rhythm: Normal rate.     Pulses: Normal pulses.  Pulmonary:     Effort: Pulmonary effort is normal. No respiratory distress.  Abdominal:     General: There is no distension.     Palpations: Abdomen is soft.  Musculoskeletal:        General: Tenderness present.     Cervical back: Neck supple.     Right lower leg: No edema.     Left lower leg: No edema.     Comments: Patient has good distal strength with no pain over the greater trochanters.  No clonus or focal weakness. Positive Fortin finger sign, Patrick's testing and lateral compression test.    Skin:    Findings: No erythema, lesion or rash.  Neurological:     General: No focal deficit present.     Mental Status: She is alert and oriented to person, place, and time.     Sensory: No sensory deficit.     Motor: No weakness or abnormal muscle tone.     Coordination: Coordination normal.  Psychiatric:        Mood and Affect: Mood normal.        Behavior: Behavior normal.     Imaging: No results found.

## 2021-07-05 ENCOUNTER — Telehealth: Payer: Self-pay
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
## 2021-07-06 ENCOUNTER — Encounter: Payer: 59 | Attending: Physical Medicine & Rehabilitation | Admitting: Physical Medicine & Rehabilitation

## 2021-07-06 ENCOUNTER — Telehealth: Payer: Self-pay | Admitting: Physical Medicine & Rehabilitation

## 2021-07-06 ENCOUNTER — Other Ambulatory Visit: Payer: Self-pay

## 2021-07-06 ENCOUNTER — Encounter: Payer: Self-pay | Admitting: Physical Medicine & Rehabilitation

## 2021-07-06 VITALS — BP 124/65 | HR 80 | Ht 61.0 in | Wt 191.0 lb

## 2021-07-06 DIAGNOSIS — M1711 Unilateral primary osteoarthritis, right knee: Secondary | ICD-10-CM | POA: Diagnosis present

## 2021-07-06 MED ORDER — BELBUCA 150 MCG BU FILM: 1.0000 | ORAL_FILM | Freq: Two times a day (BID) | BUCCAL | 1 refills | Status: DC

## 2021-07-06 MED ORDER — BELBUCA 150 MCG BU FILM: 1.0000 | ORAL_FILM | Freq: Two times a day (BID) | BUCCAL | 0 refills | Status: DC

## 2021-07-06 NOTE — Patient Instructions (Signed)
Genicular nerve blocks were performed today. This is to block pain signals from the knee. A local anesthetic was used to block the knee therefore this will not be a permanent procedure. Please keep track of your pain today and compared to the pain that you had prior to the injection. At next visit we will discuss the results. If it is helpful but only short-term we would need to confirm this with an additional injection prior to proceeding with a radiofrequency neurotomy

## 2021-07-06 NOTE — Addendum Note (Signed)
Addended by: Charlett Blake on: 07/06/2021 04:21 PM   Modules accepted: Orders

## 2021-07-06 NOTE — Progress Notes (Signed)
  PROCEDURE RECORD [facility]   Name: Kaylee Salas DOB:30-Apr-1966 MRN: [id number]  Date:07/06/2021  Physician: Alysia Penna, MD    Nurse/CMA: Truman Hayward, CMA  Allergies: No Known Allergies  Consent Signed: Yes.    Is patient diabetic? No.  CBG today? N/A  Pregnant: No. LMP: No LMP recorded. Patient is postmenopausal. (age 55-55)  Anticoagulants: no Anti-inflammatory: no Antibiotics:  no  Procedure:  RIght knee genicular nerve block   Position: Supine Start Time: 2:32 pm  End Time: 2:48 pm  Fluoro Time: 21  RN/CMA Marvis Moeller, CMA    Time 2:07pm 3:00 pm    BP 124/65 125/68    Pulse 90 88    Respirations 14 14    O2 Sat 98 96    S/S 6 6    Pain Level 10/10 0/10     D/C home with Self, [name] & O X 3, D/C instructions reviewed, and sits independently.

## 2021-07-06 NOTE — Telephone Encounter (Signed)
Patient is asking about refill for Belbuca, please advise when and how to refilll

## 2021-07-06 NOTE — Progress Notes (Signed)
Right genicular nerve blocks under fluoroscopic guidance  Indication Chronic severe post operative knee pain that has not responded to PT, medication, and other conservative care.Xray R knee demonstrate Tricompartmental OA, severe medial and patellofem Minimal responsiveness to IA injection of steroid and viscosupplementation   Informed consent was obtained after discussing risks and benefits of procedure with the patient.  These include bleeding, bruising and infection as well as foot numbness. Pt placed in supine position on the fluoro table .  Static images identified distal femur and proximal tibia.  Lateral and medial supracondylar , as well as medial tibial flare prepped with betadine.  Marked under fluoro and then a 25 g 1.5 inch needle was used to anesthetize skin and subcutaneous tissue. 1.5 cc was infiltrate into each of 3 sites. Then a 22-gauge 3.5 inch needle was inserted under fluoroscopic guidance first targeting the medial tibial flare midpoint. After bone contact was made lateral images confirmed proper positioning and Isovue 200 times one ML was injected. This showed no evidence of intravascular uptake. Then 1.5 ML of the solution containing one ML of Celestone 6 mg per mL with 4 ML of .25%marcaincaine was injected. This same procedure was treated for the lateral and medial supracondylar areas. Patient tolerated procedure well. Post procedure instructions given.  Pre injection pain 10/10 Post injection pain 0/10  Schedule for Genicular RF next month  Pt interested in SI nerve blocks as she has had short term relief with SI IA injections per Dr Ernestina Patches

## 2021-07-14 ENCOUNTER — Other Ambulatory Visit: Payer: Self-pay

## 2021-07-14 ENCOUNTER — Telehealth: Payer: Self-pay | Admitting: Physical Medicine and Rehabilitation

## 2021-07-14 DIAGNOSIS — M533 Sacrococcygeal disorders, not elsewhere classified: Secondary | ICD-10-CM

## 2021-07-14 DIAGNOSIS — G8929 Other chronic pain: Secondary | ICD-10-CM

## 2021-07-14 NOTE — Telephone Encounter (Signed)
Patient called asked if Dr. Ninfa Linden or Artis Delay can refer her to Dr Delynn Flavin for a nerve block for her SI joints on both sides.  The number to contact patient is [phone].

## 2021-07-14 NOTE — Telephone Encounter (Signed)
Sent order to [facility]

## 2021-07-19 ENCOUNTER — Telehealth: Payer: Self-pay

## 2021-07-19 ENCOUNTER — Telehealth: Payer: Self-pay | Admitting: Physical Medicine and Rehabilitation

## 2021-07-19 NOTE — Telephone Encounter (Signed)
Pt request Rx for appt 12/13.

## 2021-07-19 NOTE — Telephone Encounter (Signed)
Requesting a call back to schedule inner hip injection.   [phone]

## 2021-07-20 ENCOUNTER — Telehealth: Payer: Self-pay

## 2021-07-20 NOTE — Telephone Encounter (Signed)
Patient is calling to request a refill of Belbuca 150 mcg film sent to [facility] [address] in [place], [place]. Last fill was 06/28/21

## 2021-07-20 NOTE — Telephone Encounter (Signed)
From what I can tell, patient is scheduled for genicular RF with Dr. Letta Pate next month. Per his last note, "Pt interested in SI nerve blocks as she has had short term relief with SI IA injections per Dr Ernestina Patches"

## 2021-07-21 NOTE — Telephone Encounter (Signed)
Informed patient that it was too early to fill prescription of Belbuca. Patient verbalized understanding and stated she will call back on Monday to request refill.

## 2021-07-26 ENCOUNTER — Telehealth: Payer: Self-pay | Admitting: Physical Medicine & Rehabilitation

## 2021-07-26 NOTE — Telephone Encounter (Signed)
Patient called [phone] she needs refill of Belbuca Subling 150 microgram -- she knows there is an issue with PA taking time -- [facility].

## 2021-07-30 NOTE — Telephone Encounter (Signed)
Patient called stating she doesn't want a refill on Belbucca due to her PCP doesn't think its safe for her to take eventhough it did work for her pain, will discuss at next visit. Now she has nothing for pain. Also she states her PCP discontinued Clonzepam.

## 2021-08-02 ENCOUNTER — Telehealth: Payer: Self-pay | Admitting: Orthopaedic Surgery

## 2021-08-02 NOTE — Telephone Encounter (Signed)
Is pt scheduled for surgery?

## 2021-08-02 NOTE — Telephone Encounter (Signed)
Pt called and giving an FYI for her surgery. Pt is waiting to get assistance because she is working part time and cant afford to take time off until she has some type of income. Pt phone number is [phone].

## 2021-08-02 NOTE — Telephone Encounter (Signed)
RF scheduled 09/09/21

## 2021-08-03 MED ORDER — ACETAMINOPHEN-CODEINE #4 300-60 MG PO TABS: 1.0000 | ORAL_TABLET | Freq: Two times a day (BID) | ORAL | 2 refills | Status: DC | PRN

## 2021-08-03 NOTE — Telephone Encounter (Signed)
I called and talked to the pt. Dr. Ninfa Linden had discussed replacing her hip and knee. She will work on getting help but until then she will see Dr. Ninfa Linden in [place] for a repeat knee injection

## 2021-08-06 ENCOUNTER — Other Ambulatory Visit: Payer: Self-pay | Admitting: Physical Medicine and Rehabilitation

## 2021-08-09 MED ORDER — DIAZEPAM 5 MG PO TABS: ORAL_TABLET | ORAL | 0 refills | Status: DC

## 2021-08-10 ENCOUNTER — Other Ambulatory Visit: Payer: Self-pay

## 2021-08-10 ENCOUNTER — Ambulatory Visit (INDEPENDENT_AMBULATORY_CARE_PROVIDER_SITE_OTHER): Payer: 59 | Admitting: Physical Medicine and Rehabilitation

## 2021-08-10 ENCOUNTER — Encounter: Payer: Self-pay | Admitting: Physical Medicine and Rehabilitation

## 2021-08-10 ENCOUNTER — Ambulatory Visit: Payer: Self-pay

## 2021-08-10 DIAGNOSIS — M461 Sacroiliitis, not elsewhere classified: Secondary | ICD-10-CM | POA: Diagnosis not present

## 2021-08-10 NOTE — Patient Instructions (Signed)
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
## 2021-08-17 MED ORDER — BUPIVACAINE HCL 0.5 % IJ SOLN
3.0000 mL | Freq: Once | INTRAMUSCULAR | Status: AC
  Administered 2021-08-17: 07:00:00 3 mL

## 2021-08-17 NOTE — Procedures (Signed)
Sacroiliac Diagnostic Lateral Branch Nerve Block with Fluoroscopic Guidance   Patient: Kaylee Salas      Date of Birth: 09/17/1965 MRN: [id number] PCP: Emelda Fear, DO      Visit Date: 08/10/2021   Universal Protocol:    Date/Time: 12/20/226:31 AM  Consent Given By: the patient  Position: PRONE  Additional Comments: Vital signs were monitored before and after the procedure. Patient was prepped and draped in the usual sterile fashion. The correct patient, procedure, and site was verified.   Injection Procedure Details:  Procedure Site One Meds Administered:  Meds ordered this encounter  Medications   bupivacaine (MARCAINE) 0.5 % (with pres) injection 3 mL     Laterality: Right  Location/Site:  Lateral branches at L5, S1 and S2  Needle size: 22 ga.  Needle type:spinal  Needle Placement: Juncture of sacral ala and inferior articulating process for L5 and just last to foramen for S1 and S2  Findings:   -Comments: There was excellent flow of contrast along the articular pillars without intravascular flow.  Procedure Details: The fluoroscope beam is vertically oriented in AP and then obliqued 15 to 20 degrees to the ipsilateral side of the desired nerve to achieve the Scotty dog appearance.  The skin over the target area of the junction of the superior articulating process and the sacral ala if blocking the L5 dorsal rami was locally anesthetized with a 1 ml volume of 1% Lidocaine without Epinephrine.  The spinal needle was inserted and advanced in a trajectory view down to the target. Similar approach for S1 and S1 with target area just lateral to respective foramen.  After contact with periosteum and negative aspirate for blood and CSF, correct placement without intravascular or epidural spread was confirmed by injecting 0.5 ml. of Isovue-250.  A spot radiograph was obtained of this image.    Next, a 0.5 ml. volume of the injectate described above was injected at each  target location.  Prior to the procedure, the patient was given a Pain Diary which was completed for baseline measurements.  After the procedure, the patient rated their pain every 30 minutes and will continue rating at this frequency for a total of 5 hours.  The patient has been asked to complete the Diary and return to [place] by mail, fax or hand delivered as soon as possible.   Additional Comments:  No complications occurred Dressing: 2 x 2 sterile gauze and Band-Aid    Post-procedure details: Patient was observed during the procedure. Post-procedure instructions were reviewed.  Patient left the clinic in stable condition.

## 2021-08-17 NOTE — Progress Notes (Signed)
[name] - 55 y.o. female MRN [id number]  Date of birth: Jan 08, 1966  Office Visit Note: Visit Date: 08/10/2021 PCP: Emelda Fear, DO Referred by: Emelda Fear, DO  Subjective: Chief Complaint  Patient presents with   Lower Back - Pain   HPI:  Kaylee Salas is a 55 y.o. female who comes in today for planned repeat Right lateral branch block at L5, S1 and S2 with fluoroscopic guidance to effectively block the right sacroiliac joint..  The patient has failed conservative care including home exercise, medications, time and activity modification. Prior injection gave more than 50% relief for several months. This injection will be diagnostic and the second injection in a double block paradigm for radiofrequency ablation of the sacroiliac joint..  Please see requesting physician notes for further details and justification.  Patient also followed by Dr. Alysia Penna and has been having blocks of the knee with potential for ablation of the knee.  We did make it clear to her that she can follow with Dr. Letta Pate if she desires of having to physiatrist competing in different areas.  She wanted to see [place] for her sacroiliac joint since we had completed the first injection.  Referring: Dr. Jean Rosenthal, Benita Stabile, P.A.-C  ROS Otherwise per HPI.  Assessment & Plan: Visit Diagnoses:    ICD-10-CM   1. Sacroiliitis (HCC)  M46.1 XR C-ARM NO REPORT    Nerve Block    bupivacaine (MARCAINE) 0.5 % (with pres) injection 3 mL      Plan: No additional findings.   Meds & Orders:  Meds ordered this encounter  Medications   bupivacaine (MARCAINE) 0.5 % (with pres) injection 3 mL    Orders Placed This Encounter  Procedures   Nerve Block   XR C-ARM NO REPORT    Follow-up: Return for Review Pain Diary.   Procedures: No procedures performed  Sacroiliac Diagnostic Lateral Branch Nerve Block with Fluoroscopic Guidance   Patient: Kaylee Salas      Date of Birth: 12/25/1965 MRN:  [id number] PCP: Emelda Fear, DO      Visit Date: 08/10/2021   Universal Protocol:    Date/Time: 12/20/226:31 AM  Consent Given By: the patient  Position: PRONE  Additional Comments: Vital signs were monitored before and after the procedure. Patient was prepped and draped in the usual sterile fashion. The correct patient, procedure, and site was verified.   Injection Procedure Details:  Procedure Site One Meds Administered:  Meds ordered this encounter  Medications   bupivacaine (MARCAINE) 0.5 % (with pres) injection 3 mL     Laterality: Right  Location/Site:  Lateral branches at L5, S1 and S2  Needle size: 22 ga.  Needle type:spinal  Needle Placement: Juncture of sacral ala and inferior articulating process for L5 and just last to foramen for S1 and S2  Findings:   -Comments: There was excellent flow of contrast along the articular pillars without intravascular flow.  Procedure Details: The fluoroscope beam is vertically oriented in AP and then obliqued 15 to 20 degrees to the ipsilateral side of the desired nerve to achieve the Scotty dog appearance.  The skin over the target area of the junction of the superior articulating process and the sacral ala if blocking the L5 dorsal rami was locally anesthetized with a 1 ml volume of 1% Lidocaine without Epinephrine.  The spinal needle was inserted and advanced in a trajectory view down to the target. Similar approach for S1 and S1 with target area  just lateral to respective foramen.  After contact with periosteum and negative aspirate for blood and CSF, correct placement without intravascular or epidural spread was confirmed by injecting 0.5 ml. of Isovue-250.  A spot radiograph was obtained of this image.    Next, a 0.5 ml. volume of the injectate described above was injected at each target location.  Prior to the procedure, the patient was given a Pain Diary which was completed for baseline measurements.  After the  procedure, the patient rated their pain every 30 minutes and will continue rating at this frequency for a total of 5 hours.  The patient has been asked to complete the Diary and return to [place] by mail, fax or hand delivered as soon as possible.   Additional Comments:  No complications occurred Dressing: 2 x 2 sterile gauze and Band-Aid    Post-procedure details: Patient was observed during the procedure. Post-procedure instructions were reviewed.  Patient left the clinic in stable condition.    Clinical History: No specialty comments available.     Objective:  VS:  HT:     WT:    BMI:      BP:    HR: bpm   TEMP: ( )   RESP:  Physical Exam Vitals and nursing note reviewed.  Constitutional:      General: She is not in acute distress.    Appearance: Normal appearance. She is not ill-appearing.  HENT:     Head: Normocephalic and atraumatic.     Right Ear: External ear normal.     Left Ear: External ear normal.  Eyes:     Extraocular Movements: Extraocular movements intact.  Cardiovascular:     Rate and Rhythm: Normal rate.     Pulses: Normal pulses.  Pulmonary:     Effort: Pulmonary effort is normal. No respiratory distress.  Abdominal:     General: There is no distension.     Palpations: Abdomen is soft.  Musculoskeletal:        General: Tenderness present.     Cervical back: Neck supple.     Right lower leg: No edema.     Left lower leg: No edema.     Comments: Patient has good distal strength with no pain over the greater trochanters.  No clonus or focal weakness.  Positive Fortin finger sign on the right.  Positive Patrick's testing on the right.  Skin:    Findings: No erythema, lesion or rash.  Neurological:     General: No focal deficit present.     Mental Status: She is alert and oriented to person, place, and time.     Sensory: No sensory deficit.     Motor: No weakness or abnormal muscle tone.     Coordination: Coordination normal.  Psychiatric:        Mood  and Affect: Mood normal.        Behavior: Behavior normal.     Imaging: No results found.

## 2021-09-02 ENCOUNTER — Ambulatory Visit: Payer: 59 | Admitting: Orthopaedic Surgery

## 2021-09-09 ENCOUNTER — Ambulatory Visit: Payer: 59 | Admitting: Physical Medicine & Rehabilitation

## 2021-09-20 ENCOUNTER — Ambulatory Visit: Payer: Self-pay | Admitting: Nurse Practitioner

## 2021-09-24 ENCOUNTER — Other Ambulatory Visit: Payer: Self-pay | Admitting: Orthopaedic Surgery

## 2021-09-24 ENCOUNTER — Telehealth: Payer: Self-pay | Admitting: Orthopaedic Surgery

## 2021-09-24 MED ORDER — METHOCARBAMOL 750 MG PO TABS: ORAL_TABLET | ORAL | 1 refills | Status: DC

## 2021-09-24 NOTE — Telephone Encounter (Signed)
Patient called. She would like to know if Dr. Ninfa Linden could refill her Robaxin? Says she is trying to find a PCP. Her call back number is [phone]

## 2021-09-24 NOTE — Telephone Encounter (Signed)
Please see message below and advise . Thanks

## 2021-10-04 ENCOUNTER — Telehealth: Payer: Self-pay | Admitting: "Endocrinology

## 2021-10-04 NOTE — Telephone Encounter (Signed)
Patient called to be scheduled as a new patient. Patient has a [employer] plan that we do not accept. She said she called her [place] office and checked into the [place]. We do not accept this either. Patient said they advised her we are in network. I have sent our office manager a email to check into this.

## 2021-10-15 ENCOUNTER — Other Ambulatory Visit: Payer: Self-pay

## 2021-10-15 ENCOUNTER — Ambulatory Visit: Payer: 59 | Admitting: "Endocrinology

## 2021-10-15 ENCOUNTER — Encounter: Payer: Self-pay | Admitting: "Endocrinology

## 2021-10-15 VITALS — BP 136/86 | HR 76 | Ht 61.0 in | Wt 203.6 lb

## 2021-10-15 DIAGNOSIS — R7989 Other specified abnormal findings of blood chemistry: Secondary | ICD-10-CM | POA: Insufficient documentation
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
## 2021-10-18 ENCOUNTER — Telehealth: Payer: Self-pay

## 2021-10-18 NOTE — Telephone Encounter (Signed)
Patient called to inquire about appointment tomorrow. Left detailed message informing patient that she has a procedure appointment tomorrow 10/19/21 at 2:15 pm.

## 2021-10-19 ENCOUNTER — Encounter: Payer: Self-pay | Admitting: Physical Medicine & Rehabilitation

## 2021-10-19 ENCOUNTER — Other Ambulatory Visit: Payer: Self-pay

## 2021-10-19 ENCOUNTER — Encounter: Payer: 59 | Attending: Physical Medicine & Rehabilitation | Admitting: Physical Medicine & Rehabilitation

## 2021-10-19 DIAGNOSIS — M1711 Unilateral primary osteoarthritis, right knee: Secondary | ICD-10-CM | POA: Insufficient documentation

## 2021-10-19 DIAGNOSIS — M533 Sacrococcygeal disorders, not elsewhere classified: Secondary | ICD-10-CM | POA: Diagnosis present

## 2021-10-19 LAB — THYROID PEROXIDASE ANTIBODY: Thyroperoxidase Ab SerPl-aCnc: <9 IU/mL (ref 0–34)

## 2021-10-19 LAB — T3, FREE: T3, Free: 2.4 pg/mL (ref 2.0–4.4)

## 2021-10-19 LAB — T4, FREE: Free T4: 0.93 ng/dL (ref 0.82–1.77)

## 2021-10-19 LAB — TSH: TSH: 0.764 u[IU]/mL (ref 0.450–4.500)

## 2021-10-19 LAB — THYROGLOBULIN ANTIBODY: Thyroglobulin Antibody: <1 IU/mL (ref 0.0–0.9)

## 2021-10-19 MED ORDER — ACETAMINOPHEN-CODEINE #4 300-60 MG PO TABS: 1.0000 | ORAL_TABLET | Freq: Three times a day (TID) | ORAL | 1 refills | Status: DC | PRN

## 2021-10-19 NOTE — Progress Notes (Signed)
Right genicular nerve radiofrequency neurotomy under fluoroscopic guidance  Indication Chronic severe  knee pain that has not responded to PT, medication, and other conservative care.THere has been 50% relief of usual knee pain with genicular nerve blocks under fluoroscopic guidance  Informed consent was obtained after discussing risks and benefits of procedure with the patient.  These include bleeding, bruising and infection as well as foot numbness. Pt place in supine position on the fluoro table .  Static images identified distal femur and proximal tibia.  Lateral and medial supracondylar , as well as medial tibial flare prepped with betadine.  Marked under fluoro and then a 25 g 1.5 inch needle was used to anesthetize skin and subcutaneous tissue. 1.5 cc was infiltrate into each of 3 sites. Then a 18-gauge 10cm RF needle with a 59mm curvedactive  tip  was inserted under fluoroscopic guidance first targeting the medial tibial flare midpoint. After bone contact was made lateral images confirmed proper positioning at midpoint of shart ,then Motor stim at 2 Hz demonstrated no motor twitch followed by injection of 38ml of 2% lidocaine and radiofrequency lesioning 80 deg for 90 sec. . This same procedure was treated for the lateral and medial supracondylar areas using same equipment and technique. Patient tolerated procedure well. Post procedure instructions given.  Patient with bilateral sacroiliac pain had improvement after right SI was injected intra-articular by Dr. Ernestina Patches. Left SI is still painful despite conservative care including medication management and back exercises. We will do left SI block next month to evaluate for possible left SI RF.

## 2021-10-19 NOTE — Progress Notes (Signed)
°  PROCEDURE RECORD [facility]   Name: Kaylee Salas DOB:[birth year] MRN: [id number]  Date:10/19/2021  Physician: Alysia Penna, MD    Nurse/CMA: Truman Hayward, CMA  Allergies: No Known Allergies  Consent Signed: Yes.    Is patient diabetic? No.  CBG today? .  Pregnant: No. LMP: No LMP recorded. Patient is postmenopausal. (age 56-55)  Anticoagulants: no Anti-inflammatory: yes (Meloxicam) Antibiotics: no  Procedure: Right Genicular Radiofrequency  Position: Supine Start Time: 2:45 pm  End Time: 3;12 pm  Fluoro Time: 1;04  RN/CMA Truman Hayward, CMA [name], CMA    Time 2:30 pm 3:19 pm    BP 114/80 135/81    Pulse 90 82    Respirations 16 16    O2 Sat 95 98    S/S 6 6    Pain Level 8/10 7/10     D/C home with no one, [name] & O X 3, D/C instructions reviewed, and sits independently.

## 2021-10-19 NOTE — Patient Instructions (Signed)
You had a radio frequency procedure today This was done to alleviate joint pain in your right knee area We injected lidocaine which is a local anesthetic.  You may experience soreness at the injection sites. You may also experienced some irritation of the nerves that were heated I'm recommending ice for 30 minutes every 2 hours as needed for the next 24-48 hours  Next month we plan to do the left sacroiliac blocks

## 2021-10-25 ENCOUNTER — Other Ambulatory Visit: Payer: Self-pay | Admitting: Orthopaedic Surgery

## 2021-10-25 ENCOUNTER — Telehealth: Payer: Self-pay

## 2021-10-25 NOTE — Telephone Encounter (Signed)
Patient is calling to ask if she can change her procedure on 12/07/21 from the left side to the right side. She wants to get the right side down while there is still some numbing on that side. Please advise

## 2021-10-26 ENCOUNTER — Other Ambulatory Visit: Payer: Self-pay

## 2021-10-26 ENCOUNTER — Ambulatory Visit (HOSPITAL_COMMUNITY)
Admission: RE | Admit: 2021-10-26 | Discharge: 2021-10-26 | Disposition: A | Discharge status: Home / Self Care | Payer: 59 | Source: Ambulatory Visit | Attending: "Endocrinology | Admitting: "Endocrinology

## 2021-10-26 DIAGNOSIS — R7989 Other specified abnormal findings of blood chemistry: Secondary | ICD-10-CM | POA: Insufficient documentation

## 2021-10-28 ENCOUNTER — Encounter: Payer: Self-pay | Admitting: "Endocrinology

## 2021-10-28 ENCOUNTER — Ambulatory Visit (INDEPENDENT_AMBULATORY_CARE_PROVIDER_SITE_OTHER): Payer: 59 | Admitting: "Endocrinology

## 2021-10-28 VITALS — BP 126/84 | HR 94 | Ht 61.0 in | Wt 195.8 lb

## 2021-10-28 DIAGNOSIS — R7989 Other specified abnormal findings of blood chemistry: Secondary | ICD-10-CM
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
## 2021-11-18 ENCOUNTER — Telehealth: Payer: Self-pay | Admitting: Orthopaedic Surgery
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
## 2021-11-19 NOTE — Telephone Encounter (Signed)
I called patient and advised. She already has follow up appt scheduled. ?

## 2021-12-01 ENCOUNTER — Ambulatory Visit (INDEPENDENT_AMBULATORY_CARE_PROVIDER_SITE_OTHER): Payer: 59

## 2021-12-01 ENCOUNTER — Encounter: Payer: Self-pay | Admitting: Orthopaedic Surgery

## 2021-12-01 ENCOUNTER — Ambulatory Visit: Payer: 59 | Admitting: Orthopaedic Surgery

## 2021-12-01 VITALS — Ht 61.0 in | Wt 201.0 lb

## 2021-12-01 DIAGNOSIS — M1611 Unilateral primary osteoarthritis, right hip: Secondary | ICD-10-CM | POA: Diagnosis not present

## 2021-12-01 DIAGNOSIS — G8929 Other chronic pain: Secondary | ICD-10-CM | POA: Diagnosis not present

## 2021-12-01 DIAGNOSIS — M25551 Pain in right hip: Secondary | ICD-10-CM

## 2021-12-01 DIAGNOSIS — M25561 Pain in right knee: Secondary | ICD-10-CM | POA: Diagnosis not present

## 2021-12-01 DIAGNOSIS — M1711 Unilateral primary osteoarthritis, right knee: Secondary | ICD-10-CM | POA: Diagnosis not present
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
## 2021-12-07 ENCOUNTER — Encounter: Payer: 59 | Attending: Physical Medicine & Rehabilitation | Admitting: Physical Medicine & Rehabilitation

## 2021-12-07 ENCOUNTER — Encounter: Payer: Self-pay | Admitting: Physical Medicine & Rehabilitation

## 2021-12-07 VITALS — BP 122/82 | HR 100 | Temp 97.7°F | Ht 61.0 in | Wt 198.0 lb

## 2021-12-07 DIAGNOSIS — M533 Sacrococcygeal disorders, not elsewhere classified: Secondary | ICD-10-CM | POA: Insufficient documentation

## 2021-12-07 MED ORDER — ACETAMINOPHEN-CODEINE #4 300-60 MG PO TABS: 1.0000 | ORAL_TABLET | Freq: Three times a day (TID) | ORAL | 1 refills | Status: DC | PRN

## 2021-12-07 NOTE — Progress Notes (Addendum)
L5 dorsal ramus S1-S2-S3 lateral branch blocks under fluoroscopic guidance ?Left side ? ?Informed consent was obtained after describing risks and benefits of the procedure with patient these include bleeding bruising and infection.  He elects to proceed and has given written consent.  Patient placed prone on fluoroscopy table Betadine prep sterile drape a 25-gauge 1.5 inch needle was used to anesthetize skin and subcu tissue with 1% lidocaine 1 cc into each of 4 sites.  Then a 22-gauge 3.5" needle was inserted under fluoroscopic guidance for starting the S1 SAP sacral ala junction.  Bone contact made.  Isovue 200 x 0.5 mL demonstrated no intravascular uptake then 0.5 mL of 2% lidocaine was injected.  Then the lateral aspect of the S1, S2, S3 foramen was targeted.  Bone contact made out, Isovue-200 times 0.5 mL demonstrated no nerve root or intravascular uptake within 0.5 mL of 2% lidocaine solution was injected after negative drawback for blood.  Patient tolerated procedure well.  Postinjection instructions given.  ?Preinjection pain level 10/10 ?Postinjection pain level 5/10 ?

## 2021-12-07 NOTE — Progress Notes (Signed)
?  PROCEDURE RECORD ?[facility] ? ? ?Name: Kaylee Salas ?DOB:[birth year] ?MRN: [id number] ? ?Date:12/07/2021  Physician: Alysia Penna, MD   ? ?Nurse/CMA: Jorja Loa MA ? ?Allergies: No Known Allergies ? ?Consent Signed: Yes.    Is patient diabetic? No.  CBG today? N/A ? ?Pregnant: No. LMP: No LMP recorded. Patient is postmenopausal. (age 56-55) ? ?Anticoagulants: no ?Anti-inflammatory: no ?Antibiotics: no ? ?Procedure: Right Sacroiliac Steroid Injection  Position: Prone ?Start Time: 2:36 pm  End Time: 2:48 PM  Fluoro Time: 32 ? ?RN/CMA Gwyndolyn Saxon MA    ?Time 2:15 PM 2:51 PM    ?BP 122/82 132/83    ?Pulse 100 97    ?Respirations 16 16    ?O2 Sat 95 96    ?S/S 6 6    ?Pain Level 10/10 5/10    ? ?D/C home with Self, [name] & O X 3, D/C instructions reviewed, and sits independently. ? ? ? ? ? ? ? ?

## 2021-12-07 NOTE — Patient Instructions (Signed)
Sacroiliac nerve block injections performed today. ? ?The injection was done under x-ray guidance with contrast enhancement. ?This procedure has been performed to help reduce low back and buttocks pain as well as potentially hip pain. ?The duration of this injection is variable lasting from hours to  weeks ?If this injection produces a >50%  short term relief of pain, then a radiofrequency ablation of these same nerves may result in a 6mo + pain relief  ?

## 2021-12-15 ENCOUNTER — Telehealth: Payer: Self-pay | Admitting: Orthopaedic Surgery

## 2021-12-15 NOTE — Telephone Encounter (Signed)
Patient called asked if she can get her surgery date. Patient said she was driving and could not write it down.  Patient said if she can not answer her phone to please leave a message. ? ?The number to contact patient is [phone] ?

## 2021-12-17 NOTE — Telephone Encounter (Signed)
I called patient and confirmed surgery date. ?

## 2021-12-24 ENCOUNTER — Other Ambulatory Visit: Payer: Self-pay

## 2022-01-04 ENCOUNTER — Telehealth: Payer: Self-pay

## 2022-01-04 MED ORDER — ACETAMINOPHEN-CODEINE #4 300-60 MG PO TABS: 1.0000 | ORAL_TABLET | Freq: Four times a day (QID) | ORAL | 1 refills | Status: DC | PRN

## 2022-01-04 NOTE — Telephone Encounter (Signed)
Kaylee Salas is requesting a stronger pain medication. The Tylenol #3 are not helping her knee & hip pain.  ?Call back phone [phone]. ?

## 2022-01-04 NOTE — Addendum Note (Signed)
Addended by: Charlett Blake on: 01/04/2022 04:40 PM ? ? Modules accepted: Orders ? ?

## 2022-01-05 NOTE — Telephone Encounter (Signed)
Patient is aware that she is to take Tylenol #4 one every 6 hours.  ?

## 2022-01-06 ENCOUNTER — Telehealth: Payer: Self-pay | Admitting: Orthopaedic Surgery

## 2022-01-06 ENCOUNTER — Other Ambulatory Visit: Payer: Self-pay | Admitting: Orthopaedic Surgery

## 2022-01-06 MED ORDER — METHOCARBAMOL 750 MG PO TABS: ORAL_TABLET | ORAL | 1 refills | Status: DC

## 2022-01-06 NOTE — Telephone Encounter (Signed)
Please call in methocarbamol for the pt  ?

## 2022-01-06 NOTE — Telephone Encounter (Signed)
Pt has not heard anything from the hosp  ?

## 2022-01-20 ENCOUNTER — Encounter: Payer: 59 | Admitting: Physical Medicine & Rehabilitation

## 2022-01-26 ENCOUNTER — Other Ambulatory Visit: Payer: Self-pay | Admitting: Physician Assistant

## 2022-01-26 DIAGNOSIS — M1711 Unilateral primary osteoarthritis, right knee: Secondary | ICD-10-CM

## 2022-01-28 NOTE — Progress Notes (Addendum)
Surgical Instructions    Your procedure is scheduled on February 08, 2022.  Report to [facility] Entrance "A" at 12:00 Noon, then check in with the Admitting office.  Call this number if you have problems the morning of surgery:  [phone]   If you have any questions prior to your surgery date call [phone]: Open Monday-Friday 8am-4pm    Remember:  Do not eat after midnight the night before your surgery  You may drink clear liquids until 11:00 am the morning of your surgery.   Clear liquids allowed are: Water, Non-Citrus Juices (without pulp), Carbonated Beverages, Clear Tea, Black Coffee ONLY (NO MILK, CREAM OR POWDERED CREAMER of any kind), and Gatorade   Enhanced Recovery after Surgery for Orthopedics Enhanced Recovery after Surgery is a protocol used to improve the stress on your body and your recovery after surgery.  Patient Instructions  The day of surgery (if you do NOT have diabetes):  Drink ONE (1) Pre-Surgery Clear Ensure by 11:00 am the morning of surgery   This drink was given to you during your hospital  pre-op appointment visit. Nothing else to drink after completing the  Pre-Surgery Clear Ensure.         If you have questions, please contact your surgeon's office.     Take these medicines the morning of surgery with A SIP OF WATER:  Duloxetine (Cymbalta) Sertraline (Zoloft)  If needed: Acetaminophen-Codeine (Tylenol #4) Clonazepam (Klonopin) Ondansetron (Zofran)   As of today, STOP taking any Aspirin (unless otherwise instructed by your surgeon) Aleve, Naproxen, Ibuprofen, Motrin, Advil, Goody's Headache powder, BC's, all herbal medications, fish oil, and all vitamins. This includes Meloxicam (MOBIC).           Do not wear jewelry or makeup Do not wear lotions, powders, perfumes, or deodorant. Do not shave 48 hours prior to surgery.   Do not bring valuables to the hospital. Do not wear nail polish, gel polish, artificial nails, or any other  type of covering on natural nails (fingers and toes) If you have artificial nails or gel coating that need to be removed by a nail salon, please have this removed prior to surgery. Artificial nails or gel coating may interfere with anesthesia's ability to adequately monitor your vital signs.  [facility] is not responsible for any belongings or valuables. .   Do NOT Smoke (Tobacco/Vaping)  24 hours prior to your procedure  If you use a CPAP at night, you may bring your mask for your overnight stay.   Contacts, glasses, hearing aids, dentures or partials may not be worn into surgery, please bring cases for these belongings   For patients admitted to the hospital, discharge time will be determined by your treatment team.   Patients discharged the day of surgery will not be allowed to drive home, and someone needs to stay with them for 24 hours.   SURGICAL WAITING ROOM VISITATION Patients having surgery or a procedure in a hospital may have two support people. Children under the age of 65 must have an adult with them who is not the patient. They may stay in the waiting area during the procedure and may switch out with other visitors. If the patient needs to stay at the hospital during part of their recovery, the visitor guidelines for inpatient rooms apply.  Please refer to the [facility] website for the visitor guidelines for Inpatients (after your surgery is over and you are in a regular room).  Special instructions:    Oral Hygiene is also important to reduce your risk of infection.  Remember - BRUSH YOUR TEETH THE MORNING OF SURGERY WITH YOUR REGULAR TOOTHPASTE   Kaylee Salas- Preparing For Surgery  Before surgery, you can play an important role. Because skin is not sterile, your skin needs to be as free of germs as possible. You can reduce the number of germs on your skin by washing with CHG (chlorahexidine gluconate) Soap before surgery.  CHG is an antiseptic cleaner which  kills germs and bonds with the skin to continue killing germs even after washing.     Please do not use if you have an allergy to CHG or antibacterial soaps. If your skin becomes reddened/irritated stop using the CHG.  Do not shave (including legs and underarms) for at least 48 hours prior to first CHG shower. It is OK to shave your face.  Please follow these instructions carefully.     Shower the NIGHT BEFORE SURGERY and the MORNING OF SURGERY with CHG Soap.   If you chose to wash your hair, wash your hair first as usual with your normal shampoo. After you shampoo, rinse your hair and body thoroughly to remove the shampoo.  Then [employer] and genitals (private parts) with your normal soap and rinse thoroughly to remove soap.  After that Use CHG Soap as you would any other liquid soap. You can apply CHG directly to the skin and wash gently with a scrungie or a clean washcloth.   Apply the CHG Soap to your body ONLY FROM THE NECK DOWN.  Do not use on open wounds or open sores. Avoid contact with your eyes, ears, mouth and genitals (private parts). Wash Face and genitals (private parts)  with your normal soap.   Wash thoroughly, paying special attention to the area where your surgery will be performed.  Thoroughly rinse your body with warm water from the neck down.  DO NOT shower/wash with your normal soap after using and rinsing off the CHG Soap.  Pat yourself dry with a CLEAN TOWEL.  Wear CLEAN PAJAMAS to bed the night before surgery  Place CLEAN SHEETS on your bed the night before your surgery  DO NOT SLEEP WITH PETS.   Day of Surgery:  Take a shower with CHG soap. Wear Clean/Comfortable clothing the morning of surgery Do not apply any deodorants/lotions.   Remember to brush your teeth WITH YOUR REGULAR TOOTHPASTE.    If you received a COVID test during your pre-op visit, it is requested that you wear a mask when out in public, stay away from anyone that may not be feeling  well, and notify your surgeon if you develop symptoms. If you have been in contact with anyone that has tested positive in the last 10 days, please notify your surgeon.    Please read over the following fact sheets that you were given.

## 2022-01-31 ENCOUNTER — Encounter (HOSPITAL_COMMUNITY): Payer: Self-pay

## 2022-01-31 ENCOUNTER — Telehealth: Payer: Self-pay | Admitting: Orthopaedic Surgery

## 2022-01-31 ENCOUNTER — Other Ambulatory Visit: Payer: Self-pay

## 2022-01-31 ENCOUNTER — Encounter (HOSPITAL_COMMUNITY)
Admission: RE | Admit: 2022-01-31 | Discharge: 2022-01-31 | Disposition: A | Discharge status: Home / Self Care | Payer: 59 | Source: Ambulatory Visit | Attending: Orthopaedic Surgery | Admitting: Orthopaedic Surgery

## 2022-01-31 VITALS — BP 163/97 | HR 90 | Temp 97.7°F | Resp 17 | Ht 62.0 in | Wt 195.0 lb

## 2022-01-31 DIAGNOSIS — M1711 Unilateral primary osteoarthritis, right knee: Secondary | ICD-10-CM | POA: Insufficient documentation

## 2022-01-31 DIAGNOSIS — Z01812 Encounter for preprocedural laboratory examination: Secondary | ICD-10-CM | POA: Insufficient documentation

## 2022-01-31 DIAGNOSIS — Z01818 Encounter for other preprocedural examination: Secondary | ICD-10-CM

## 2022-01-31 HISTORY — DX: Other specified postprocedural states: Z98.890

## 2022-01-31 HISTORY — DX: Anemia, unspecified: D64.9

## 2022-01-31 HISTORY — DX: Other specified postprocedural states: R11.2

## 2022-01-31 LAB — CBC
HCT: 38.8 % (ref 36.0–46.0)
Hemoglobin: 12 g/dL (ref 12.0–15.0)
MCH: 26.3 pg (ref 26.0–34.0)
MCHC: 30.9 g/dL (ref 30.0–36.0)
MCV: 85.1 fL (ref 80.0–100.0)
Platelets: 309 10*3/uL (ref 150–400)
RBC: 4.56 MIL/uL (ref 3.87–5.11)
RDW: 14.4 % (ref 11.5–15.5)
WBC: 8.4 10*3/uL (ref 4.0–10.5)
nRBC: 0 % (ref 0.0–0.2)

## 2022-01-31 LAB — TYPE AND SCREEN
ABO/RH(D): A NEG
Antibody Screen: NEGATIVE

## 2022-01-31 LAB — SURGICAL PCR SCREEN
MRSA, PCR: POSITIVE — AB
Staphylococcus aureus: POSITIVE — AB

## 2022-01-31 NOTE — Progress Notes (Signed)
PCP - Emelda Fear Cardiologist - denies  Chest x-ray - n/a EKG - 09/14/21  ERAS Protcol - yes, Ensure given   Anesthesia review: n/a  Patient denies shortness of breath, fever, cough and chest pain at PAT appointment   All instructions explained to the patient, with a verbal understanding of the material. Patient agrees to go over the instructions while at home for a better understanding. Patient also instructed to self quarantine after being tested for COVID-19. The opportunity to ask questions was provided.

## 2022-01-31 NOTE — Telephone Encounter (Signed)
Please advise 

## 2022-01-31 NOTE — Telephone Encounter (Signed)
Patient is having chaffing in between thighs where panty lining is (from change of washing detergent) and would like to know if she needs an antibiotic or a cream she can put on there she would like the area to be as clean as possible before knee replacement surgery on 06/13 or does she not need to worry about it at all. She is Using Destine (diaper rash ointment) on the area for chaffing. Patient says you can contact her Via MyChart if needed.

## 2022-01-31 NOTE — Telephone Encounter (Signed)
Called and advised pt. She stated understanding  

## 2022-02-01 NOTE — Progress Notes (Addendum)
Notified Dr. Ninfa Linden of patient's MRSA PCR result.  Waiting for response.    Update: Received verbal order for vancomycin DOS.

## 2022-02-04 ENCOUNTER — Telehealth: Payer: Self-pay | Admitting: Orthopaedic Surgery

## 2022-02-04 NOTE — Telephone Encounter (Signed)
Patient called advised she tested possible for Staph and MRSA. Patient said she wanted to make Dr. Ninfa Linden aware of this. The number to contact patient is [phone]

## 2022-02-08 ENCOUNTER — Observation Stay (HOSPITAL_COMMUNITY): Payer: 59

## 2022-02-08 ENCOUNTER — Ambulatory Visit (HOSPITAL_COMMUNITY): Payer: 59 | Admitting: Anesthesiology

## 2022-02-08 ENCOUNTER — Other Ambulatory Visit: Payer: Self-pay

## 2022-02-08 ENCOUNTER — Encounter (HOSPITAL_COMMUNITY)
Admission: RE | Disposition: A | Discharge status: Home Health | Payer: Self-pay | Source: Home / Self Care | Attending: Orthopaedic Surgery

## 2022-02-08 ENCOUNTER — Encounter (HOSPITAL_COMMUNITY): Payer: Self-pay | Admitting: Orthopaedic Surgery

## 2022-02-08 ENCOUNTER — Inpatient Hospital Stay (HOSPITAL_COMMUNITY)
Admission: RE | Admit: 2022-02-08 | Discharge: 2022-02-11 | DRG: 470 | Disposition: A | Discharge status: Home Health | Payer: 59 | Attending: Orthopaedic Surgery | Admitting: Orthopaedic Surgery

## 2022-02-08 DIAGNOSIS — Z83438 Family history of other disorder of lipoprotein metabolism and other lipidemia: Secondary | ICD-10-CM

## 2022-02-08 DIAGNOSIS — M810 Age-related osteoporosis without current pathological fracture: Secondary | ICD-10-CM | POA: Diagnosis present

## 2022-02-08 DIAGNOSIS — Z9884 Bariatric surgery status: Secondary | ICD-10-CM

## 2022-02-08 DIAGNOSIS — Z96642 Presence of left artificial hip joint: Secondary | ICD-10-CM | POA: Diagnosis present

## 2022-02-08 DIAGNOSIS — E669 Obesity, unspecified: Secondary | ICD-10-CM | POA: Diagnosis present

## 2022-02-08 DIAGNOSIS — M1711 Unilateral primary osteoarthritis, right knee: Secondary | ICD-10-CM

## 2022-02-08 DIAGNOSIS — M179 Osteoarthritis of knee, unspecified: Secondary | ICD-10-CM | POA: Diagnosis present

## 2022-02-08 DIAGNOSIS — Z6835 Body mass index (BMI) 35.0-35.9, adult: Secondary | ICD-10-CM

## 2022-02-08 DIAGNOSIS — Z96651 Presence of right artificial knee joint: Secondary | ICD-10-CM

## 2022-02-08 DIAGNOSIS — M1712 Unilateral primary osteoarthritis, left knee: Secondary | ICD-10-CM | POA: Diagnosis present

## 2022-02-08 DIAGNOSIS — Z8249 Family history of ischemic heart disease and other diseases of the circulatory system: Secondary | ICD-10-CM

## 2022-02-08 DIAGNOSIS — M1611 Unilateral primary osteoarthritis, right hip: Secondary | ICD-10-CM | POA: Diagnosis present

## 2022-02-08 HISTORY — PX: TOTAL KNEE ARTHROPLASTY: SHX125

## 2022-02-08 LAB — ABO/RH: ABO/RH(D): A NEG

## 2022-02-08 SURGERY — ARTHROPLASTY, KNEE, TOTAL
Anesthesia: Regional | Site: Knee | Laterality: Right

## 2022-02-08 MED ORDER — ROPIVACAINE HCL 7.5 MG/ML IJ SOLN
INTRAMUSCULAR | Status: DC | PRN
  Administered 2022-02-08: 20 mL via PERINEURAL

## 2022-02-08 MED ORDER — ORAL CARE MOUTH RINSE: 15.0000 mL | Freq: Once | OROMUCOSAL | Status: AC

## 2022-02-08 MED ORDER — MIDAZOLAM HCL 2 MG/2ML IJ SOLN: 4.0000 mg | Freq: Once | INTRAMUSCULAR | Status: AC

## 2022-02-08 MED ORDER — DULOXETINE HCL 20 MG PO CPEP
20.0000 mg | ORAL_CAPSULE | Freq: Every day | ORAL | Status: DC
  Administered 2022-02-09 – 2022-02-11 (×3): 20 mg via ORAL
  Filled 2022-02-08 (×3): qty 1

## 2022-02-08 MED ORDER — FENTANYL CITRATE (PF) 100 MCG/2ML IJ SOLN
INTRAMUSCULAR | Status: AC
  Filled 2022-02-08: qty 2

## 2022-02-08 MED ORDER — ONDANSETRON HCL 4 MG PO TABS: 4.0000 mg | ORAL_TABLET | Freq: Four times a day (QID) | ORAL | Status: DC | PRN

## 2022-02-08 MED ORDER — VANCOMYCIN HCL IN DEXTROSE 1-5 GM/200ML-% IV SOLN
1000.0000 mg | Freq: Two times a day (BID) | INTRAVENOUS | Status: AC
  Administered 2022-02-09: 1000 mg via INTRAVENOUS
  Filled 2022-02-08: qty 200

## 2022-02-08 MED ORDER — BISACODYL 5 MG PO TBEC: 5.0000 mg | DELAYED_RELEASE_TABLET | Freq: Every day | ORAL | Status: DC | PRN

## 2022-02-08 MED ORDER — HYDROXYZINE HCL 25 MG PO TABS
25.0000 mg | ORAL_TABLET | Freq: Every evening | ORAL | Status: DC
  Administered 2022-02-08 – 2022-02-10 (×3): 25 mg via ORAL
  Filled 2022-02-08 (×3): qty 1

## 2022-02-08 MED ORDER — OXYCODONE HCL 5 MG PO TABS
ORAL_TABLET | ORAL | Status: AC
  Filled 2022-02-08: qty 1

## 2022-02-08 MED ORDER — PHENOL 1.4 % MT LIQD: 1.0000 | OROMUCOSAL | Status: DC | PRN

## 2022-02-08 MED ORDER — PANTOPRAZOLE SODIUM 40 MG PO TBEC
40.0000 mg | DELAYED_RELEASE_TABLET | Freq: Every day | ORAL | Status: DC
  Administered 2022-02-09 – 2022-02-11 (×3): 40 mg via ORAL
  Filled 2022-02-08 (×3): qty 1

## 2022-02-08 MED ORDER — METOCLOPRAMIDE HCL 5 MG/ML IJ SOLN: 5.0000 mg | Freq: Three times a day (TID) | INTRAMUSCULAR | Status: DC | PRN

## 2022-02-08 MED ORDER — CLONAZEPAM 0.5 MG PO TABS
2.0000 mg | ORAL_TABLET | Freq: Two times a day (BID) | ORAL | Status: DC | PRN
  Administered 2022-02-09 – 2022-02-10 (×2): 2 mg via ORAL
  Filled 2022-02-08 (×2): qty 4

## 2022-02-08 MED ORDER — PHENYLEPHRINE 80 MCG/ML (10ML) SYRINGE FOR IV PUSH (FOR BLOOD PRESSURE SUPPORT)
PREFILLED_SYRINGE | INTRAVENOUS | Status: AC
Start: 2022-02-08 — End: ?
  Filled 2022-02-08: qty 10

## 2022-02-08 MED ORDER — LIDOCAINE 2% (20 MG/ML) 5 ML SYRINGE
INTRAMUSCULAR | Status: AC
  Filled 2022-02-08: qty 5

## 2022-02-08 MED ORDER — FENTANYL CITRATE (PF) 100 MCG/2ML IJ SOLN
INTRAMUSCULAR | Status: AC
  Administered 2022-02-08: 200 ug via INTRAVENOUS
  Filled 2022-02-08: qty 2

## 2022-02-08 MED ORDER — LACTATED RINGERS IV SOLN: INTRAVENOUS | Status: DC

## 2022-02-08 MED ORDER — POLYETHYLENE GLYCOL 3350 17 G PO PACK
17.0000 g | PACK | Freq: Every day | ORAL | Status: DC | PRN
Start: 2022-02-08 — End: 2022-02-11

## 2022-02-08 MED ORDER — SUCRALFATE 1 G PO TABS
1.0000 g | ORAL_TABLET | Freq: Three times a day (TID) | ORAL | Status: DC
  Administered 2022-02-09 – 2022-02-11 (×10): 1 g via ORAL
  Filled 2022-02-08 (×6): qty 1

## 2022-02-08 MED ORDER — ROPINIROLE HCL 0.5 MG PO TABS
1.0000 mg | ORAL_TABLET | Freq: Four times a day (QID) | ORAL | Status: DC | PRN
  Administered 2022-02-08 – 2022-02-11 (×2): 1 mg via ORAL
  Filled 2022-02-08 (×2): qty 2

## 2022-02-08 MED ORDER — 0.9 % SODIUM CHLORIDE (POUR BTL) OPTIME
TOPICAL | Status: DC | PRN
  Administered 2022-02-08: 1000 mL

## 2022-02-08 MED ORDER — POVIDONE-IODINE 10 % EX SWAB
2.0000 "application " | Freq: Once | CUTANEOUS | Status: AC
  Administered 2022-02-08: 2 via TOPICAL

## 2022-02-08 MED ORDER — DEXAMETHASONE SODIUM PHOSPHATE 10 MG/ML IJ SOLN
INTRAMUSCULAR | Status: DC | PRN
  Administered 2022-02-08: 5 mg

## 2022-02-08 MED ORDER — FENTANYL CITRATE (PF) 100 MCG/2ML IJ SOLN
25.0000 ug | INTRAMUSCULAR | Status: DC | PRN
  Administered 2022-02-08 (×3): 50 ug via INTRAVENOUS

## 2022-02-08 MED ORDER — ASPIRIN 81 MG PO CHEW
81.0000 mg | CHEWABLE_TABLET | Freq: Two times a day (BID) | ORAL | Status: DC
  Administered 2022-02-08 – 2022-02-11 (×6): 81 mg via ORAL
  Filled 2022-02-08 (×6): qty 1

## 2022-02-08 MED ORDER — ONDANSETRON HCL 4 MG/2ML IJ SOLN
INTRAMUSCULAR | Status: AC
  Filled 2022-02-08: qty 2

## 2022-02-08 MED ORDER — PHENYLEPHRINE HCL-NACL 20-0.9 MG/250ML-% IV SOLN
INTRAVENOUS | Status: DC | PRN
  Administered 2022-02-08: 30 ug/min via INTRAVENOUS

## 2022-02-08 MED ORDER — TIZANIDINE HCL 4 MG PO TABS
4.0000 mg | ORAL_TABLET | Freq: Four times a day (QID) | ORAL | Status: DC | PRN
Start: 2022-02-08 — End: 2022-02-11
  Administered 2022-02-09 – 2022-02-11 (×2): 4 mg via ORAL
  Filled 2022-02-08 (×3): qty 1

## 2022-02-08 MED ORDER — MIDAZOLAM HCL 2 MG/2ML IJ SOLN
INTRAMUSCULAR | Status: AC
  Filled 2022-02-08: qty 2

## 2022-02-08 MED ORDER — HYDROMORPHONE HCL 1 MG/ML IJ SOLN
INTRAMUSCULAR | Status: AC
  Filled 2022-02-08: qty 1

## 2022-02-08 MED ORDER — MIDAZOLAM HCL 2 MG/2ML IJ SOLN
INTRAMUSCULAR | Status: AC
  Administered 2022-02-08: 4 mg via INTRAVENOUS
  Filled 2022-02-08: qty 2

## 2022-02-08 MED ORDER — METOCLOPRAMIDE HCL 5 MG PO TABS: 5.0000 mg | ORAL_TABLET | Freq: Three times a day (TID) | ORAL | Status: DC | PRN

## 2022-02-08 MED ORDER — VANCOMYCIN HCL IN DEXTROSE 1-5 GM/200ML-% IV SOLN
1000.0000 mg | Freq: Once | INTRAVENOUS | Status: AC
  Administered 2022-02-08: 1000 mg via INTRAVENOUS
  Filled 2022-02-08: qty 200

## 2022-02-08 MED ORDER — PROPOFOL 500 MG/50ML IV EMUL
INTRAVENOUS | Status: DC | PRN
  Administered 2022-02-08: 90 ug/kg/min via INTRAVENOUS

## 2022-02-08 MED ORDER — LIDOCAINE 2% (20 MG/ML) 5 ML SYRINGE
INTRAMUSCULAR | Status: DC | PRN
  Administered 2022-02-08: 40 mg via INTRAVENOUS

## 2022-02-08 MED ORDER — ACETAMINOPHEN 325 MG PO TABS
325.0000 mg | ORAL_TABLET | Freq: Four times a day (QID) | ORAL | Status: DC | PRN
  Administered 2022-02-08: 650 mg via ORAL

## 2022-02-08 MED ORDER — ACETAMINOPHEN 325 MG PO TABS
ORAL_TABLET | ORAL | Status: AC
  Filled 2022-02-08: qty 2

## 2022-02-08 MED ORDER — FENTANYL CITRATE (PF) 100 MCG/2ML IJ SOLN: 200.0000 ug | Freq: Once | INTRAMUSCULAR | Status: AC

## 2022-02-08 MED ORDER — SERTRALINE HCL 100 MG PO TABS
100.0000 mg | ORAL_TABLET | Freq: Every day | ORAL | Status: DC
  Administered 2022-02-09 – 2022-02-11 (×3): 100 mg via ORAL
  Filled 2022-02-08 (×3): qty 1

## 2022-02-08 MED ORDER — ACETAMINOPHEN 500 MG PO TABS
1000.0000 mg | ORAL_TABLET | Freq: Once | ORAL | Status: AC
Start: 2022-02-08 — End: 2022-02-08
  Administered 2022-02-08: 1000 mg via ORAL
  Filled 2022-02-08: qty 2

## 2022-02-08 MED ORDER — SODIUM CHLORIDE 0.9 % IR SOLN
Status: DC | PRN
  Administered 2022-02-08: 1000 mL

## 2022-02-08 MED ORDER — OXYCODONE HCL 5 MG PO TABS
5.0000 mg | ORAL_TABLET | ORAL | Status: DC | PRN
  Administered 2022-02-08: 5 mg via ORAL
  Administered 2022-02-09 – 2022-02-11 (×12): 10 mg via ORAL
  Filled 2022-02-08 (×12): qty 2

## 2022-02-08 MED ORDER — TRANEXAMIC ACID-NACL 1000-0.7 MG/100ML-% IV SOLN
1000.0000 mg | INTRAVENOUS | Status: AC
  Administered 2022-02-08: 1000 mg via INTRAVENOUS
  Filled 2022-02-08: qty 100

## 2022-02-08 MED ORDER — BUPIVACAINE IN DEXTROSE 0.75-8.25 % IT SOLN
INTRATHECAL | Status: DC | PRN
  Administered 2022-02-08: 1.8 mL via INTRATHECAL

## 2022-02-08 MED ORDER — SODIUM CHLORIDE 0.9 % IV SOLN: INTRAVENOUS | Status: DC

## 2022-02-08 MED ORDER — MENTHOL 3 MG MT LOZG: 1.0000 | LOZENGE | OROMUCOSAL | Status: DC | PRN

## 2022-02-08 MED ORDER — HYDROMORPHONE HCL 1 MG/ML IJ SOLN
0.5000 mg | INTRAMUSCULAR | Status: DC | PRN
  Administered 2022-02-08 – 2022-02-10 (×7): 1 mg via INTRAVENOUS
  Filled 2022-02-08 (×6): qty 1

## 2022-02-08 MED ORDER — DIPHENHYDRAMINE HCL 12.5 MG/5ML PO ELIX: 12.5000 mg | ORAL_SOLUTION | ORAL | Status: DC | PRN

## 2022-02-08 MED ORDER — DOCUSATE SODIUM 100 MG PO CAPS
100.0000 mg | ORAL_CAPSULE | Freq: Two times a day (BID) | ORAL | Status: DC
  Administered 2022-02-08 – 2022-02-11 (×6): 100 mg via ORAL
  Filled 2022-02-08 (×6): qty 1

## 2022-02-08 MED ORDER — HYDROMORPHONE HCL 2 MG PO TABS
2.0000 mg | ORAL_TABLET | ORAL | Status: DC | PRN
  Administered 2022-02-08 – 2022-02-09 (×3): 2 mg via ORAL
  Administered 2022-02-09 – 2022-02-10 (×3): 3 mg via ORAL
  Filled 2022-02-08 (×2): qty 2
  Filled 2022-02-08 (×3): qty 1
  Filled 2022-02-08: qty 2

## 2022-02-08 MED ORDER — ALUM & MAG HYDROXIDE-SIMETH 200-200-20 MG/5ML PO SUSP
30.0000 mL | ORAL | Status: DC | PRN
Start: 2022-02-08 — End: 2022-02-11

## 2022-02-08 MED ORDER — CEFAZOLIN SODIUM-DEXTROSE 2-4 GM/100ML-% IV SOLN
2.0000 g | INTRAVENOUS | Status: AC
  Administered 2022-02-08: 2 g via INTRAVENOUS
  Filled 2022-02-08: qty 100

## 2022-02-08 MED ORDER — CHLORHEXIDINE GLUCONATE 0.12 % MT SOLN
15.0000 mL | Freq: Once | OROMUCOSAL | Status: AC
  Administered 2022-02-08: 15 mL via OROMUCOSAL
  Filled 2022-02-08: qty 15

## 2022-02-08 MED ORDER — ONDANSETRON HCL 4 MG/2ML IJ SOLN
4.0000 mg | Freq: Four times a day (QID) | INTRAMUSCULAR | Status: DC | PRN
  Administered 2022-02-08 – 2022-02-10 (×2): 4 mg via INTRAVENOUS
  Filled 2022-02-08: qty 2

## 2022-02-08 MED ORDER — PROPOFOL 10 MG/ML IV BOLUS
INTRAVENOUS | Status: DC | PRN
  Administered 2022-02-08: 50 mg via INTRAVENOUS
  Administered 2022-02-08: 40 mg via INTRAVENOUS

## 2022-02-08 SURGICAL SUPPLY — 77 items
BAG COUNTER SPONGE SURGICOUNT (BAG) ×2 IMPLANT
BANDAGE ESMARK 6X9 LF (GAUZE/BANDAGES/DRESSINGS) ×1 IMPLANT
BLADE SAG 18X100X1.27 (BLADE) ×2 IMPLANT
BNDG ELASTIC 6X15 VLCR STRL LF (GAUZE/BANDAGES/DRESSINGS) ×1 IMPLANT
BNDG ELASTIC 6X5.8 VLCR STR LF (GAUZE/BANDAGES/DRESSINGS) ×4 IMPLANT
BNDG ESMARK 6X9 LF (GAUZE/BANDAGES/DRESSINGS) ×2
BOWL SMART MIX CTS (DISPOSABLE) ×2 IMPLANT
BSPLAT TIB 5D D CMNT STM RT (Knees) ×1 IMPLANT
CEMENT BONE R 1X40 (Cement) ×2 IMPLANT
COMP PATELLAR 29 STD 8 THK (Orthopedic Implant) IMPLANT
COVER SURGICAL LIGHT HANDLE (MISCELLANEOUS) ×2 IMPLANT
CUFF TOURN SGL QUICK 34 (TOURNIQUET CUFF) ×2
CUFF TOURN SGL QUICK 42 (TOURNIQUET CUFF) IMPLANT
CUFF TRNQT CYL 34X4.125X (TOURNIQUET CUFF) ×1 IMPLANT
DRAPE EXTREMITY T 121X128X90 (DISPOSABLE) ×2 IMPLANT
DRAPE HALF SHEET 40X57 (DRAPES) ×2 IMPLANT
DRAPE U-SHAPE 47X51 STRL (DRAPES) ×2 IMPLANT
DRSG PAD ABDOMINAL 8X10 ST (GAUZE/BANDAGES/DRESSINGS) ×2 IMPLANT
DRSG XEROFORM 1X8 (GAUZE/BANDAGES/DRESSINGS) ×1 IMPLANT
DURAPREP 26ML APPLICATOR (WOUND CARE) ×2 IMPLANT
ELECT CAUTERY BLADE 6.4 (BLADE) ×2 IMPLANT
ELECT REM PT RETURN 9FT ADLT (ELECTROSURGICAL) ×2
ELECTRODE REM PT RTRN 9FT ADLT (ELECTROSURGICAL) ×1 IMPLANT
FACESHIELD WRAPAROUND (MASK) ×4 IMPLANT
FACESHIELD WRAPAROUND OR TEAM (MASK) ×2 IMPLANT
FEMUR CMT CR STD SZ 6 RT KNEE (Joint) ×2 IMPLANT
FEMUR CMTD CR STD SZ 6 RT KNEE (Joint) IMPLANT
GAUZE PAD ABD 8X10 STRL (GAUZE/BANDAGES/DRESSINGS) ×1 IMPLANT
GAUZE SPONGE 4X4 12PLY STRL (GAUZE/BANDAGES/DRESSINGS) ×2 IMPLANT
GAUZE XEROFORM 1X8 LF (GAUZE/BANDAGES/DRESSINGS) ×2 IMPLANT
GLOVE BIOGEL PI IND STRL 8 (GLOVE) ×2 IMPLANT
GLOVE BIOGEL PI INDICATOR 8 (GLOVE) ×2
GLOVE ORTHO TXT STRL SZ7.5 (GLOVE) ×2 IMPLANT
GLOVE SURG ORTHO 8.0 STRL STRW (GLOVE) ×2 IMPLANT
GOWN STRL REUS W/ TWL LRG LVL3 (GOWN DISPOSABLE) IMPLANT
GOWN STRL REUS W/ TWL XL LVL3 (GOWN DISPOSABLE) ×2 IMPLANT
GOWN STRL REUS W/TWL LRG LVL3 (GOWN DISPOSABLE)
GOWN STRL REUS W/TWL XL LVL3 (GOWN DISPOSABLE) ×4
HANDPIECE INTERPULSE COAX TIP (DISPOSABLE) ×2
HDLS TROCR DRIL PIN KNEE 75 (PIN) ×8
IMMOBILIZER KNEE 20 (SOFTGOODS) ×2
IMMOBILIZER KNEE 20 THIGH 36 (SOFTGOODS) IMPLANT
IMMOBILIZER KNEE 22 UNIV (SOFTGOODS) ×2 IMPLANT
KIT BASIN OR (CUSTOM PROCEDURE TRAY) ×2 IMPLANT
KIT TURNOVER KIT B (KITS) ×2 IMPLANT
LINER TIB ASF PS CD/6-7 14 RT (Liner) ×1 IMPLANT
MANIFOLD NEPTUNE II (INSTRUMENTS) ×2 IMPLANT
NDL 18GX1X1/2 (RX/OR ONLY) (NEEDLE) IMPLANT
NEEDLE 18GX1X1/2 (RX/OR ONLY) (NEEDLE) IMPLANT
NS IRRIG 1000ML POUR BTL (IV SOLUTION) ×2 IMPLANT
PACK TOTAL JOINT (CUSTOM PROCEDURE TRAY) ×2 IMPLANT
PAD ARMBOARD 7.5X6 YLW CONV (MISCELLANEOUS) ×2 IMPLANT
PADDING CAST COTTON 6X4 STRL (CAST SUPPLIES) ×3 IMPLANT
PATELLA ZIMMER 29MM (Orthopedic Implant) ×2 IMPLANT
PIN DRILL HDLS TROCAR 75 4PK (PIN) IMPLANT
SCREW FEMALE HEX FIX 25X2.5 (ORTHOPEDIC DISPOSABLE SUPPLIES) ×1 IMPLANT
SET HNDPC FAN SPRY TIP SCT (DISPOSABLE) ×1 IMPLANT
SET PAD KNEE POSITIONER (MISCELLANEOUS) ×2 IMPLANT
STAPLER VISISTAT 35W (STAPLE) IMPLANT
STEM TIB ST PERS 14+30 (Stem) ×1 IMPLANT
STEM TIBIA 5 DEG SZ D R KNEE (Knees) IMPLANT
STRIP CLOSURE SKIN 1/2X4 (GAUZE/BANDAGES/DRESSINGS) IMPLANT
SUCTION FRAZIER HANDLE 10FR (MISCELLANEOUS) ×2
SUCTION TUBE FRAZIER 10FR DISP (MISCELLANEOUS) ×1 IMPLANT
SUT MNCRL AB 4-0 PS2 18 (SUTURE) IMPLANT
SUT VIC AB 0 CT1 27 (SUTURE) ×2
SUT VIC AB 0 CT1 27XBRD ANBCTR (SUTURE) ×1 IMPLANT
SUT VIC AB 1 CT1 27 (SUTURE) ×4
SUT VIC AB 1 CT1 27XBRD ANBCTR (SUTURE) ×2 IMPLANT
SUT VIC AB 2-0 CT1 27 (SUTURE) ×4
SUT VIC AB 2-0 CT1 TAPERPNT 27 (SUTURE) ×2 IMPLANT
SYR 50ML LL SCALE MARK (SYRINGE) IMPLANT
TIBIA STEM 5 DEG SZ D R KNEE (Knees) ×2 IMPLANT
TOWEL GREEN STERILE (TOWEL DISPOSABLE) ×2 IMPLANT
TOWEL GREEN STERILE FF (TOWEL DISPOSABLE) ×2 IMPLANT
TRAY CATH 16FR W/PLASTIC CATH (SET/KITS/TRAYS/PACK) IMPLANT
WRAP KNEE MAXI GEL POST OP (GAUZE/BANDAGES/DRESSINGS) ×2 IMPLANT

## 2022-02-08 NOTE — Transfer of Care (Signed)
Immediate Anesthesia Transfer of Care Note  Patient: Kaylee Salas  Procedure(s) Performed: RIGHT TOTAL KNEE ARTHROPLASTY (Right: Knee)  Patient Location: PACU  Anesthesia Type:Regional and Spinal  Level of Consciousness: drowsy  Airway & Oxygen Therapy: Patient Spontanous Breathing  Post-op Assessment: Report given to RN and Post -op Vital signs reviewed and stable  Post vital signs: Reviewed and stable  Last Vitals:  Vitals Value Taken Time  BP 90/66 02/08/22 1647  Temp    Pulse 99 02/08/22 1647  Resp 16 02/08/22 1647  SpO2 86 % 02/08/22 1647  Vitals shown include unvalidated device data.  Last Pain:  Vitals:   02/08/22 1340  TempSrc:   PainSc: 3       Patients Stated Pain Goal: 2 (57/49/35 [id number])  Complications: No notable events documented.

## 2022-02-08 NOTE — Op Note (Unsigned)
NAMECAELYN, ROUTE MEDICAL RECORD NO: [id number] ACCOUNT NO: [id number] DATE OF BIRTH: 1966/07/21 FACILITY: [facility] LOCATION: [facility] PHYSICIAN: Lind Guest. Ninfa Linden, MD  Operative Report   DATE OF PROCEDURE: 02/08/2022  PREOPERATIVE DIAGNOSES:  Severe end-stage arthritis and degenerative joint disease, right knee.  POSTOPERATIVE DIAGNOSES:  Severe end-stage arthritis and degenerative joint disease, right knee.  PROCEDURE:  Right total knee arthroplasty.  IMPLANTS:  Biomet/Zimmer Persona cemented knee system with size 6 right CR standard femur, size D right tibial tray, 14 mm medial congruent right polyethylene insert, size 29 patellar button.  SURGEON:  Lind Guest. Ninfa Linden, MD  ASSISTANT:  Erskine Emery, PA-C  ANESTHESIA:  1.  Right lower extremity adductor canal block. 2.  Spinal.  TOURNIQUET TIME:  65 minutes.  ANTIBIOTICS:  1 g IV vancomycin and 2 grams IV Ancef.  BLOOD LOSS:  Less than 100 mL.  COMPLICATIONS:  None.  INDICATIONS:  The patient is a 56 year old female who has dealt with obesity for many years now.  She unfortunately tore her ACL many years ago and had an ACL reconstruction of her right knee.  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  It was likely a very good ACL reconstruction at first, but the natural history of knee that was ligamentously disrupted such as  hers can lead to severe end-stage arthritis and she understands that as well.  Her x-rays show bone-on-bone wear.  Her right knee pain is daily and it is detrimentally affecting her mobility, her quality of life and her activities of daily living.  We  talked in length and detail about knee replacement surgery and how difficult this may be given her size combined with her soft bone and her previous ACL reconstruction, knowing that we need to get at least the  tibial interference screw out.  We talked in  length and detail about the surgery and described the risks and benefits including the risk of acute blood loss anemia, nerve or vessel injury, fracture, infection, DVT, implant failure and skin and soft tissue issues.  She understands our goals are  hopefully decrease pain, improve mobility and overall improve quality of life.  DESCRIPTION OF PROCEDURE:  After informed consent was obtained, appropriate right knee was marked.  Anesthesia obtained a right lower extremity adductor canal block in the holding room.  She was then brought to the operating room, sat up on the operating  table.  Spinal anesthesia was obtained.  She was laid in the supine position on the operating table.  Foley catheter was placed and a nonsterile tourniquet was placed on her upper right thigh.  Her right thigh, knee, leg, ankle and foot were prepped and  draped with DuraPrep and sterile drapes.  A timeout was called.  She was identified as correct patient, correct right knee.  Then used Esmarch to wrap out the leg and tourniquet was inflated to 300 mm of pressure.  We made a direct midline incision over  the patella and carried this proximally and distally.  We dissected down the knee joint, carried out a medial parapatellar arthrotomy.  We removed remnants of the previous ACL reconstruction as well as osteophytes from all 3 compartments of the knee.   We removed remnants of the medial and lateral meniscus.  Of note, the interference screw was not visible at all and we could see the trajectory of it  easily, but it was buried under bone from having been done so many years ago.  We knew this would make  it difficult to get the screw out, but we felt that we would proceed with the case as best we could until that point.  We first made our proximal tibia cut, correcting for varus and valgus and a 3-degree slope.  We made this cut to take 2 mm off the low  side and 10 mm off the high side.   We made that cut without difficulty, but I started to bring it down 2 more millimeters, which we did.  We then went to the femur and did our distal femoral cut for a right knee,  with intramedullary guide setting this  for 10 mm distal femoral cut and 5 degrees externally rotated.  We made that cut without difficulty, and brought the knee back down to full extension and had achieved full extension with a 10 mm block.  We then went back to the femur and put our femoral  sizing guide based off the epicondylar axis.  We put a 4-in-1 cutting block for a size 6 femur after we made our assessment based on the femoral sizing guide. We put a 4-in-1 cutting block for a size 6 femur, made our anterior and posterior cuts,  followed by our chamfer cuts and again we encountered very soft bone.  We then went back to the tibia and chose a size D right tibial tray for coverage on the tibial tubercle and then we started the process of drilling first with a post.  We were then  able to encounter the interference screw and we were able to pull the interference screw out through a drill hole and then we could drill the rest of the way and we did our keel punch as well.  We then trialled our size D right tibia with our size 6  right femur and we went up to a 12 mm polyethylene insert.  We felt the 12 mm was stable.  We made our patellar cut and drilled three holes for a size 29 patellar button.  We then removed all trial instrumentation from the knee and went to the back of  the knee and cleaned out some more soft tissue including a large osteophyte and loose bodies that we had not seen previously.  With that being said, we felt it was going to be more appropriate to place a 14 mm insert to tighten her up more.  We then  irrigated the knee with normal saline solution using pulsatile lavage.  We dried the knee real well and with the knee in a flexed position, we cemented our Biomet Zimmer Persona tibial tray, which we did put an  extra tapered stem, which was a 14 mm  diameter, 30 mm length stem and cemented the tibial tray.  We then cemented our size 6 right femur.  We placed our 14 mm medial congruent right polyethylene insert and I was pleased with the stability of that insert and cemented our size 29 patellar  button.  We then held the knee completely extended and compressed to allow the cement to harden.  Once the cement had hardened, I put her knee through several cycles of motion.  We were pleased with range of motion and stability.  We then let the  tourniquet down and hemostasis was obtained with electrocautery.  We then closed the arthrotomy with interrupted #1 Vicryl suture followed by 0 Vicryl  to close the deep tissue and 2-0 Vicryl to close the subcutaneous tissue.  The skin was closed with  staples.  A well-padded sterile dressing was applied.  She was taken to recovery room in stable condition with all final counts being correct.  No complications noted.  Of note, Benita Stabile, PA-C, did assist during the entire case and assistance was  crucial for facilitating all aspects of this case and was medically necessary for soft tissue retraction as well as helping guide implant placement and layered closure of the wound.   SHW D: 02/08/2022 4:21:53 pm T: 02/08/2022 10:43:00 pm  JOB: [id number]/ [id number]

## 2022-02-08 NOTE — Anesthesia Preprocedure Evaluation (Addendum)
Anesthesia Evaluation  Patient identified by MRN, date of birth, ID band Patient awake    Reviewed: Allergy & Precautions, NPO status , Patient's Chart, lab work & pertinent test results  History of Anesthesia Complications (+) PONV, Emergence Delirium and history of anesthetic complications  Airway Mallampati: II  TM Distance: >3 FB Neck ROM: Full    Dental no notable dental hx. (+) Dental Advisory Given   Pulmonary neg pulmonary ROS,    Pulmonary exam normal        Cardiovascular negative cardio ROS Normal cardiovascular exam     Neuro/Psych PSYCHIATRIC DISORDERS Anxiety Depression negative neurological ROS     GI/Hepatic negative GI ROS, Neg liver ROS,   Endo/Other  negative endocrine ROS  Renal/GU negative Renal ROS  negative genitourinary   Musculoskeletal  (+) Arthritis ,   Abdominal   Peds  Hematology negative hematology ROS (+)   Anesthesia Other Findings   Reproductive/Obstetrics                            Anesthesia Physical Anesthesia Plan  ASA: 2  Anesthesia Plan: Spinal and Regional   Post-op Pain Management: Regional block* and Tylenol PO (pre-op)*   Induction:   PONV Risk Score and Plan: 3 and Treatment may vary due to age or medical condition, Midazolam, Ondansetron and Dexamethasone  Airway Management Planned: Natural Airway  Additional Equipment:   Intra-op Plan:   Post-operative Plan:   Informed Consent: I have reviewed the patients History and Physical, chart, labs and discussed the procedure including the risks, benefits and alternatives for the proposed anesthesia with the patient or authorized representative who has indicated his/her understanding and acceptance.     Dental advisory given  Plan Discussed with: CRNA and Anesthesiologist  Anesthesia Plan Comments:        Anesthesia Quick Evaluation

## 2022-02-08 NOTE — Anesthesia Procedure Notes (Signed)
Procedure Name: MAC Date/Time: 02/08/2022 2:34 PM  Performed by: Lorie Phenix, CRNAPre-anesthesia Checklist: Patient identified, Emergency Drugs available, Suction available and Patient being monitored Patient Re-evaluated:Patient Re-evaluated prior to induction Oxygen Delivery Method: Simple face mask Placement Confirmation: positive ETCO2

## 2022-02-08 NOTE — Brief Op Note (Signed)
02/08/2022  4:23 PM  PATIENT:  Kaylee Salas  56 y.o. female  PRE-OPERATIVE DIAGNOSIS:  OSTEOARTHRITIS / DEGENERATIVE JOINT DISEASE RIGHT KNEE  POST-OPERATIVE DIAGNOSIS:  OSTEOARTHRITIS / DEGENERATIVE JOINT  PROCEDURE:  Procedure(s): RIGHT TOTAL KNEE ARTHROPLASTY (Right)  SURGEON:  Surgeon(s) and Role:    * Mcarthur Rossetti, MD - Primary  PHYSICIAN ASSISTANT:  Benita Stabile, PA-C  ANESTHESIA:   regional and spinal  EBL:  100 mL   COUNTS:  YES  TOURNIQUET:   Total Tourniquet Time Documented: Thigh (Right) - 65 minutes Total: Thigh (Right) - 65 minutes   DICTATION: .Other Dictation: Dictation Number [id number]  PLAN OF CARE: Admit for overnight observation  PATIENT DISPOSITION:  PACU - hemodynamically stable.   Delay start of Pharmacological VTE agent (>24hrs) due to surgical blood loss or risk of bleeding: no

## 2022-02-08 NOTE — Anesthesia Procedure Notes (Signed)
Spinal  Patient location during procedure: OR Start time: 02/08/2022 2:29 PM End time: 02/08/2022 2:38 PM Reason for block: surgical anesthesia Staffing Performed: anesthesiologist  Anesthesiologist: Duane Boston, MD Performed by: Duane Boston, MD Authorized by: Duane Boston, MD   Preanesthetic Checklist Completed: patient identified, IV checked, risks and benefits discussed, surgical consent, monitors and equipment checked, pre-op evaluation and timeout performed Spinal Block Patient position: sitting Prep: DuraPrep Patient monitoring: cardiac monitor, continuous pulse ox and blood pressure Approach: midline Location: L2-3 Injection technique: single-shot Needle Needle type: Pencan  Needle gauge: 24 G Needle length: 9 cm Assessment Events: CSF return Additional Notes Functioning IV was confirmed and monitors were applied. Sterile prep and drape, including hand hygiene and sterile gloves were used. The patient was positioned and the spine was prepped. The skin was anesthetized with lidocaine.  Free flow of clear CSF was obtained prior to injecting local anesthetic into the CSF.  The spinal needle aspirated freely following injection.  The needle was carefully withdrawn.  The patient tolerated the procedure well.

## 2022-02-08 NOTE — H&P (Signed)
TOTAL KNEE ADMISSION H&P  Patient is being admitted for right total knee arthroplasty.  Subjective:  Chief Complaint:right knee pain.  HPI: Kaylee Salas, 56 y.o. female, has a history of pain and functional disability in the right knee due to arthritis and has failed non-surgical conservative treatments for greater than 12 weeks to includeNSAID's and/or analgesics, corticosteriod injections, viscosupplementation injections, flexibility and strengthening excercises, use of assistive devices, weight reduction as appropriate, and activity modification.  Onset of symptoms was gradual, starting 5 years ago with gradually worsening course since that time. The patient noted prior procedures on the knee to include  arthroscopy and ACL reconstruction on the right knee(s).  Patient currently rates pain in the right knee(s) at 10 out of 10 with activity. Patient has night pain, worsening of pain with activity and weight bearing, pain that interferes with activities of daily living, pain with passive range of motion, crepitus, and joint swelling.  Patient has evidence of subchondral sclerosis, periarticular osteophytes, and joint space narrowing by imaging studies. There is no active infection.  Patient Active Problem List   Diagnosis Date Noted   Abnormal thyroid blood test 10/15/2021   Unilateral primary osteoarthritis, right hip 03/24/2021   Unilateral primary osteoarthritis, right knee 05/27/2020   Unilateral primary osteoarthritis, left hip 10/30/2018   Status post total replacement of left hip 10/30/2018   Past Medical History:  Diagnosis Date   Anemia    Anxiety    Arthritis    Complication of anesthesia    "woke up panicking/fighting"   Depression    Osteoporosis    PONV (postoperative nausea and vomiting)    Unilateral primary osteoarthritis, left hip 10/30/2018    Past Surgical History:  Procedure Laterality Date   CHOLECYSTECTOMY  2004   GASTRIC BYPASS     HAND SURGERY Right     ring finger reconstruction   HERNIA REPAIR     KNEE ARTHROSCOPY W/ ACL RECONSTRUCTION Right 1997   TOTAL HIP ARTHROPLASTY Left 10/30/2018   Procedure: LEFT TOTAL HIP ARTHROPLASTY ANTERIOR APPROACH;  Surgeon: Mcarthur Rossetti, MD;  Location: [facility];  Service: Orthopedics;  Laterality: Left;   UMBILICAL HERNIA REPAIR  2004    Current Facility-Administered Medications  Medication Dose Route Frequency Provider Last Rate Last Admin   ceFAZolin (ANCEF) IVPB 2g/100 mL premix  2 g Intravenous On Call to OR Pete Pelt, PA-C       fentaNYL (SUBLIMAZE) 100 MCG/2ML injection            fentaNYL (SUBLIMAZE) 100 MCG/2ML injection            fentaNYL (SUBLIMAZE) injection 200 mcg  200 mcg Intravenous Once Duane Boston, MD       lactated ringers infusion   Intravenous Continuous Effie Berkshire, MD 10 mL/hr at 02/08/22 1308 Continued from Pre-op at 02/08/22 1308   midazolam (VERSED) 2 MG/2ML injection            midazolam (VERSED) 2 MG/2ML injection            midazolam (VERSED) injection 4 mg  4 mg Intravenous Once Duane Boston, MD       tranexamic acid (CYKLOKAPRON) IVPB 1,000 mg  1,000 mg Intravenous To OR Pete Pelt, PA-C       vancomycin (VANCOCIN) IVPB 1000 mg/200 mL premix  1,000 mg Intravenous Once Mcarthur Rossetti, MD 200 mL/hr at 02/08/22 1309 1,000 mg at 02/08/22 1309   No Known Allergies  Social History  Tobacco Use   Smoking status: Never   Smokeless tobacco: Never  Substance Use Topics   Alcohol use: Yes    Comment: occassionally    Family History  Problem Relation Age of Onset   Hypertension Mother    Hyperlipidemia Mother    Heart attack Mother    Heart attack Father    Hyperlipidemia Father    Hypertension Father    Heart failure Father      Review of Systems  Musculoskeletal:  Positive for back pain, gait problem and joint swelling.  All other systems reviewed and are negative.   Objective:  Physical Exam Vitals reviewed.  Constitutional:       Appearance: Normal appearance.  HENT:     Head: Normocephalic and atraumatic.  Eyes:     Extraocular Movements: Extraocular movements intact.     Pupils: Pupils are equal, round, and reactive to light.  Cardiovascular:     Rate and Rhythm: Normal rate and regular rhythm.     Pulses: Normal pulses.  Pulmonary:     Effort: Pulmonary effort is normal.     Breath sounds: Normal breath sounds.  Abdominal:     Palpations: Abdomen is soft.  Musculoskeletal:     Cervical back: Normal range of motion and neck supple.     Right knee: Effusion, bony tenderness and crepitus present. Decreased range of motion. Tenderness present over the medial joint line, lateral joint line and patellar tendon.  Neurological:     Mental Status: She is alert and oriented to person, place, and time.  Psychiatric:        Behavior: Behavior normal.     Vital signs in last 24 hours: Temp:  [97.5 F (36.4 C)] 97.5 F (36.4 C) (06/13 1211) Pulse Rate:  [90-103] 95 (06/13 1325) Resp:  [12-24] 12 (06/13 1325) BP: (123-144)/(63-80) 123/74 (06/13 1325) SpO2:  [93 %-100 %] 96 % (06/13 1325) Weight:  [88.5 kg] 88.5 kg (06/13 1211)  Labs:   Estimated body mass index is 35.67 kg/m as calculated from the following:   Height as of this encounter: '5\' 2"'$  (1.575 m).   Weight as of this encounter: 88.5 kg.   Imaging Review Plain radiographs demonstrate severe degenerative joint disease of the right knee(s). The overall alignment ismild varus. The bone quality appears to be good for age and reported activity level.      Assessment/Plan:  End stage arthritis, right knee   The patient history, physical examination, clinical judgment of the provider and imaging studies are consistent with end stage degenerative joint disease of the right knee(s) and total knee arthroplasty is deemed medically necessary. The treatment options including medical management, injection therapy arthroscopy and arthroplasty were  discussed at length. The risks and benefits of total knee arthroplasty were presented and reviewed. The risks due to aseptic loosening, infection, stiffness, patella tracking problems, thromboembolic complications and other imponderables were discussed. The patient acknowledged the explanation, agreed to proceed with the plan and consent was signed. Patient is being admitted for inpatient treatment for surgery, pain control, PT, OT, prophylactic antibiotics, VTE prophylaxis, progressive ambulation and ADL's and discharge planning. The patient is planning to be discharged home with home health services

## 2022-02-08 NOTE — Anesthesia Procedure Notes (Signed)
Anesthesia Regional Block: Adductor canal block   Pre-Anesthetic Checklist: , timeout performed,  Correct Patient, Correct Site, Correct Laterality,  Correct Procedure, Correct Position, site marked,  Risks and benefits discussed,  Surgical consent,  Pre-op evaluation,  At surgeon's request and post-op pain management  Laterality: Right  Prep: chloraprep       Needles:  Injection technique: Single-shot  Needle Type: Stimulator Needle - 80     Needle Length: 10cm  Needle Gauge: 21     Additional Needles:   Narrative:  Start time: 02/08/2022 1:18 PM End time: 02/08/2022 1:28 PM Injection made incrementally with aspirations every 5 mL.  Performed by: Personally  Anesthesiologist: Duane Boston, MD

## 2022-02-09 ENCOUNTER — Encounter (HOSPITAL_COMMUNITY): Payer: Self-pay | Admitting: Orthopaedic Surgery

## 2022-02-09 DIAGNOSIS — Z8249 Family history of ischemic heart disease and other diseases of the circulatory system: Secondary | ICD-10-CM | POA: Diagnosis not present

## 2022-02-09 DIAGNOSIS — M1611 Unilateral primary osteoarthritis, right hip: Secondary | ICD-10-CM | POA: Diagnosis present

## 2022-02-09 DIAGNOSIS — E669 Obesity, unspecified: Secondary | ICD-10-CM | POA: Diagnosis present

## 2022-02-09 DIAGNOSIS — Z83438 Family history of other disorder of lipoprotein metabolism and other lipidemia: Secondary | ICD-10-CM | POA: Diagnosis not present

## 2022-02-09 DIAGNOSIS — Z6835 Body mass index (BMI) 35.0-35.9, adult: Secondary | ICD-10-CM | POA: Diagnosis not present

## 2022-02-09 DIAGNOSIS — M1711 Unilateral primary osteoarthritis, right knee: Secondary | ICD-10-CM | POA: Diagnosis present

## 2022-02-09 DIAGNOSIS — Z9884 Bariatric surgery status: Secondary | ICD-10-CM | POA: Diagnosis not present

## 2022-02-09 DIAGNOSIS — M810 Age-related osteoporosis without current pathological fracture: Secondary | ICD-10-CM | POA: Diagnosis present

## 2022-02-09 DIAGNOSIS — Z96642 Presence of left artificial hip joint: Secondary | ICD-10-CM | POA: Diagnosis present

## 2022-02-09 LAB — CBC
HCT: 29 % — ABNORMAL LOW (ref 36.0–46.0)
Hemoglobin: 9.1 g/dL — ABNORMAL LOW (ref 12.0–15.0)
MCH: 26.1 pg (ref 26.0–34.0)
MCHC: 31.4 g/dL (ref 30.0–36.0)
MCV: 83.1 fL (ref 80.0–100.0)
Platelets: 252 10*3/uL (ref 150–400)
RBC: 3.49 MIL/uL — ABNORMAL LOW (ref 3.87–5.11)
RDW: 15.3 % (ref 11.5–15.5)
WBC: 10.2 10*3/uL (ref 4.0–10.5)
nRBC: 0 % (ref 0.0–0.2)

## 2022-02-09 LAB — BASIC METABOLIC PANEL
Anion gap: 3 — ABNORMAL LOW (ref 5–15)
BUN: 9 mg/dL (ref 6–20)
CO2: 26 mmol/L (ref 22–32)
Calcium: 8 mg/dL — ABNORMAL LOW (ref 8.9–10.3)
Chloride: 112 mmol/L — ABNORMAL HIGH (ref 98–111)
Creatinine, Ser: 0.66 mg/dL (ref 0.44–1.00)
GFR, Estimated: >60 mL/min (ref >60)
Glucose, Bld: 125 mg/dL — ABNORMAL HIGH (ref 70–99)
Potassium: 3.8 mmol/L (ref 3.5–5.1)
Sodium: 141 mmol/L (ref 135–145)

## 2022-02-09 MED ORDER — FLUCONAZOLE 150 MG PO TABS
150.0000 mg | ORAL_TABLET | Freq: Once | ORAL | Status: AC
  Administered 2022-02-09: 150 mg via ORAL
  Filled 2022-02-09: qty 1

## 2022-02-09 NOTE — Addendum Note (Signed)
Addendum  created 02/09/22 1713 by Duane Boston, MD   [place] recorded in [place], [place] filed

## 2022-02-09 NOTE — Discharge Instructions (Signed)

## 2022-02-09 NOTE — Progress Notes (Signed)
Subjective: 1 Day Post-Op Procedure(s) (LRB): RIGHT TOTAL KNEE ARTHROPLASTY (Right) Patient reports pain as moderate.    Objective: Vital signs in last 24 hours: Temp:  [97.5 F (36.4 C)-98.2 F (36.8 C)] 97.8 F (36.6 C) (06/14 0436) Pulse Rate:  [76-107] 79 (06/14 0436) Resp:  [12-24] 17 (06/14 0436) BP: (90-144)/(54-84) 111/59 (06/14 0436) SpO2:  [92 %-100 %] 92 % (06/14 0436) Weight:  [88.5 kg] 88.5 kg (06/13 1211)  Intake/Output from previous day: 06/13 0701 - 06/14 0700 In: 1000 [I.V.:1000] Out: 1650 [Urine:1550; Blood:100] Intake/Output this shift: No intake/output data recorded.  Recent Labs    02/09/22 0202  HGB 9.1*   Recent Labs    02/09/22 0202  WBC 10.2  RBC 3.49*  HCT 29.0*  PLT 252   Recent Labs    02/09/22 0202  NA 141  K 3.8  CL 112*  CO2 26  BUN 9  CREATININE 0.66  GLUCOSE 125*  CALCIUM 8.0*   No results for input(s): "LABPT", "INR" in the last 72 hours.  Sensation intact distally Intact pulses distally Dorsiflexion/Plantar flexion intact Incision: dressing C/D/I Compartment soft   Assessment/Plan: 1 Day Post-Op Procedure(s) (LRB): RIGHT TOTAL KNEE ARTHROPLASTY (Right) Up with therapy      Mcarthur Rossetti 02/09/2022, 7:54 AM

## 2022-02-09 NOTE — Progress Notes (Signed)
Physical Therapy Treatment Patient Details Name: Kaylee Salas MRN: [id number] DOB: [birth year] Today's Date: 02/09/2022   History of Present Illness Pt is 56yo F s/p R TKA on 6/13. Significant PMH: anxiety, depression, anemia, L THA (2020)    PT Comments    Pt reporting she has been doing HEP since previous session this AM and misunderstood therapist instructions on when to perform exercises. Pt now reports increased pain in R knee from exercises and requested to return to bed after getting to the bathroom. Pt educated on importance of being able to ambulate household distances and negotiate stairs for safe discharge home. Pt agreeable to attempting further ambulation and stair training tomorrow and verbalizes understanding of appropriate progression of HEP.   Recommendations for follow up therapy are one component of a multi-disciplinary discharge planning process, led by the attending physician.  Recommendations may be updated based on patient status, additional functional criteria and insurance authorization.  Follow Up Recommendations  Follow physician's recommendations for discharge plan and follow up therapies     Assistance Recommended at Discharge PRN  Patient can return home with the following A little help with walking and/or transfers;A little help with bathing/dressing/bathroom;Assistance with cooking/housework;Assist for transportation;Help with stairs or ramp for entrance   Equipment Recommendations  Rolling walker (2 wheels) (bariatric equipment)    Recommendations for Other Services       Precautions / Restrictions Precautions Precautions: Knee;Fall Precaution Booklet Issued: Yes (comment) Precaution Comments: Educated on WBAT and no pillow/towel positioned under the knee Restrictions Weight Bearing Restrictions: Yes RLE Weight Bearing: Weight bearing as tolerated     Mobility  Bed Mobility Overal bed mobility: Modified Independent             General bed  mobility comments: HOB elevated, increased time, no physical assist required    Transfers Overall transfer level: Needs assistance Equipment used: Rolling walker (2 wheels) Transfers: Sit to/from Stand Sit to Stand: Supervision           General transfer comment: supervision for safety    Ambulation/Gait Ambulation/Gait assistance: Min guard Gait Distance (Feet): 20 Feet (to and from bathroom) Assistive device: Rolling walker (2 wheels) Gait Pattern/deviations: Step-to pattern, Decreased step length - left, Decreased stance time - right, Decreased weight shift to right Gait velocity: decreased Gait velocity interpretation: <1.8 ft/sec, indicate of risk for recurrent falls (Simultaneous filing. User may not have seen previous data.)   General Gait Details: Pt demonstrated good recall of sequencing, cues for keeping RW close   Stairs             Wheelchair Mobility    Modified Rankin (Stroke Patients Only)       Balance Overall balance assessment: Needs assistance Sitting-balance support: No upper extremity supported, Feet supported Sitting balance-Leahy Scale: Good     Standing balance support: Bilateral upper extremity supported, During functional activity Standing balance-Leahy Scale: Poor Standing balance comment: reliance on RW, took some steps without RW but reliant on external support for balance                            Cognition Arousal/Alertness: Awake/alert Behavior During Therapy: WFL for tasks assessed/performed, Anxious Overall Cognitive Status: No family/caregiver present to determine baseline cognitive functioning                                 General Comments: Pt  requiring repeated instructions for mobility and safety, stated she lives alone and is not married but asked therapist if she could call her husband at the end of session        Exercises Total Joint Exercises Ankle Circles/Pumps: Right, 10  reps Quad Sets: Right, 10 reps Heel Slides: Right, 10 reps Goniometric ROM: 11-70    General Comments        Pertinent Vitals/Pain Pain Assessment Pain Assessment: Faces Pain Score: 5  Faces Pain Scale: Hurts little more Pain Location: R knee and R hip Pain Descriptors / Indicators: Discomfort, Operative site guarding Pain Intervention(s): Monitored during session, Limited activity within patient's tolerance, Ice applied    Home Living                          Prior Function            PT Goals (current goals can now be found in the care plan section) Acute Rehab PT Goals Patient Stated Goal: to return to work PT Goal Formulation: With patient Time For Goal Achievement: 02/23/22 Potential to Achieve Goals: Good Progress towards PT goals: Progressing toward goals    Frequency    7X/week      PT Plan Current plan remains appropriate    Co-evaluation              AM-PAC PT "6 Clicks" Mobility   Outcome Measure  Help needed turning from your back to your side while in a flat bed without using bedrails?: None Help needed moving from lying on your back to sitting on the side of a flat bed without using bedrails?: None Help needed moving to and from a bed to a chair (including a wheelchair)?: A Little Help needed standing up from a chair using your arms (e.g., wheelchair or bedside chair)?: A Little Help needed to walk in hospital room?: A Little Help needed climbing 3-5 steps with a railing? : A Lot 6 Click Score: 19    End of Session Equipment Utilized During Treatment: Gait belt Activity Tolerance: Patient limited by pain Patient left: in bed;with call bell/phone within reach Nurse Communication: Mobility status PT Visit Diagnosis: Other abnormalities of gait and mobility (R26.89);Muscle weakness (generalized) (M62.81);Difficulty in walking, not elsewhere classified (R26.2);Pain Pain - Right/Left: Right Pain - part of body: Knee     Time:  1449-1510 PT Time Calculation (min) (ACUTE ONLY): 21 min  Charges:  $Therapeutic Activity: 8-22 mins                    Mackie Pai, SPT Acute Rehabilitation Services  Office: [phone]    Mackie Pai 02/09/2022, 3:40 PM

## 2022-02-09 NOTE — Plan of Care (Signed)
  Problem: Activity: Goal: Risk for activity intolerance will decrease Outcome: Progressing   Problem: Nutrition: Goal: Adequate nutrition will be maintained Outcome: Progressing   

## 2022-02-09 NOTE — Evaluation (Addendum)
Physical Therapy Evaluation Patient Details Name: Kaylee Salas MRN: [id number] DOB: Sep 17, 1965 Today's Date: 02/09/2022  History of Present Illness  Pt is 56yo F s/p R TKA on 6/13. Significant PMH: anxiety, depression, anemia, L THA (2020)  Clinical Impression  Prior to admission, pt was ambulating with crutches or RW and was unable to participate fully in IADLs due to injuries sustained in December. Pt requiring minA for transfers and ambulation of 12f with RW. Pt educated on HEP and positioning of knee. Due to lack of caregiver support and limited ambulation, pt may benefit from another day of acute PT to progress ambulation, stair training, and overall functional independence to ensure safe discharge home.     Recommendations for follow up therapy are one component of a multi-disciplinary discharge planning process, led by the attending physician.  Recommendations may be updated based on patient status, additional functional criteria and insurance authorization.  Follow Up Recommendations Follow physician's recommendations for discharge plan and follow up therapies    Assistance Recommended at Discharge Intermittent Supervision/Assistance  Patient can return home with the following  A little help with walking and/or transfers;A little help with bathing/dressing/bathroom;Assistance with cooking/housework;Assist for transportation;Help with stairs or ramp for entrance    Equipment Recommendations Rolling walker (2 wheels);BSC/3in1 (bariatric equipment)  Recommendations for Other Services       Functional Status Assessment Patient has had a recent decline in their functional status and demonstrates the ability to make significant improvements in function in a reasonable and predictable amount of time.     Precautions / Restrictions Precautions Precautions: Knee;Fall Precaution Booklet Issued: Yes (comment) Precaution Comments: Educated on WBAT and no pillow/towel positioned under the  knee Restrictions Weight Bearing Restrictions: Yes RLE Weight Bearing: Weight bearing as tolerated      Mobility  Bed Mobility Overal bed mobility: Modified Independent             General bed mobility comments: HOB elevated, increased time, no physical assist required    Transfers Overall transfer level: Needs assistance Equipment used: Rolling walker (2 wheels) Transfers: Sit to/from Stand Sit to Stand: Min assist, +2 physical assistance           General transfer comment: Pt with significant forward lean to clear hips from bed, VCs for hand placement and minA of 2 to achieve upright position    Ambulation/Gait Ambulation/Gait assistance: Min assist, +2 safety/equipment (chair follow) Gait Distance (Feet): 20 Feet Assistive device: Rolling walker (2 wheels) Gait Pattern/deviations: Step-to pattern, Decreased step length - left, Decreased stance time - right, Decreased weight shift to right Gait velocity: decreased Gait velocity interpretation: 1.31 - 2.62 ft/sec, indicative of limited community ambulator   General Gait Details: VCs for sequencing, R foot clearance, and heel strike. pt requesting to sit after 216fdue to pain and fatigue.  Stairs            Wheelchair Mobility    Modified Rankin (Stroke Patients Only)       Balance Overall balance assessment: Needs assistance Sitting-balance support: No upper extremity supported, Feet supported Sitting balance-Leahy Scale: Good     Standing balance support: Bilateral upper extremity supported, During functional activity Standing balance-Leahy Scale: Poor Standing balance comment: reliance on RW                             Pertinent Vitals/Pain Pain Assessment Pain Assessment: 0-10 Pain Score: 5  Pain Location: R knee Pain Descriptors /  Indicators: Discomfort, Grimacing, Guarding, Operative site guarding Pain Intervention(s): Limited activity within patient's tolerance, Monitored  during session, Ice applied    Home Living Family/patient expects to be discharged to:: Private residence Living Arrangements: Alone Available Help at Discharge:  (el[place] neighbors who can check in but not able to provide physical assistance) Type of Home: House Home Access: Stairs to enter Entrance Stairs-Rails: None Entrance Stairs-Number of Steps: 6   Home Layout: One level Home Equipment: Cane - single point;Rollator (4 wheels);Crutches (Pt states RW is too  big)      Prior Function Prior Level of Function : Needs assist             Mobility Comments: Using crutches and walker since decemeber ADLs Comments: States she hasnt been able to engage in cleaning and other housework as frequently since december     Hand Dominance        Extremity/Trunk Assessment   Upper Extremity Assessment Upper Extremity Assessment: Overall WFL for tasks assessed    Lower Extremity Assessment Lower Extremity Assessment: RLE deficits/detail RLE Deficits / Details: Expected post op deficits, able to perform SLR    Cervical / Trunk Assessment Cervical / Trunk Assessment: Kyphotic  Communication   Communication: No difficulties  Cognition Arousal/Alertness: Awake/alert Behavior During Therapy: WFL for tasks assessed/performed, Anxious Overall Cognitive Status: No family/caregiver present to determine baseline cognitive functioning                                 General Comments: Pt requiring repeated instructions for mobility and safety, stated she lives alone and is not married but asked therapist if she could call her husband at the end of session        General Comments      Exercises Total Joint Exercises Ankle Circles/Pumps: Right, 10 reps Quad Sets: Right, 10 reps Heel Slides: Right, 10 reps Goniometric ROM: 11-70   Assessment/Plan    PT Assessment Patient needs continued PT services  PT Problem List Decreased strength;Decreased range of  motion;Decreased activity tolerance;Decreased mobility;Decreased knowledge of use of DME;Decreased safety awareness;Pain       PT Treatment Interventions DME instruction;Gait training;Stair training;Functional mobility training;Therapeutic activities;Therapeutic exercise;Patient/family education;Manual techniques    PT Goals (Current goals can be found in the Care Plan section)  Acute Rehab PT Goals Patient Stated Goal: to return to work PT Goal Formulation: With patient Time For Goal Achievement: 02/23/22 Potential to Achieve Goals: Good    Frequency 7X/week     Co-evaluation               AM-PAC PT "6 Clicks" Mobility  Outcome Measure Help needed turning from your back to your side while in a flat bed without using bedrails?: None Help needed moving from lying on your back to sitting on the side of a flat bed without using bedrails?: None Help needed moving to and from a bed to a chair (including a wheelchair)?: A Little Help needed standing up from a chair using your arms (e.g., wheelchair or bedside chair)?: A Little Help needed to walk in hospital room?: A Little Help needed climbing 3-5 steps with a railing? : A Lot 6 Click Score: 19    End of Session Equipment Utilized During Treatment: Gait belt Activity Tolerance: Patient limited by pain Patient left: in chair;with call bell/phone within reach Nurse Communication: Mobility status PT Visit Diagnosis: Other abnormalities of gait and mobility (R26.89);Muscle weakness (  generalized) (M62.81);Difficulty in walking, not elsewhere classified (R26.2);Pain Pain - Right/Left: Right Pain - part of body: Knee    Time: 8138-8719 PT Time Calculation (min) (ACUTE ONLY): 43 min   Charges:   PT Evaluation $PT Eval Low Complexity: 1 Low PT Treatments $Gait Training: 8-22 mins $Therapeutic Activity: 8-22 mins      Mackie Pai, SPT Acute Rehabilitation Services  Office: [phone]   Mackie Pai 02/09/2022, 10:27  AM

## 2022-02-09 NOTE — Anesthesia Postprocedure Evaluation (Signed)
Anesthesia Post Note  Patient: Kaylee Salas  Procedure(s) Performed: RIGHT TOTAL KNEE ARTHROPLASTY (Right: Knee)     Patient location during evaluation: PACU Anesthesia Type: Regional Level of consciousness: awake and alert Pain management: pain level controlled Vital Signs Assessment: post-procedure vital signs reviewed and stable Respiratory status: spontaneous breathing and respiratory function stable Cardiovascular status: blood pressure returned to baseline and stable Postop Assessment: spinal receding Anesthetic complications: no   No notable events documented.                 Keshan Reha DANIEL

## 2022-02-10 MED ORDER — HYDROMORPHONE HCL 2 MG PO TABS
4.0000 mg | ORAL_TABLET | ORAL | Status: DC | PRN
  Administered 2022-02-10 – 2022-02-11 (×6): 4 mg via ORAL
  Filled 2022-02-10 (×6): qty 2

## 2022-02-10 NOTE — Progress Notes (Signed)
Physical Therapy Treatment Patient Details Name: Kaylee Salas MRN: [id number] DOB: 04/26/1966 Today's Date: 02/10/2022   History of Present Illness Pt is 56 y.o. female s/p elective R TKA on 02/08/22. PMH includes anxiety, depression, anemia, L THA (2020).   PT Comments    Pt slowly progressing with mobility. Today's session focused on stair training with crutches (per pt request, what she uses on stairs at home); pt adamant about also holding onto rails with stair training despite not having rails at home. Pt remains difficult to educate/reason with at times, demonstrates decreased awareness and attention. Encouraged continued conversation with family/neighbors for increased support upon return home. Will continue to follow acutely to address established goals.     Recommendations for follow up therapy are one component of a multi-disciplinary discharge planning process, led by the attending physician.  Recommendations may be updated based on patient status, additional functional criteria and insurance authorization.  Follow Up Recommendations  Follow physician's recommendations for discharge plan and follow up therapies     Assistance Recommended at Discharge Intermittent Supervision/Assistance  Patient can return home with the following A little help with bathing/dressing/bathroom;Assistance with cooking/housework;Assist for transportation;Help with stairs or ramp for entrance   Equipment Recommendations  Rolling walker (2 wheels)    Recommendations for Other Services       Precautions / Restrictions Precautions Precautions: Knee;Fall Restrictions Weight Bearing Restrictions: Yes RLE Weight Bearing: Weight bearing as tolerated     Mobility  Bed Mobility Overal bed mobility: Modified Independent             General bed mobility comments: increased time, pt using gait belt to assist RLE to EOB    Transfers Overall transfer level: Needs assistance Equipment used:  Rolling walker (2 wheels), Crutches Transfers: Sit to/from Stand Sit to Stand: Supervision, Min guard           General transfer comment: supervision for sit<>stand from EOB with RW. close min guard for stability standing from recliner with bilateral crutches (pt preference for stair training), assist to hold crutches and help pt place under forearms; upon return to sit, demonstrated correct technique for pt to manage crutches herself with sit<>stand    Ambulation/Gait Ambulation/Gait assistance: Min guard Gait Distance (Feet): 8 Feet Assistive device: Crutches Gait Pattern/deviations: Step-to pattern, Decreased weight shift to right, Trunk flexed, Antalgic Gait velocity: decreased     General Gait Details: pt wanting to do "warm up lap" with crutches before stair training with them; pt reports fearful as crutches slick on gym floor, close min guard for balance   Stairs Stairs: Yes Stairs assistance: Min guard, Min assist Stair Management: Two rails, Step to pattern, Forwards, With crutches Number of Stairs: 3 (shorter height steps) General stair comments: pt with inconsistent receptiveness to education despite multiple attempts with stair training; pt adamant about using crutches on steps while also holding onto rails intermittently (though reminded pt she does not have rails at home); max verbal cues for sequencing and safety with cruthches; pt ultimately unwilling to try without rails ("this is how I have to do it")   Wheelchair Mobility    Modified Rankin (Stroke Patients Only)       Balance Overall balance assessment: Needs assistance Sitting-balance support: No upper extremity supported, Feet supported Sitting balance-Leahy Scale: Good     Standing balance support: Bilateral upper extremity supported, During functional activity Standing balance-Leahy Scale: Fair Standing balance comment: can static stand without UE support; reliant on UE support to ambulate  Cognition Arousal/Alertness: Awake/alert Behavior During Therapy: Anxious, Flat affect Overall Cognitive Status: No family/caregiver present to determine baseline cognitive functioning Area of Impairment: Attention, Memory, Following commands, Safety/judgement, Awareness, Problem solving                   Current Attention Level: Sustained, Selective Memory: Decreased short-term memory, Decreased recall of precautions Following Commands: Follows one step commands with increased time Safety/Judgement: Decreased awareness of safety, Decreased awareness of deficits Awareness: Emergent Problem Solving: Requires verbal cues General Comments: pt remains repetitive in conversation, focused on irrelavant topics despite attempts to redirect to task/conversation. still with decreased awareness about safety with return home and need to practice things to recreate home set-up (i.e. pt adamant about stair training with rail support despite not having rails at home)        Exercises Total Joint Exercises Long Arc Quad: AROM, Right, Seated (EOB, able to perform partial range) Knee Flexion: AAROM, Right, Seated (EOB, overpressure/stretch with LLE)    General Comments General comments (skin integrity, edema, etc.): HR up to 139 with bed mobility, HR 126 post-stair training; RN aware      Pertinent Vitals/Pain Pain Assessment Pain Assessment: Faces Faces Pain Scale: Hurts even more Pain Location: R knee Pain Descriptors / Indicators: Discomfort, Operative site guarding, Throbbing Pain Intervention(s): Monitored during session, Limited activity within patient's tolerance, Premedicated before session    Home Living                          Prior Function            PT Goals (current goals can now be found in the care plan section) Progress towards PT goals: Progressing toward goals (slowly)    Frequency    7X/week      PT Plan Current  plan remains appropriate    Co-evaluation              AM-PAC PT "6 Clicks" Mobility   Outcome Measure  Help needed turning from your back to your side while in a flat bed without using bedrails?: None Help needed moving from lying on your back to sitting on the side of a flat bed without using bedrails?: None Help needed moving to and from a bed to a chair (including a wheelchair)?: A Little Help needed standing up from a chair using your arms (e.g., wheelchair or bedside chair)?: A Little Help needed to walk in hospital room?: A Little Help needed climbing 3-5 steps with a railing? : A Lot 6 Click Score: 19    End of Session Equipment Utilized During Treatment: Gait belt Activity Tolerance: Patient limited by pain Patient left: in chair;with call bell/phone within reach;Other (comment) (dovetail for OT session) Nurse Communication: Mobility status PT Visit Diagnosis: Other abnormalities of gait and mobility (R26.89);Muscle weakness (generalized) (M62.81);Difficulty in walking, not elsewhere classified (R26.2);Pain Pain - Right/Left: Right Pain - part of body: Knee     Time: 3382-5053 PT Time Calculation (min) (ACUTE ONLY): 25 min  Charges:  $Gait Training: 8-22 mins $Therapeutic Activity: 8-22 mins                      Mabeline Caras, PT, DPT Acute Rehabilitation Services  Pager [phone] Office [place] 02/10/2022, 2:54 PM

## 2022-02-10 NOTE — Progress Notes (Signed)
Patient ID: Kaylee Salas, female   DOB: [birth year], 56 y.o.   MRN: [id number] Very slow going mobility secondary to the patient's pain tolerance.  She said she had a rough evening.  It is now postoperative day 2.  Her vital signs are stable.  Her right operative knee is stable and the dressing is clean and dry.  With keeping her here now as an inpatient is her poor mobility.  Hopefully that will increase as the day and next a comes along.

## 2022-02-10 NOTE — Evaluation (Signed)
Occupational Therapy Evaluation Patient Details Name: Kaylee Salas MRN: [id number] DOB: Oct 06, 1965 Today's Date: 02/10/2022   History of Present Illness Pt is 56 y.o. female s/p elective R TKA on 02/08/22. PMH includes anxiety, depression, anemia, L THA (2020).   Clinical Impression   Pt typically walks with a RW and is able to perform self care, light meal prep and light housekeeping. She lives alone and will not have consistent assistance upon discharge. Pt presents with chronic L hip pain and post operative L knee pain and decreased dynamic standing balance. She reports having AE from her previous hip replacement. Pt needs up to min guard assist for ambulation with RW and set up to min assist for ADLs. Will follow acutely.     Recommendations for follow up therapy are one component of a multi-disciplinary discharge planning process, led by the attending physician.  Recommendations may be updated based on patient status, additional functional criteria and insurance authorization.   Follow Up Recommendations  Follow physician's recommendations for discharge plan and follow up    Assistance Recommended at Discharge Intermittent Supervision/Assistance  Patient can return home with the following A little help with bathing/dressing/bathroom;Help with stairs or ramp for entrance;A little help with walking and/or transfers;Assistance with cooking/housework;Assist for transportation    Functional Status Assessment  Patient has had a recent decline in their functional status and demonstrates the ability to make significant improvements in function in a reasonable and predictable amount of time.  Equipment Recommendations  None recommended by OT    Recommendations for Other Services       Precautions / Restrictions Precautions Precautions: Knee;Fall Restrictions Weight Bearing Restrictions: Yes RLE Weight Bearing: Weight bearing as tolerated      Mobility Bed Mobility Overal bed  mobility: Modified Independent             General bed mobility comments: use of gait belt to assist L LE into bed    Transfers Overall transfer level: Needs assistance Equipment used: Rolling walker (2 wheels) Transfers: Sit to/from Stand Sit to Stand: Supervision           General transfer comment: from chair      Balance Overall balance assessment: Needs assistance   Sitting balance-Leahy Scale: Good     Standing balance support: Bilateral upper extremity supported, During functional activity Standing balance-Leahy Scale: Fair Standing balance comment: can static stand without UE support; reliant on UE support to ambulate                           ADL either performed or assessed with clinical judgement   ADL Overall ADL's : Needs assistance/impaired Eating/Feeding: Independent;Sitting   Grooming: Supervision/safety;Standing   Upper Body Bathing: Set up;Sitting   Lower Body Bathing: Minimal assistance;Sit to/from stand Lower Body Bathing Details (indicate cue type and reason): pt has long bath sponge and reacher Upper Body Dressing : Set up;Sitting   Lower Body Dressing: Minimal assistance;Sit to/from stand Lower Body Dressing Details (indicate cue type and reason): pt has reacher and sock aide Toilet Transfer: Min guard;Ambulation;Rolling walker (2 wheels)   Toileting- Clothing Manipulation and Hygiene: Supervision/safety;Sit to/from stand       Functional mobility during ADLs: Min guard;Rolling walker (2 wheels)       Vision Baseline Vision/History: 1 Wears glasses Ability to See in Adequate Light: 0 Adequate Patient Visual Report: No change from baseline       Perception     Praxis  Pertinent Vitals/Pain Pain Assessment Pain Assessment: Faces Faces Pain Scale: Hurts even more Pain Location: R knee and hip Pain Descriptors / Indicators: Discomfort, Operative site guarding, Throbbing Pain Intervention(s): Monitored during  session, Premedicated before session, Ice applied     Hand Dominance Right   Extremity/Trunk Assessment Upper Extremity Assessment Upper Extremity Assessment: Overall WFL for tasks assessed   Lower Extremity Assessment Lower Extremity Assessment: Defer to PT evaluation   Cervical / Trunk Assessment Cervical / Trunk Assessment: Kyphotic;Other exceptions (increased body habitus)   Communication Communication Communication: No difficulties   Cognition Arousal/Alertness: Awake/alert Behavior During Therapy: Flat affect Overall Cognitive Status: No family/caregiver present to determine baseline cognitive functioning                                 General Comments: pt with decreased insight into difficulties and level of assist needed to live alone, using memory strategies to recall when her pain meds are due     General Comments  HR to 120    Exercises     Shoulder Instructions      Home Living Family/patient expects to be discharged to:: Private residence Living Arrangements: Alone Available Help at Discharge: Family;Friend(s);Available PRN/intermittently Type of Home: House Home Access: Stairs to enter [employer] of Steps: 6 Entrance Stairs-Rails: None Home Layout: One level     Bathroom Shower/Tub: Teacher, early years/pre: Standard     Home Equipment: Cane - single point;Rollator (4 wheels);Crutches;Hand held shower head;Adaptive equipment Adaptive Equipment: Reacher;Sock aid;Long-handled sponge Additional Comments: AE from [place] a few years ago      Prior Functioning/Environment Prior Level of Function : Needs assist             Mobility Comments: Using crutches and walker since decemeber ADLs Comments: States she hasnt been able to engage in cleaning and other housework as frequently since december        OT Problem List: Decreased knowledge of use of DME or AE;Decreased safety awareness;Pain;Impaired balance  (sitting and/or standing)      OT Treatment/Interventions: Self-care/ADL training;DME and/or AE instruction;Patient/family education;Balance training;Therapeutic activities    OT Goals(Current goals can be found in the care plan section) Acute Rehab OT Goals OT Goal Formulation: With patient Time For Goal Achievement: 02/24/22 Potential to Achieve Goals: Good ADL Goals Pt Will Perform Grooming: with modified independence;standing Pt Will Transfer to Toilet: with modified independence;ambulating Pt Will Perform Tub/Shower Transfer: Tub transfer;with modified independence;ambulating;shower seat;rolling walker Additional ADL Goal #1: Pt will recall use of AE for LB ADLs.  OT Frequency: Min 2X/week    Co-evaluation              AM-PAC OT "6 Clicks" Daily Activity     Outcome Measure Help from another person eating meals?: None Help from another person taking care of personal grooming?: A Little Help from another person toileting, which includes using toliet, bedpan, or urinal?: A Little Help from another person bathing (including washing, rinsing, drying)?: A Little Help from another person to put on and taking off regular upper body clothing?: None Help from another person to put on and taking off regular lower body clothing?: A Little 6 Click Score: 20   End of Session Equipment Utilized During Treatment: Gait belt;Rolling walker (2 wheels)  Activity Tolerance: Patient tolerated treatment well Patient left: in bed;with call bell/phone within reach;with bed alarm set  OT Visit Diagnosis: Unsteadiness on feet (  R26.81);Other abnormalities of gait and mobility (R26.89);Pain                Time: 1219-7588 OT Time Calculation (min): 30 min Charges:  OT General Charges $OT Visit: 1 Visit OT Evaluation $OT Eval Moderate Complexity: 1 Mod OT Treatments $Self Care/Home Management : 8-22 mins  Kaylee Salas, OTR/L Acute Rehabilitation Services Office: [phone]   Malka So 02/10/2022, 3:33 PM

## 2022-02-10 NOTE — Progress Notes (Signed)
Physical Therapy Treatment Patient Details Name: Kaylee Salas MRN: [id number] DOB: 21-Jun-1966 Today's Date: 02/10/2022   History of Present Illness Pt is 56 y.o. female s/p elective R TKA on 02/08/22. PMH includes anxiety, depression, anemia, L THA (2020).   PT Comments    Pt slowly progressing with mobility; pt self-limiting activity progression, which she attributes to pain, "overdoing it yesterday" and lack of sleep. Pt tolerating transfer training and ambulating ~34' with RW and supervision; declines stair training ("I need to nap"), but agreeable to stairs this afternoon. Multiple attempts to initiate discussion how pt plans to manage at home, and strategies for this, since pt continues to repeat "I have no one" - pt not receptive to discussion ("I can't focus on that right now... I'm hurting too much..."). Will continue efforts to maximize functional mobility and independence prior to return home.    Recommendations for follow up therapy are one component of a multi-disciplinary discharge planning process, led by the attending physician.  Recommendations may be updated based on patient status, additional functional criteria and insurance authorization.  Follow Up Recommendations  Follow physician's recommendations for discharge plan and follow up therapies     Assistance Recommended at Discharge Intermittent Supervision/Assistance  Patient can return home with the following A little help with bathing/dressing/bathroom;Assistance with cooking/housework;Assist for transportation;Help with stairs or ramp for entrance   Equipment Recommendations  Rolling walker (2 wheels)    Recommendations for Other Services  Occupational Therapy     Precautions / Restrictions Precautions Precautions: Knee;Fall Restrictions Weight Bearing Restrictions: Yes RLE Weight Bearing: Weight bearing as tolerated     Mobility  Bed Mobility Overal bed mobility: Modified Independent              General bed mobility comments: increased time, use of BUEs to assist RLE to EOB    Transfers Overall transfer level: Needs assistance Equipment used: Rolling walker (2 wheels) Transfers: Sit to/from Stand Sit to Stand: Supervision           General transfer comment: cues for hand placement as pt pulling on RW despite it starting to tip backwards, supervision for safety; increased time and effort    Ambulation/Gait Ambulation/Gait assistance: Min guard, Supervision Gait Distance (Feet): 34 Feet Assistive device: Rolling walker (2 wheels) Gait Pattern/deviations: Step-to pattern, Decreased weight shift to right, Trunk flexed, Antalgic Gait velocity: decreased     General Gait Details: slow, antalgic gait with RW and initial min guard for balance, progressing to supervision; pt with forward flexed trunk/hips and difficulty achivieving full R knee extension despite cues, pt reports she cannot stand taller due to need for R hip replacement; pt inconsistently receptive to cues for improving gait mechanics; pt declines further distance, reports, "I need to turn around or else I won't make it back"   Stairs Stairs:  (pt declined due to pain and fatigue; reports agreeable this afternoon's session)           Wheelchair Mobility    Modified Rankin (Stroke Patients Only)       Balance Overall balance assessment: Needs assistance Sitting-balance support: No upper extremity supported, Feet supported Sitting balance-Leahy Scale: Good     Standing balance support: Bilateral upper extremity supported, During functional activity Standing balance-Leahy Scale: Fair Standing balance comment: can static stand without UE support; reliant on UE support to ambulate  Cognition Arousal/Alertness: Awake/alert Behavior During Therapy: WFL for tasks assessed/performed, Anxious Overall Cognitive Status: No family/caregiver present to determine baseline  cognitive functioning Area of Impairment: Attention, Memory, Following commands, Safety/judgement, Awareness, Problem solving                   Current Attention Level: Selective Memory: Decreased short-term memory, Decreased recall of precautions Following Commands: Follows one step commands with increased time Safety/Judgement: Decreased awareness of safety, Decreased awareness of deficits Awareness: Emergent Problem Solving: Requires verbal cues General Comments: pt repetitive in conversation. increased time discussing safe plan for d/c home, as pt repeatedly states, "But I have no one" - when asked about things like ascending stairs vs ambulance transport home, pt states, "Don't debate with me" pt later states, "I'm sorry to be rude... I didn't sleep last night." Pt requires frequent redirection from focusing on things she could do yesterday but not today        Exercises Total Joint Exercises Long Arc Quad: AROM, Right, Seated (EOB, able to perform partial range) Knee Flexion: AAROM, Right, Seated (EOB, overpressure/stretch with LLE)    General Comments General comments (skin integrity, edema, etc.): pt argumentative with education attempts and when PT intiating discussion regarding safe d/c home if d/c is today (reeducated this is ultimately up to MD) - pt states she has no help, states she cannot get up her stairs "in this condition" and family member taking her home unable to physically assist (pt reports unusure if family or neighbors can even carry her belongings up stairs into home; encouraged pt to discuss this with family/neighbors). pt declines need for ambulance transport home if an option, reports agreeable to try stairs this afternoon's session. pt repeatedly states she overdid it with PT/therex yesterday and did not sleep well, which is limiting her activity progression. pt requesting to stay sitting EOB with knee flexed; told her this is ok for a few minutes (~20 min)  while performing R knee AROM, but ultimately needing to rest with knee extension      Pertinent Vitals/Pain Pain Assessment Pain Assessment: Faces Faces Pain Scale: Hurts even more Pain Location: R knee Pain Descriptors / Indicators: Discomfort, Operative site guarding Pain Intervention(s): Monitored during session, Limited activity within patient's tolerance    Home Living                          Prior Function            PT Goals (current goals can now be found in the care plan section) Progress towards PT goals: Progressing toward goals (slowly)    Frequency    7X/week      PT Plan Current plan remains appropriate    Co-evaluation              AM-PAC PT "6 Clicks" Mobility   Outcome Measure  Help needed turning from your back to your side while in a flat bed without using bedrails?: None Help needed moving from lying on your back to sitting on the side of a flat bed without using bedrails?: None Help needed moving to and from a bed to a chair (including a wheelchair)?: A Little Help needed standing up from a chair using your arms (e.g., wheelchair or bedside chair)?: A Little Help needed to walk in hospital room?: A Little Help needed climbing 3-5 steps with a railing? : A Lot 6 Click Score: 19    End of Session  Equipment Utilized During Treatment: Gait belt Activity Tolerance: Patient limited by pain Patient left: in bed;with call bell/phone within reach;with bed alarm set (declines sitting in recliner "because I was left in it for 1.5 hrs yesterday") Nurse Communication: Mobility status PT Visit Diagnosis: Other abnormalities of gait and mobility (R26.89);Muscle weakness (generalized) (M62.81);Difficulty in walking, not elsewhere classified (R26.2);Pain Pain - Right/Left: Right Pain - part of body: Knee     Time: 2902-1115 PT Time Calculation (min) (ACUTE ONLY): 24 min  Charges:  $Gait Training: 8-22 mins $Self Care/Home Management:  [place], PT, DPT Acute Rehabilitation Services  Pager [phone] Office [place] 02/10/2022, 10:38 AM

## 2022-02-10 NOTE — Plan of Care (Signed)
  Problem: Activity: Goal: Risk for activity intolerance will decrease Outcome: Progressing   Problem: Nutrition: Goal: Adequate nutrition will be maintained Outcome: Progressing   Problem: Safety: Goal: Ability to remain free from injury will improve Outcome: Progressing   

## 2022-02-10 NOTE — TOC Initial Note (Signed)
Transition of Care [facility]) - Initial/Assessment Note    Patient Details  Name: Kaylee Salas MRN: [id number] Date of Birth: Feb 05, 1966  Transition of [facility]) CM/SW Contact:    Tom-Johnson, Renea Ee, RN Phone Number: 02/10/2022, 4:46 PM  Clinical Narrative:                  CM spoke with patient about needs for post hospital transition. Patient is admitted for Rt Total Knee Arthroplasty.  Patient states she lost her home and Job during Covid. Lives in her car. States she will be going to either her boyfriend or mother's house. CM asked patient if she could give permission to speak with them for verification for a safe discharge and patient declined. RW and BSC recommended by PT/OT, patient states she has both DME's at home.  CM called in home health referral to [facility] and Marjory Lies states they are following patient for discharge. Info on AVS.  PCP is Emelda Fear, DO [facility] [place] [place].  CM will continue to follow with needs.   Expected Discharge Plan: [place] Barriers to Discharge: Continued Medical Work up   Patient Goals and CMS Choice Patient states their goals for this hospitalization and ongoing recovery are:: To return home CMS Medicare.gov Compare Post Acute Care list provided to:: Patient Choice offered to / list presented to : Patient  Expected Discharge Plan and Services Expected Discharge Plan: [place]   Discharge Planning Services: CM Consult Post Acute Care Choice: [place] arrangements for the past 2 months: Single Family Home                 DME Arranged: Other see comment (Has DME's at home) DME Agency: NA       HH Arranged: PT HH Agency: [place] Date [place]: 02/10/22 Time [place]: 1520 Representative spoke with at [place]: Marjory Lies  Prior Living Arrangements/Services Living arrangements for the past 2 months: [place] with:: Other (Comment) Patient language and need for interpreter reviewed:: Yes Do you feel safe going back to the place where you live?: Yes      Need for Family Participation in Patient Care: Yes (Comment) Care giver support system in place?: Yes (comment) Current home services: DME Criminal Activity/Legal Involvement Pertinent to Current Situation/Hospitalization: No - Comment as needed  Activities of Daily Living      Permission Sought/Granted Permission sought to share information with : Case Manager, Family Supports Permission granted to share information with : Yes, Verbal Permission Granted              Emotional Assessment Appearance:: Appears stated age Attitude/Demeanor/Rapport: Engaged, Gracious Affect (typically observed): Accepting, Appropriate, Calm, Hopeful Orientation: : Oriented to Self, Oriented to Place, Oriented to  Time, Oriented to Situation Alcohol / Substance Use: Not Applicable Psych Involvement: No (comment)  Admission diagnosis:  OA (osteoarthritis) of knee [M17.9] Status post total right knee replacement [Z96.651] Patient Active Problem List   Diagnosis Date Noted   OA (osteoarthritis) of knee 02/08/2022   Status post total right knee replacement 02/08/2022   Abnormal thyroid blood test 10/15/2021   Unilateral primary osteoarthritis, right hip 03/24/2021   Unilateral primary osteoarthritis, right knee 05/27/2020   Unilateral primary osteoarthritis, left hip 10/30/2018   Status post total replacement of left hip 10/30/2018   PCP:  Emelda Fear, DO [id number] [place] [address] Phone: [phone] Fax: [phone]     Social Determinants of Health (SDOH) Interventions    Readmission Risk Interventions     No data to display

## 2022-02-11 MED ORDER — HYDROMORPHONE HCL 4 MG PO TABS
4.0000 mg | ORAL_TABLET | ORAL | 0 refills | Status: DC | PRN
Start: 2022-02-11 — End: 2022-02-14

## 2022-02-11 MED ORDER — TIZANIDINE HCL 4 MG PO TABS: 4.0000 mg | ORAL_TABLET | Freq: Three times a day (TID) | ORAL | 0 refills | Status: DC | PRN

## 2022-02-11 MED ORDER — ASPIRIN 81 MG PO CHEW: 81.0000 mg | CHEWABLE_TABLET | Freq: Two times a day (BID) | ORAL | 0 refills | Status: DC

## 2022-02-11 NOTE — Progress Notes (Signed)
Patient ID: Kaylee Salas, female   DOB: Jan 03, 1966, 56 y.o.   MRN: [id number] No acute changes.  Right operative knee is stable.  Vitals stable.  Will attempt to discharge to home this afternoon.

## 2022-02-11 NOTE — TOC Transition Note (Signed)
Transition of Care [facility]) - CM/SW Discharge Note   Patient Details  Name: Kaylee Salas MRN: [id number] Date of Birth: [birth year]  Transition of Care [facility]) CM/SW Contact:  Tom-Johnson, Renea Ee, RN Phone Number: 02/11/2022, 12:48 PM   Clinical Narrative:     Patient is scheduled for discharge today. Home health with [facility] and info on AVS. Sister in-law to transport at discharge. No further TOC needs noted.   Final next level of care: [place] Barriers to Discharge: Barriers Resolved   Patient Goals and CMS Choice Patient states their goals for this hospitalization and ongoing recovery are:: To return home CMS Medicare.gov Compare Post Acute Care list provided to:: Patient Choice offered to / list presented to : Patient  Discharge Placement                Patient to be transferred to facility by: Sister in-law      Discharge Plan and Services   Discharge Planning Services: CM Consult Post Acute Care Choice: Home Health          DME Arranged: Other see comment (Has DME's at home) DME Agency: NA       HH Arranged: PT HH Agency: [place] Date [place]: 02/10/22 Time [place]: 1520 Representative spoke with at [place]: [place] (Cut Off) Interventions     Readmission Risk Interventions     No data to display

## 2022-02-11 NOTE — Discharge Summary (Signed)
Patient ID: Kaylee Salas MRN: [id number] DOB/AGE: [birth year] 56 y.o.  Admit date: 02/08/2022 Discharge date: 02/11/2022  Admission Diagnoses:  Principal Problem:   Unilateral primary osteoarthritis, right knee Active Problems:   OA (osteoarthritis) of knee   Status post total right knee replacement   Discharge Diagnoses:  Same  Past Medical History:  Diagnosis Date   Anemia    Anxiety    Arthritis    Complication of anesthesia    "woke up panicking/fighting"   Depression    Osteoporosis    PONV (postoperative nausea and vomiting)    Unilateral primary osteoarthritis, left hip 10/30/2018    Surgeries: Procedure(s): RIGHT TOTAL KNEE ARTHROPLASTY on 02/08/2022   Consultants:   Discharged Condition: Improved  Hospital Course: Kaylee Salas is an 56 y.o. female who was admitted 02/08/2022 for operative treatment ofUnilateral primary osteoarthritis, right knee. Patient has severe unremitting pain that affects sleep, daily activities, and work/hobbies. After pre-op clearance the patient was taken to the operating room on 02/08/2022 and underwent  Procedure(s): RIGHT TOTAL KNEE ARTHROPLASTY.    Patient was given perioperative antibiotics:  Anti-infectives (From admission, onward)    Start     Dose/Rate Route Frequency Ordered Stop   02/09/22 0900  fluconazole (DIFLUCAN) tablet 150 mg        150 mg Oral  Once 02/09/22 0754 02/09/22 1029   02/08/22 1700  vancomycin (VANCOCIN) IVPB 1000 mg/200 mL premix        1,000 mg 200 mL/hr over 60 Minutes Intravenous Every 12 hours 02/08/22 1648 02/09/22 0305   02/08/22 1200  vancomycin (VANCOCIN) IVPB 1000 mg/200 mL premix        1,000 mg 200 mL/hr over 60 Minutes Intravenous  Once 02/08/22 1155 02/08/22 1409   02/08/22 1200  ceFAZolin (ANCEF) IVPB 2g/100 mL premix        2 g 200 mL/hr over 30 Minutes Intravenous On call to O.R. 02/08/22 1155 02/08/22 1445        Patient was given sequential compression devices, early ambulation,  and chemoprophylaxis to prevent DVT.  Patient benefited maximally from hospital stay and there were no complications.    Recent vital signs: Patient Vitals for the past 24 hrs:  BP Temp Temp src Pulse Resp SpO2  02/11/22 0814 (!) 101/53 98.2 F (36.8 C) Oral (!) 105 -- 96 %  02/11/22 0443 (!) 111/56 98.1 F (36.7 C) Oral 89 16 97 %  02/10/22 2020 114/64 98.3 F (36.8 C) Oral (!) 105 18 --     Recent laboratory studies:  Recent Labs    02/09/22 0202  WBC 10.2  HGB 9.1*  HCT 29.0*  PLT 252  NA 141  K 3.8  CL 112*  CO2 26  BUN 9  CREATININE 0.66  GLUCOSE 125*  CALCIUM 8.0*     Discharge Medications:   Allergies as of 02/11/2022   No Known Allergies      Medication List     STOP taking these medications    acetaminophen-codeine 300-60 MG tablet Commonly known as: TYLENOL #4       TAKE these medications    aspirin 81 MG chewable tablet Chew 1 tablet (81 mg total) by mouth 2 (two) times daily.   clonazePAM 2 MG tablet Commonly known as: KLONOPIN Take 2 mg by mouth 2 (two) times daily as needed for anxiety.   DULoxetine 60 MG capsule Commonly known as: CYMBALTA Take 60 mg by mouth daily.   DULoxetine 20 MG capsule  Commonly known as: CYMBALTA Take 20 mg by mouth daily.   GOODY HEADACHE PO Take 1 Package by mouth daily as needed (headache).   HYDROmorphone 4 MG tablet Commonly known as: DILAUDID Take 1 tablet (4 mg total) by mouth every 4 (four) hours as needed for severe pain (pain score 7-10).   hydrOXYzine 25 MG tablet Commonly known as: ATARAX Take 25 mg by mouth every evening.   meloxicam 15 MG tablet Commonly known as: MOBIC TAKE 1 TABLET BY MOUTH EVERY DAY AS NEEDED FOR PAIN What changed: See the new instructions.   methocarbamol 750 MG tablet Commonly known as: ROBAXIN methocarbamol 750 mg tablet  TAKE 1 TABLET BY MOUTH TWICE A DAY AS NEEDED   ondansetron 4 MG disintegrating tablet Commonly known as: ZOFRAN-ODT Take 4 mg by mouth  every 8 (eight) hours as needed for vomiting or nausea.   polyethylene glycol powder 17 GM/SCOOP powder Commonly known as: GLYCOLAX/MIRALAX Take 17 g by mouth daily.   rOPINIRole 1 MG tablet Commonly known as: REQUIP Take 1 mg by mouth 4 (four) times daily as needed (restless leg).   sertraline 100 MG tablet Commonly known as: ZOLOFT Take 100 mg by mouth daily.   sucralfate 1 g tablet Commonly known as: CARAFATE Take 1 g by mouth 4 (four) times daily -  with meals and at bedtime.   tiZANidine 4 MG tablet Commonly known as: ZANAFLEX Take 1 tablet (4 mg total) by mouth every 8 (eight) hours as needed for muscle spasms.               Durable Medical Equipment  (From admission, onward)           Start     Ordered   02/08/22 2129  DME 3 n 1  Once        02/08/22 2129   02/08/22 2129  DME Walker rolling  Once       Question Answer Comment  Walker: With 5 Inch Wheels   Patient needs a walker to treat with the following condition Status post total right knee replacement      02/08/22 2129            Diagnostic Studies: DG Knee Right Port  Result Date: 02/08/2022 CLINICAL DATA:  Postop for knee arthroplasty EXAM: PORTABLE RIGHT KNEE - 1-2 VIEW COMPARISON:  12/01/2021, without report FINDINGS: Status post right knee arthroplasty with overlying surgical staples and anterior edema/gas. Posterior intra-articular loose bodies. IMPRESSION: Expected appearance after right knee arthroplasty. Electronically Signed   By: Abigail Miyamoto M.D.   On: 02/08/2022 20:16    Disposition: Discharge disposition: 01-Home or [place]     Kaylee Rossetti, MD Follow up in 2 week(s).   Specialty: Orthopedic Surgery Contact information: [place] [address] [place], [place] Follow up.   Specialty: Home Health Services Why: Someone will call you to schedule your first home visit. Contact  information: [address] [place] [address] [phone]                  Signed: Mcarthur Salas 02/11/2022, 4:37 PM

## 2022-02-11 NOTE — Progress Notes (Signed)
Physical Therapy Treatment Patient Details Name: Kaylee Salas MRN: [id number] DOB: November 08, 1965 Today's Date: 02/11/2022   History of Present Illness Pt is 56 y.o. female s/p elective R TKA on 02/08/22. PMH includes anxiety, depression, anemia, L THA (2020).    PT Comments    Pt was seen for mobility and declines gait, but earlier when PT had checked on her was up walking independently to BR.  Pt was assisted to do ROM on RLE and then AROM on LLE to review her handout.  Pt is concerned about overtiring herself when she is going home and will need to manage independently.  Follow up with her to work on mobility as she will agree, but pt does report she has been on crutches at home to manage steps before this trip to hosp.  Has practiced crutches yesterday as well.   Follow up to do as much moving as pt permits.   Recommendations for follow up therapy are one component of a multi-disciplinary discharge planning process, led by the attending physician.  Recommendations may be updated based on patient status, additional functional criteria and insurance authorization.  Follow Up Recommendations  Follow physician's recommendations for discharge plan and follow up therapies     Assistance Recommended at Discharge Intermittent Supervision/Assistance  Patient can return home with the following A little help with walking and/or transfers;A little help with bathing/dressing/bathroom;Assistance with cooking/housework;Assist for transportation;Help with stairs or ramp for entrance   Equipment Recommendations  Rolling walker (2 wheels)    Recommendations for Other Services       Precautions / Restrictions Precautions Precautions: Knee;Fall Precaution Booklet Issued: Yes (comment) Restrictions Weight Bearing Restrictions: Yes RLE Weight Bearing: Weight bearing as tolerated     Mobility  Bed Mobility Overal bed mobility: Modified Independent                  Transfers                    General transfer comment: declined OOB but was up alone in BR earlier in the AM    Ambulation/Gait               General Gait Details: pt declined to walk as she has been up walking alone earlier and wants to save her energy   Stairs             Wheelchair Mobility    Modified Rankin (Stroke Patients Only)       Balance                                            Cognition Arousal/Alertness: Awake/alert Behavior During Therapy: Flat affect Overall Cognitive Status: No family/caregiver present to determine baseline cognitive functioning Area of Impairment: Safety/judgement, Following commands, Attention, Orientation                 Orientation Level: Time Current Attention Level: Selective Memory: Decreased recall of precautions, Decreased short-term memory Following Commands: Follows one step commands with increased time Safety/Judgement: Decreased awareness of deficits, Decreased awareness of safety   Problem Solving: Slow processing, Requires verbal cues          Exercises Total Joint Exercises Ankle Circles/Pumps: PROM, AROM, 5 reps Quad Sets: AROM, 10 reps Gluteal Sets: AROM, 10 reps Heel Slides: AROM, AAROM, 10 reps Hip ABduction/ADduction: AROM, AAROM, 10 reps  General Comments General comments (skin integrity, edema, etc.): pt was assisted to move RLE and actively LLE, in place of gait and transfers.  Has been having pain on RLE that is relieved with ROM      Pertinent Vitals/Pain Pain Assessment Pain Assessment: Faces Faces Pain Scale: Hurts little more Pain Location: R knee and hip Pain Descriptors / Indicators: Operative site guarding, Grimacing Pain Intervention(s): Limited activity within patient's tolerance, Monitored during session, Premedicated before session, Repositioned, Ice applied    Home Living                          Prior Function            PT Goals (current goals can  now be found in the care plan section) Acute Rehab PT Goals Patient Stated Goal: to return to work Progress towards PT goals: Progressing toward goals    Frequency    7X/week      PT Plan Current plan remains appropriate    Co-evaluation              AM-PAC PT "6 Clicks" Mobility   Outcome Measure  Help needed turning from your back to your side while in a flat bed without using bedrails?: None Help needed moving from lying on your back to sitting on the side of a flat bed without using bedrails?: A Little Help needed moving to and from a bed to a chair (including a wheelchair)?: A Little Help needed standing up from a chair using your arms (e.g., wheelchair or bedside chair)?: A Little Help needed to walk in hospital room?: A Little Help needed climbing 3-5 steps with a railing? : A Little 6 Click Score: 19    End of Session Equipment Utilized During Treatment: Gait belt Activity Tolerance: Patient limited by pain Patient left: in bed;with call bell/phone within reach;with bed alarm set Nurse Communication: Mobility status PT Visit Diagnosis: Other abnormalities of gait and mobility (R26.89);Muscle weakness (generalized) (M62.81);Difficulty in walking, not elsewhere classified (R26.2);Pain Pain - Right/Left: Right Pain - part of body: Knee     Time: 1222-1246 PT Time Calculation (min) (ACUTE ONLY): 24 min  Charges:  $Therapeutic Exercise: 23-37 mins      Ramond Dial 02/11/2022, 4:51 PM  Mee Hives, PT PhD Acute Rehab Dept. Number: [place] and [place]

## 2022-02-14 ENCOUNTER — Telehealth: Payer: Self-pay | Admitting: Orthopaedic Surgery

## 2022-02-14 ENCOUNTER — Other Ambulatory Visit: Payer: Self-pay | Admitting: Orthopaedic Surgery

## 2022-02-14 MED ORDER — HYDROMORPHONE HCL 4 MG PO TABS: 4.0000 mg | ORAL_TABLET | ORAL | 0 refills | Status: DC | PRN

## 2022-02-14 NOTE — Telephone Encounter (Signed)
Please advise 

## 2022-02-14 NOTE — Telephone Encounter (Signed)
Pt would like refill on pain meds

## 2022-02-14 NOTE — Telephone Encounter (Signed)
Pt called back to inform Dr. Ninfa Linden she states that the pain is taking her breath away. Pt is asking for an immediate call back. Please call pt at [phone].

## 2022-02-16 ENCOUNTER — Telehealth: Payer: Self-pay | Admitting: Orthopaedic Surgery

## 2022-02-16 NOTE — Telephone Encounter (Signed)
Annie Main with [facility] home [facility] called and states patient is having a high level of pain. Also her pulse was a little high this morning but motion looks good.(Which is why he thinks she is in pain) He just wants to make sure the doctor knows about pts pain.   CB .No results found for: "HIV1RNAQUANT" [phone]

## 2022-02-18 ENCOUNTER — Other Ambulatory Visit: Payer: Self-pay | Admitting: Orthopaedic Surgery

## 2022-02-18 ENCOUNTER — Telehealth: Payer: Self-pay | Admitting: Orthopaedic Surgery

## 2022-02-18 MED ORDER — HYDROMORPHONE HCL 4 MG PO TABS: 4.0000 mg | ORAL_TABLET | ORAL | 0 refills | Status: DC | PRN

## 2022-02-18 NOTE — Telephone Encounter (Signed)
Pt called requesting a refill of pain medication. Please send to pharmacy on file. Pt phone number is [phone]

## 2022-02-19 ENCOUNTER — Other Ambulatory Visit: Payer: Self-pay | Admitting: Orthopaedic Surgery

## 2022-02-21 ENCOUNTER — Ambulatory Visit (INDEPENDENT_AMBULATORY_CARE_PROVIDER_SITE_OTHER): Payer: 59 | Admitting: Orthopaedic Surgery

## 2022-02-21 ENCOUNTER — Encounter: Payer: Self-pay | Admitting: Orthopaedic Surgery

## 2022-02-21 DIAGNOSIS — Z96651 Presence of right artificial knee joint: Secondary | ICD-10-CM

## 2022-02-21 NOTE — Progress Notes (Addendum)
The patient comes in today at 2 weeks status post a right total knee arthroplasty.  She has been pushing herself hard to home therapy.  Her incision looks great staples have been removed and Steri-Strips applied.  Her extension is almost full and her flexion is to past 90 degrees.  She is still dealing with debilitating arthritis of her right hip and we did replace her left hip several years ago.  She is ambulate with crutches.  She will need to be out of work for likely for the remainder of the year as she recovers from her knee replaced on the right side and then we have to proceed with a right hip replacement at some point.  I am very pleased overall with her progress and she at this point can transition outpatient physical therapy.  We will see her back in 4 weeks to see how she is doing overall but no x-rays are needed.  When she does run out of pain medication we can refill this.

## 2022-02-22 ENCOUNTER — Other Ambulatory Visit: Payer: Self-pay | Admitting: Orthopaedic Surgery

## 2022-02-22 ENCOUNTER — Telehealth: Payer: Self-pay | Admitting: Orthopaedic Surgery

## 2022-02-22 MED ORDER — HYDROMORPHONE HCL 4 MG PO TABS: 4.0000 mg | ORAL_TABLET | ORAL | 0 refills | Status: DC | PRN

## 2022-02-22 NOTE — Telephone Encounter (Signed)
1.I called pt and she stated she twisted and heard pop. Now having pain from foot to hip. Pain on lateral side of knee. Pain is getting better. Her ROM has decreased since this. She wants to know if she should worried  2.Pt also stated she doesn't have a job to go back to. She is trying to file for long term disability with the government. Doesn't feel like she will be able to return to work full time. She only feels she can return to work part time But she doesn't think she can do this until after she gets all her replacements done. She needs this reflected in her notes  Please advise

## 2022-02-24 ENCOUNTER — Ambulatory Visit (INDEPENDENT_AMBULATORY_CARE_PROVIDER_SITE_OTHER): Payer: 59 | Admitting: Orthopaedic Surgery

## 2022-02-24 ENCOUNTER — Ambulatory Visit (INDEPENDENT_AMBULATORY_CARE_PROVIDER_SITE_OTHER): Payer: 59

## 2022-02-24 DIAGNOSIS — Z96651 Presence of right artificial knee joint: Secondary | ICD-10-CM | POA: Diagnosis not present

## 2022-02-24 NOTE — Progress Notes (Signed)
The patient is over 2 weeks out from a right total knee arthroplasty.  I just saw her on Monday of this week.  She then felt something pop in her knee and was concerned and appropriately came in for follow-up.  She has extension and flexion intact in terms of those mechanisms with her right operative knee.  Her calf is soft.  She has no foot drop.  She has almost full extension to just past 90 degrees flexion.  I did not see any worrisome or acute findings based on what she felt with her knee.  I did obtain x-rays today of her right knee and it shows a well-seated total knee arthroplasty with no complicating features.  She understands that it is essential now that she get into outpatient physical therapy to get her knee bending and moving.  She does have known osteoarthritis of her right hip and will eventually need a right hip replacement.  We replaced her left hip.  Once again, she will likely be unable to return to any type of work for the remainder of this year until she is able to better fully function and recover from the knee replacement and eventually a right hip replacement.  We will see her back in 4 weeks to see how she is doing overall but no x-rays are needed.

## 2022-02-25 ENCOUNTER — Telehealth: Payer: Self-pay | Admitting: Orthopaedic Surgery

## 2022-02-25 ENCOUNTER — Other Ambulatory Visit: Payer: Self-pay | Admitting: Orthopaedic Surgery

## 2022-02-25 MED ORDER — HYDROMORPHONE HCL 4 MG PO TABS
4.0000 mg | ORAL_TABLET | ORAL | 0 refills | Status: DC | PRN
Start: 2022-02-25 — End: 2022-03-07

## 2022-02-25 NOTE — Telephone Encounter (Signed)
Please advise 

## 2022-02-25 NOTE — Telephone Encounter (Signed)
Patient called she would like a refill on her pain medication. Call back number is [phone]

## 2022-02-25 NOTE — Telephone Encounter (Signed)
Called pt and advised. She stated understanding  

## 2022-02-28 ENCOUNTER — Other Ambulatory Visit: Payer: Self-pay

## 2022-02-28 ENCOUNTER — Encounter: Payer: Self-pay | Admitting: Orthopaedic Surgery

## 2022-02-28 DIAGNOSIS — Z96651 Presence of right artificial knee joint: Secondary | ICD-10-CM

## 2022-02-28 NOTE — Telephone Encounter (Signed)
Ok to order 

## 2022-03-01 ENCOUNTER — Other Ambulatory Visit: Payer: Self-pay | Admitting: Orthopaedic Surgery

## 2022-03-02 NOTE — Telephone Encounter (Signed)
Referral cancelled. 

## 2022-03-03 ENCOUNTER — Other Ambulatory Visit: Payer: Self-pay | Admitting: Orthopaedic Surgery

## 2022-03-07 ENCOUNTER — Other Ambulatory Visit: Payer: Self-pay | Admitting: Orthopaedic Surgery

## 2022-03-07 ENCOUNTER — Telehealth: Payer: Self-pay | Admitting: Orthopaedic Surgery

## 2022-03-07 MED ORDER — HYDROMORPHONE HCL 4 MG PO TABS: 4.0000 mg | ORAL_TABLET | Freq: Four times a day (QID) | ORAL | 0 refills | Status: DC | PRN

## 2022-03-07 NOTE — Telephone Encounter (Signed)
Please advise 

## 2022-03-07 NOTE — Telephone Encounter (Signed)
Pt called requesting pain medication. Please send to pharmacy on file. Pt phone number is [phone]

## 2022-03-14 ENCOUNTER — Telehealth: Payer: Self-pay | Admitting: Orthopaedic Surgery

## 2022-03-14 ENCOUNTER — Other Ambulatory Visit: Payer: Self-pay | Admitting: Orthopaedic Surgery

## 2022-03-14 ENCOUNTER — Other Ambulatory Visit: Payer: Self-pay | Admitting: Physician Assistant

## 2022-03-14 MED ORDER — HYDROMORPHONE HCL 4 MG PO TABS: 4.0000 mg | ORAL_TABLET | Freq: Four times a day (QID) | ORAL | 0 refills | Status: DC | PRN

## 2022-03-14 NOTE — Telephone Encounter (Signed)
Please advise 

## 2022-03-14 NOTE — Telephone Encounter (Signed)
Pt called requesting a refill of pain medication. Please send to pharmacy on file. Pt phone number is [phone]

## 2022-03-21 ENCOUNTER — Encounter: Payer: Self-pay | Admitting: Orthopaedic Surgery

## 2022-03-21 ENCOUNTER — Ambulatory Visit (INDEPENDENT_AMBULATORY_CARE_PROVIDER_SITE_OTHER): Payer: 59 | Admitting: Orthopaedic Surgery

## 2022-03-21 DIAGNOSIS — Z96651 Presence of right artificial knee joint: Secondary | ICD-10-CM

## 2022-03-21 MED ORDER — HYDROCODONE-ACETAMINOPHEN 7.5-325 MG PO TABS: 1.0000 | ORAL_TABLET | Freq: Four times a day (QID) | ORAL | 0 refills | Status: DC | PRN

## 2022-03-21 NOTE — Progress Notes (Signed)
The patient is now 6 weeks status post a right total knee arthroplasty.  The right knee is really not bothering her the way her right hip bothers her given the known arthritis in her right hip.  On exam her extension is almost full the right knee and her flexion is almost full.  She is really push herself hard.  Surprising there is a lot of swelling with the right knee and her incision looks great.  We are going to transition her now to hydrocodone from Dilaudid.  I encouraged her to keep doing what she is doing in terms of her mobility.  We will see her back in 4 weeks to see how she is doing overall but no x-rays are needed.  At that point we can consider discussing her right hip in terms of treatment for the osteoarthritis of the right hip.  Of note she has had a left hip replacement before.

## 2022-03-28 ENCOUNTER — Telehealth: Payer: Self-pay | Admitting: Orthopaedic Surgery

## 2022-03-28 ENCOUNTER — Other Ambulatory Visit: Payer: Self-pay | Admitting: Orthopaedic Surgery

## 2022-03-28 MED ORDER — HYDROCODONE-ACETAMINOPHEN 7.5-325 MG PO TABS: 1.0000 | ORAL_TABLET | Freq: Four times a day (QID) | ORAL | 0 refills | Status: DC | PRN

## 2022-03-28 NOTE — Telephone Encounter (Signed)
Patient called needing Rx refilled for Hydrocodone. The number to contact patient is [phone]

## 2022-03-29 ENCOUNTER — Encounter: Payer: Self-pay | Admitting: Physical Medicine & Rehabilitation

## 2022-03-29 ENCOUNTER — Encounter: Payer: 59 | Attending: Physical Medicine & Rehabilitation | Admitting: Physical Medicine & Rehabilitation

## 2022-03-29 VITALS — BP 126/87 | HR 92 | Ht 62.0 in | Wt 173.0 lb

## 2022-03-29 DIAGNOSIS — M533 Sacrococcygeal disorders, not elsewhere classified: Secondary | ICD-10-CM

## 2022-03-29 NOTE — Progress Notes (Signed)
Subjective:    Patient ID: Kaylee Salas, female    DOB: 1965-08-30, 56 y.o.   MRN: [id number]  HPI  Right buttocks. Left buttocks pain  Pain is described as severe and chronic, 8/10 for 20+ years Patient has done very well after right total knee replacement performed 02/08/2022.  Still using crutches mainly due to low back and buttock pain.  Pain impedes ADLs and not respond to even hydromorphone  Had a 50% relief to Right L5 DR, S1,2,3 lateral branch blocks performed by Dr Ernestina Patches 08/10/21  50% relief with Left L5 DR, S1,2,3 lateral branch blocks performed 12/07/21 by myself   Per patient orthopedics has requested further pain relieving procedures to help with her low back pain. Pain Inventory Average Pain 8 Pain Right Now 8 My pain is constant, sharp, burning, dull, stabbing, and tingling  In the last 24 hours, has pain interfered with the following? General activity 8 Relation with others 8 Enjoyment of life 10 What TIME of day is your pain at its worst? morning , daytime, evening, and night Sleep (in general) Poor  Pain is worse with: walking, bending, sitting, inactivity, standing, and some activites Pain improves with: medication and injections Relief from Meds: 6  Family History  Problem Relation Age of Onset   Hypertension Mother    Hyperlipidemia Mother    Heart attack Mother    Heart attack Father    Hyperlipidemia Father    Hypertension Father    Heart failure Father    Social History   Socioeconomic History   Marital status: Single    Spouse name: Not on file   Number of children: Not on file   Years of education: Not on file   Highest education level: Not on file  Occupational History   Not on file  Tobacco Use   Smoking status: Never   Smokeless tobacco: Never  Vaping Use   Vaping Use: Never used  Substance and Sexual Activity   Alcohol use: Yes    Comment: occassionally   Drug use: Never   Sexual activity: Not on file  Other Topics  Concern   Not on file  Social History Narrative   Not on file   Social Determinants of Health   Financial Resource Strain: Not on file  Food Insecurity: Not on file  Transportation Needs: Not on file  Physical Activity: Not on file  Stress: Not on file  Social Connections: Not on file   Past Surgical History:  Procedure Laterality Date   CHOLECYSTECTOMY  2004   GASTRIC BYPASS     HAND SURGERY Right    ring finger reconstruction   HERNIA REPAIR     KNEE ARTHROSCOPY W/ ACL RECONSTRUCTION Right 1997   TOTAL HIP ARTHROPLASTY Left 10/30/2018   Procedure: LEFT TOTAL HIP ARTHROPLASTY ANTERIOR APPROACH;  Surgeon: Mcarthur Rossetti, MD;  Location: [facility];  Service: Orthopedics;  Laterality: Left;   TOTAL KNEE ARTHROPLASTY Right 02/08/2022   Procedure: RIGHT TOTAL KNEE ARTHROPLASTY;  Surgeon: Mcarthur Rossetti, MD;  Location: [facility];  Service: Orthopedics;  Laterality: Right;   UMBILICAL HERNIA REPAIR  2004   Past Surgical History:  Procedure Laterality Date   CHOLECYSTECTOMY  2004   GASTRIC BYPASS     HAND SURGERY Right    ring finger reconstruction   HERNIA REPAIR     KNEE ARTHROSCOPY W/ ACL RECONSTRUCTION Right 1997   TOTAL HIP ARTHROPLASTY Left 10/30/2018   Procedure: LEFT TOTAL HIP ARTHROPLASTY ANTERIOR APPROACH;  Surgeon: Mcarthur Rossetti, MD;  Location: [facility];  Service: Orthopedics;  Laterality: Left;   TOTAL KNEE ARTHROPLASTY Right 02/08/2022   Procedure: RIGHT TOTAL KNEE ARTHROPLASTY;  Surgeon: Mcarthur Rossetti, MD;  Location: [facility];  Service: Orthopedics;  Laterality: Right;   UMBILICAL HERNIA REPAIR  2004   Past Medical History:  Diagnosis Date   Anemia    Anxiety    Arthritis    Complication of anesthesia    "woke up panicking/fighting"   Depression    Osteoporosis    PONV (postoperative nausea and vomiting)    Unilateral primary osteoarthritis, left hip 10/30/2018   There were no vitals taken for this visit.  Opioid Risk Score:   Fall Risk  Score:  `1  Depression screen PHQ 2/9     12/07/2021    2:10 PM 05/18/2021    1:02 PM  Depression screen PHQ 2/9  Decreased Interest 0   Down, Depressed, Hopeless 0 0  PHQ - 2 Score 0 0    Review of Systems  Musculoskeletal:  Positive for back pain and gait problem.       Pain in both shoulders, both knees, both feet, right hand, and buttocks  All other systems reviewed and are negative.      Objective:   Physical Exam Vitals reviewed.  Constitutional:      Appearance: She is obese.  HENT:     Head: Normocephalic and atraumatic.  Eyes:     Extraocular Movements: Extraocular movements intact.     Conjunctiva/sclera: Conjunctivae normal.     Pupils: Pupils are equal, round, and reactive to light.  Musculoskeletal:     Comments: Gaenslens: unable to do due to hip pain Sacral thrust (prone) :  + bilateral  Lateral compression:  Pos lateral pain FABER's: + right groin Distraction (supine): + R>L groin  Thigh thrust test: + Right buttocks   Neurological:     Mental Status: She is alert.     Comments: Motor strength is flexor knee extensor ankle dorsiflexor. Negative straight leg raising Sensation intact in lower extremities  Psychiatric:        Mood and Affect: Mood normal.        Behavior: Behavior normal.           Assessment & Plan:   Chronic back and hip pain.  Her groin pain is due to hip OA however the low back pain is most likely related to her sacroiliac pain which has been relieved on a short-term basis with sacroiliac nerve blocks performed on the right side by Dr. Ernestina Patches in December 2022 and on the left side by myself in April 2023.  She had a 50% reduction of pain. We discussed that she would be a good candidate for sacroiliac RF.  Given greater intensity of pain on the right side we will order right sided procedure first but will likely require left sacroiliac RFA at a subsequent visit. In terms of pain medications she is still under the care of Dr.  Ninfa Linden from orthopedics who is prescribing hydrocodone.

## 2022-04-01 ENCOUNTER — Encounter: Payer: Self-pay | Admitting: Orthopaedic Surgery

## 2022-04-01 ENCOUNTER — Other Ambulatory Visit: Payer: Self-pay | Admitting: Orthopaedic Surgery

## 2022-04-01 MED ORDER — TIZANIDINE HCL 4 MG PO TABS
4.0000 mg | ORAL_TABLET | Freq: Three times a day (TID) | ORAL | 0 refills | Status: DC | PRN
Start: 2022-04-01 — End: 2022-04-11

## 2022-04-01 MED ORDER — HYDROCODONE-ACETAMINOPHEN 7.5-325 MG PO TABS: 1.0000 | ORAL_TABLET | Freq: Four times a day (QID) | ORAL | 0 refills | Status: DC | PRN

## 2022-04-11 ENCOUNTER — Other Ambulatory Visit: Payer: Self-pay | Admitting: Orthopaedic Surgery

## 2022-04-11 ENCOUNTER — Other Ambulatory Visit: Payer: Self-pay | Admitting: Physician Assistant

## 2022-04-11 ENCOUNTER — Telehealth: Payer: Self-pay | Admitting: Orthopaedic Surgery

## 2022-04-11 MED ORDER — TIZANIDINE HCL 4 MG PO TABS: 4.0000 mg | ORAL_TABLET | Freq: Three times a day (TID) | ORAL | 0 refills | Status: DC | PRN

## 2022-04-11 MED ORDER — HYDROCODONE-ACETAMINOPHEN 7.5-325 MG PO TABS: 1.0000 | ORAL_TABLET | Freq: Four times a day (QID) | ORAL | 0 refills | Status: DC | PRN

## 2022-04-11 NOTE — Telephone Encounter (Signed)
Pt called and needs refill on zanaflex and hydrocodone.

## 2022-04-19 ENCOUNTER — Other Ambulatory Visit: Payer: Self-pay | Admitting: Physician Assistant

## 2022-04-19 ENCOUNTER — Telehealth: Payer: Self-pay | Admitting: Orthopaedic Surgery

## 2022-04-19 MED ORDER — HYDROCODONE-ACETAMINOPHEN 7.5-325 MG PO TABS: 1.0000 | ORAL_TABLET | Freq: Four times a day (QID) | ORAL | 0 refills | Status: DC | PRN

## 2022-04-19 NOTE — Telephone Encounter (Signed)
Pt called requesting to send pharmacy pre auth for her pain medication so she can pick meds up. Please call pt when pre auth has been sent. Pt phone number is [phone].

## 2022-04-19 NOTE — Telephone Encounter (Signed)
Auth sent/done

## 2022-04-20 ENCOUNTER — Telehealth: Payer: Self-pay | Admitting: Orthopaedic Surgery

## 2022-04-20 NOTE — Telephone Encounter (Signed)
Patient called. Says the insurance company would like Caryl Pina to call to get hydrocodone authorized. [phone]

## 2022-04-22 ENCOUNTER — Telehealth: Payer: Self-pay | Admitting: Orthopaedic Surgery

## 2022-04-22 NOTE — Telephone Encounter (Signed)
Patient called. Says she needs her medication. Need Kaylee Salas to call in the authorization for her. Her call back number is [phone]

## 2022-04-22 NOTE — Telephone Encounter (Signed)
Patient aware I have started auth on cover my  meds and sent in a hand written auth by fax this morning

## 2022-04-23 ENCOUNTER — Other Ambulatory Visit: Payer: Self-pay | Admitting: Physician Assistant

## 2022-04-25 ENCOUNTER — Encounter: Payer: 59 | Admitting: Orthopaedic Surgery

## 2022-04-28 ENCOUNTER — Telehealth: Payer: Self-pay | Admitting: Orthopaedic Surgery

## 2022-04-28 ENCOUNTER — Other Ambulatory Visit: Payer: Self-pay | Admitting: Orthopaedic Surgery

## 2022-04-28 MED ORDER — HYDROCODONE-ACETAMINOPHEN 7.5-325 MG PO TABS: 1.0000 | ORAL_TABLET | Freq: Four times a day (QID) | ORAL | 0 refills | Status: DC | PRN

## 2022-04-28 NOTE — Telephone Encounter (Signed)
Christina from [facility] called from pre auth dept. She states pre [place] questionnaire is wrong and new a new pre quth for pt pain medication. [employer] pre auth dept phone number is [phone] and fax number is [phone].

## 2022-04-28 NOTE — Telephone Encounter (Signed)
I called insurance and got medication approved. Pt was called and advised and stated understanding. She stated she paid out of pocket for last refill and picked it up on 04/19/22. She stated she will be out soon and needs another refill. Please advise

## 2022-05-04 ENCOUNTER — Other Ambulatory Visit: Payer: Self-pay | Admitting: Orthopaedic Surgery

## 2022-05-04 MED ORDER — HYDROCODONE-ACETAMINOPHEN 7.5-325 MG PO TABS: 1.0000 | ORAL_TABLET | Freq: Four times a day (QID) | ORAL | 0 refills | Status: DC | PRN

## 2022-05-10 ENCOUNTER — Other Ambulatory Visit: Payer: Self-pay | Admitting: Orthopaedic Surgery

## 2022-05-10 ENCOUNTER — Other Ambulatory Visit: Payer: Self-pay | Admitting: Physician Assistant

## 2022-05-10 MED ORDER — HYDROCODONE-ACETAMINOPHEN 7.5-325 MG PO TABS: 1.0000 | ORAL_TABLET | Freq: Three times a day (TID) | ORAL | 0 refills | Status: DC | PRN

## 2022-05-11 ENCOUNTER — Telehealth: Payer: Self-pay

## 2022-05-11 ENCOUNTER — Ambulatory Visit (INDEPENDENT_AMBULATORY_CARE_PROVIDER_SITE_OTHER): Payer: 59 | Admitting: Orthopaedic Surgery

## 2022-05-11 DIAGNOSIS — Z96651 Presence of right artificial knee joint: Secondary | ICD-10-CM

## 2022-05-11 NOTE — Telephone Encounter (Signed)
Patient called in regarding prior authorization to see if Kaylee Salas was required and to make sure we had new insurance, Holland Falling called patient back, left VM advised no Kaylee Salas was required for procedure per insurance. Ref # [id number]

## 2022-05-11 NOTE — Progress Notes (Signed)
The patient is well-known to me.  She is now 97-monthstatus post a right total knee arthroplasty.  We have replaced her left hip before.  She has well-documented severe end-stage arthritis of the right hip.  That is now the main joint that is causing her issues.  She has tried and failed conservative treatment for the right hip including an intra-articular steroid injection and formal physical therapy.  Her x-rays show significant loss of joint space with para-articular osteophytes around the right hip.  She has done very well with a right knee replacement.  She reports that she has full motion of that right knee.  Her right knee does have full extension and full flexion.  It feels stable ligamentously.  Right hip has severe pain with any internal and external rotation.  Her left operative hip moves normally.  She is scheduled she states to have some type of back injections later this month which I agree with her having.  I am also fine with going ahead and setting her up for a right total hip replacement to treat her severe right hip osteoarthritis.  This should be done later in October which will maximize her recovery from her right knee replacement as well.  All questions and concerns were answered and addressed.  We will work on getting this scheduled for a right hip replacement.

## 2022-05-19 ENCOUNTER — Other Ambulatory Visit: Payer: Self-pay | Admitting: Orthopaedic Surgery

## 2022-05-19 MED ORDER — TIZANIDINE HCL 4 MG PO TABS: 4.0000 mg | ORAL_TABLET | Freq: Three times a day (TID) | ORAL | 0 refills | Status: DC | PRN

## 2022-05-19 MED ORDER — HYDROCODONE-ACETAMINOPHEN 7.5-325 MG PO TABS: 1.0000 | ORAL_TABLET | Freq: Three times a day (TID) | ORAL | 0 refills | Status: DC | PRN

## 2022-05-20 ENCOUNTER — Encounter: Payer: Self-pay | Admitting: Physical Medicine & Rehabilitation

## 2022-05-20 ENCOUNTER — Encounter: Payer: 59 | Attending: Physical Medicine & Rehabilitation | Admitting: Physical Medicine & Rehabilitation

## 2022-05-20 VITALS — BP 115/77 | HR 87 | Ht 62.0 in | Wt 187.0 lb

## 2022-05-20 DIAGNOSIS — M533 Sacrococcygeal disorders, not elsewhere classified: Secondary | ICD-10-CM | POA: Insufficient documentation

## 2022-05-20 MED ORDER — LIDOCAINE HCL 1 % IJ SOLN
10.0000 mL | Freq: Once | INTRAMUSCULAR | Status: AC
  Administered 2022-05-20: 10 mL

## 2022-05-20 MED ORDER — LIDOCAINE HCL (PF) 2 % IJ SOLN
5.0000 mL | Freq: Once | INTRAMUSCULAR | Status: AC
  Administered 2022-05-20: 5 mL

## 2022-05-20 NOTE — Progress Notes (Signed)
RIght Sacroiliac radio frequency Neurotomy  under fluoroscopic guidance This consists of  L5 dorsal ramus , S1,2,3 lateral branch radiofrequency Neurotomy    Indication is sacroiliac pain which has improved temporarily  by at least 50% after Intraarticular sacroiliac and/or L5 DR, S1.2.3 lateral branch blocks under fluoroscopic guidance Pain interferes with self-care and mobility and has failed to respond to conservative measures.  Informed consent was obtained after discussing risks and benefits of the procedure with the patient these include bleeding bruising and infection temporary or permanent paralysis. The patient elects to proceed and has given written consent.  Patient placed prone on fluoroscopy table. Area marked and prepped with Betadine. Fluoroscopic images utilized to guide needle. 25-gauge 1.5 inch needle was used to anesthetize 5 injection points with 2 cc of 1% lidocaine each. Then a 18-gauge 10 cm RF needle with a 10 mm curved active tip was inserted under fluoroscopic guidance first targeting the S1 SA P./sacral ala junction, bone contact made and confirmed with lateral imaging.  motor stimulation at 2 Hz confirm proper needle location followed by injection of 65m of 2% lidocaine MPF. imaging.  Motor stimulation at 2 Hz confirmed proper needle location followed by injection of one ML of the2% lidocaine solution. Radio frequency 80C for 90 seconds was performed. Then the infero- lateral aspect of the S1, S2 and lateral aspect of S3 sacral foramina were targeted. Bone contact made.  Motor stim at 2 Hz confirmed proper needle location. One ML of the 2% lidocaine solution was injected into each of 3 sites and radio frequency ablation 80C for 90 seconds was performed. Patient tolerated procedure well. Post procedure instructions given

## 2022-05-20 NOTE — Patient Instructions (Signed)
Sacroiliac RF on RIght today will do left side today

## 2022-05-20 NOTE — Progress Notes (Signed)
  PROCEDURE RECORD [facility]   Name: Kaylee Salas DOB:[birth year] MRN: [id number]  Date:05/20/2022  Physician: Alysia Penna, MD    Nurse/CMA: Truman Hayward, CMA  Allergies: No Known Allergies  Consent Signed: Yes.    Is patient diabetic? No.  CBG today? .  Pregnant: No. LMP: No LMP recorded. Patient is postmenopausal. (age 56-55)  Anticoagulants: no Anti-inflammatory: yes (Advil) Antibiotics: no  Procedure: Right Sacroiliac Radiofrequency  Position: Prone Start Time: 10:48 am  End Time: 11:13 am  Fluoro Time: 32  RN/CMA Truman Hayward, CMA Carmeline Kowal, CMA    Time 10:30 am 11:15 am    BP 115/77 118/77    Pulse 87 73    Respirations 94 94    O2 Sat 16 16    S/S 6 6    Pain Level 8/10 0/10     D/C home with a friend, [name] & O X 3, D/C instructions reviewed, and sits independently.

## 2022-05-24 ENCOUNTER — Encounter: Payer: Self-pay | Admitting: Physical Medicine & Rehabilitation

## 2022-05-24 ENCOUNTER — Encounter: Payer: Self-pay | Admitting: Orthopaedic Surgery

## 2022-05-30 ENCOUNTER — Other Ambulatory Visit: Payer: Self-pay | Admitting: Orthopaedic Surgery

## 2022-05-30 ENCOUNTER — Encounter: Payer: Self-pay | Admitting: Orthopaedic Surgery

## 2022-05-31 ENCOUNTER — Other Ambulatory Visit: Payer: Self-pay | Admitting: Orthopaedic Surgery

## 2022-05-31 MED ORDER — TIZANIDINE HCL 4 MG PO TABS: 4.0000 mg | ORAL_TABLET | Freq: Three times a day (TID) | ORAL | 0 refills | Status: DC | PRN

## 2022-05-31 MED ORDER — TRAMADOL HCL 50 MG PO TABS: 50.0000 mg | ORAL_TABLET | Freq: Four times a day (QID) | ORAL | 0 refills | Status: DC | PRN

## 2022-06-07 ENCOUNTER — Other Ambulatory Visit: Payer: Self-pay

## 2022-06-09 ENCOUNTER — Other Ambulatory Visit: Payer: Self-pay | Admitting: Orthopaedic Surgery

## 2022-06-09 MED ORDER — TRAMADOL HCL 50 MG PO TABS: 50.0000 mg | ORAL_TABLET | Freq: Four times a day (QID) | ORAL | 0 refills | Status: DC | PRN

## 2022-06-09 MED ORDER — TIZANIDINE HCL 4 MG PO TABS: 4.0000 mg | ORAL_TABLET | Freq: Three times a day (TID) | ORAL | 0 refills | Status: DC | PRN

## 2022-06-17 ENCOUNTER — Other Ambulatory Visit: Payer: Self-pay | Admitting: Orthopaedic Surgery

## 2022-06-17 ENCOUNTER — Other Ambulatory Visit: Payer: Self-pay | Admitting: Physician Assistant

## 2022-06-17 MED ORDER — TIZANIDINE HCL 4 MG PO TABS: 4.0000 mg | ORAL_TABLET | Freq: Three times a day (TID) | ORAL | 0 refills | Status: DC | PRN

## 2022-06-17 MED ORDER — TRAMADOL HCL 50 MG PO TABS: 50.0000 mg | ORAL_TABLET | Freq: Four times a day (QID) | ORAL | 0 refills | Status: DC | PRN

## 2022-06-23 ENCOUNTER — Other Ambulatory Visit: Payer: Self-pay | Admitting: Physician Assistant

## 2022-06-27 ENCOUNTER — Other Ambulatory Visit: Payer: Self-pay | Admitting: Orthopaedic Surgery

## 2022-06-27 MED ORDER — TIZANIDINE HCL 4 MG PO TABS: 4.0000 mg | ORAL_TABLET | Freq: Three times a day (TID) | ORAL | 0 refills | Status: DC | PRN

## 2022-06-27 MED ORDER — TRAMADOL HCL 50 MG PO TABS: 50.0000 mg | ORAL_TABLET | Freq: Four times a day (QID) | ORAL | 0 refills | Status: DC | PRN

## 2022-06-28 NOTE — Pre-Procedure Instructions (Incomplete)
Surgical Instructions    Your procedure is scheduled on Tuesday October 7.  Report to [facility] Main Entrance "A" at 1:15 P.M., then check in with the Admitting office.  Call this number if you have problems the morning of surgery:  [phone]  If you have any questions prior to your surgery date call [phone]: Open Monday-Friday 8am-4pm  If you experience any cold, Covid, or flu symptoms such as cough, fever, chills, shortness of breath, etc. between now and your scheduled surgery, please notify us at the above number     Patient Instructions  The night before surgery:  No food after midnight. ONLY clear liquids after midnight  The day of surgery (if you do NOT have diabetes):  You may drink clear liquids until 12:15 the afternoon of your surgery.   Clear liquids allowed are: Water, Non-Citrus Juices (without pulp), Carbonated Beverages, Clear Tea, Black Coffee ONLY (NO MILK, CREAM OR POWDERED CREAMER of any kind), and Gatorade Drink ONE (1) Pre-Surgery Clear Ensure by 12:15 the morning of surgery. Drink in one sitting. Do not sip.  This drink was given to you during your hospital pre-op appointment visit.  Nothing else to drink after completing the Pre-Surgery Clear Ensure.         If you have questions, please contact your surgeon's office.    Take these medications the morning of surgery with A SIP OF WATER:  DULoxetine (CYMBALTA) '20mg'$  DULoxetine (CYMBALTA) '60mg'$  sertraline (ZOLOFT) traMADol (ULTRAM)  You may take these medications with A SIP OF WATER IF YOU NEED THEM: clonazePAM (KLONOPIN) ondansetron (ZOFRAN-ODT) rOPINIRole (REQUIP) tiZANidine (ZANAFLEX)   Follow your surgeon's instructions on when to stop Aspirin.  If no instructions were given by your surgeon then you will need to call the office to get those instructions.     As of today, STOP taking any Aleve, Naproxen, Ibuprofen, Motrin, Advil, Goody's, BC's, all herbal medications, fish oil, and all  vitamins. THIS INCLUDES Aspirin-Acetaminophen-Caffeine (GOODY HEADACHE PO), meloxicam (MOBIC)           Do NOT Smoke (Tobacco/Vaping)  24 hours prior to your procedure  If you use a CPAP at night, you may bring your mask for your overnight stay.   Contacts, glasses, hearing aids, dentures or partials may not be worn into surgery, please bring cases for these belongings   For patients admitted to the hospital, discharge time will be determined by your treatment team.   Patients discharged the day of surgery will not be allowed to drive home, and someone needs to stay with them for 24 hours.   SURGICAL WAITING ROOM VISITATION Patients having surgery or a procedure may have no more than 2 support people in the waiting area - these visitors may rotate.   Children under the age of 76 must have an adult with them who is not the patient. If the patient needs to stay at the hospital during part of their recovery, the visitor guidelines for inpatient rooms apply. Pre-op nurse will coordinate an appropriate time for 1 support person to accompany patient in pre-op.  This support person may not rotate.   Please refer to the [facility] website for the visitor guidelines for Inpatients (after your surgery is over and you are in a regular room).    Special instructions:    Oral Hygiene is also important to reduce your risk of infection.  Remember -  BRUSH YOUR TEETH THE MORNING OF SURGERY WITH YOUR REGULAR TOOTHPASTE   [place]- Preparing  For Surgery  Before surgery, you can play an important role. Because skin is not sterile, your skin needs to be as free of germs as possible. You can reduce the number of germs on your skin by washing with CHG (chlorahexidine gluconate) Soap before surgery.  CHG is an antiseptic cleaner which kills germs and bonds with the skin to continue killing germs even after washing.     Please do not use if you have an allergy to CHG or antibacterial soaps. If your skin  becomes reddened/irritated stop using the CHG.  Do not shave (including legs and underarms) for at least 48 hours prior to first CHG shower. It is OK to shave your face.  Please follow these instructions carefully.    Shower the NIGHT BEFORE SURGERY and the MORNING OF SURGERY with CHG Soap.  If you chose to wash your hair, wash your hair first as usual with your normal shampoo.  After you shampoo, rinse your hair and body thoroughly to remove the shampoo.   Then [employer] and genitals (private parts) with your normal soap and rinse thoroughly to remove soap.  After that Use CHG Soap as you would any other liquid soap.  You can apply CHG directly to the skin and wash gently with a scrungie or a clean washcloth.   Apply the CHG Soap to your body ONLY FROM THE NECK DOWN.   Do not use on open wounds or open sores.  Avoid contact with your eyes, ears, mouth and genitals (private parts).  Wash thoroughly, paying special attention to the area where your surgery will be performed.  Thoroughly rinse your body with warm water from the neck down.  DO NOT shower/wash with your normal soap after using and rinsing off the CHG Soap.  Pat yourself dry with a CLEAN TOWEL.  Wear CLEAN PAJAMAS to bed the night before surgery  Place CLEAN SHEETS on your bed the night before your surgery  DO NOT SLEEP WITH PETS.   Day of Surgery:  Take a shower with CHG soap. Wear Clean/Comfortable clothing the morning of surgery Brush your teeth WITH YOUR REGULAR TOOTHPASTE. Do not wear jewelry or makeup. Do not wear lotions, powders, perfumes or deodorant. Do not shave 48 hours prior to surgery. Do not bring valuables to the hospital.  [facility] is not responsible for any belongings or valuables.  Do not wear nail polish, gel polish, artificial nails, or any other type of covering on natural nails (fingers and toes) If you have artificial nails or gel coating that need to be removed by a nail salon, please  have this removed prior to surgery. Artificial nails or gel coating may interfere with anesthesia's ability to adequately monitor your vital signs.    If you received a COVID test during your pre-op visit, it is requested that you wear a mask when out in public, stay away from anyone that may not be feeling well, and notify your surgeon if you develop symptoms. If you have been in contact with anyone that has tested positive in the last 10 days, please notify your surgeon.    Please read over the following fact sheets that you were given.

## 2022-06-29 ENCOUNTER — Inpatient Hospital Stay (HOSPITAL_COMMUNITY)
Admission: RE | Admit: 2022-06-29 | Discharge: 2022-06-29 | Disposition: A | Discharge status: Home / Self Care | Payer: 59 | Source: Ambulatory Visit

## 2022-06-30 ENCOUNTER — Encounter: Payer: Self-pay | Admitting: Physical Medicine & Rehabilitation

## 2022-06-30 ENCOUNTER — Encounter: Payer: 59 | Attending: Physical Medicine & Rehabilitation | Admitting: Physical Medicine & Rehabilitation

## 2022-06-30 VITALS — BP 122/83 | HR 99 | Ht 62.0 in | Wt 174.0 lb

## 2022-06-30 DIAGNOSIS — M533 Sacrococcygeal disorders, not elsewhere classified: Secondary | ICD-10-CM | POA: Insufficient documentation

## 2022-06-30 MED ORDER — LIDOCAINE HCL 1 % IJ SOLN
10.0000 mL | Freq: Once | INTRAMUSCULAR | Status: AC
  Administered 2022-06-30: 10 mL

## 2022-06-30 MED ORDER — LIDOCAINE HCL (PF) 2 % IJ SOLN
5.0000 mL | Freq: Once | INTRAMUSCULAR | Status: AC
  Administered 2022-06-30: 5 mL

## 2022-06-30 NOTE — Progress Notes (Signed)
Left Sacroiliac radio frequency Neurotomy  under fluoroscopic guidance This consists of  L5 dorsal ramus , S1,2,3 lateral branch radiofrequency Neurotomy    Indication is sacroiliac pain which has improved temporarily  by at least 50% after Intraarticular sacroiliac and/or L5 DR, S1.2.3 lateral branch blocks under fluoroscopic guidance Pain interferes with self-care and mobility and has failed to respond to conservative measures.  Informed consent was obtained after discussing risks and benefits of the procedure with the patient these include bleeding bruising and infection temporary or permanent paralysis. The patient elects to proceed and has given written consent.  Patient placed prone on fluoroscopy table. Area marked and prepped with Betadine. Fluoroscopic images utilized to guide needle. 25-gauge 1.5 inch needle was used to anesthetize 5 injection points with 2 cc of 1% lidocaine each. Then a 18-gauge 10 cm RF needle with a 10 mm curved active tip was inserted under fluoroscopic guidance first targeting the S1 SA P./sacral ala junction, bone contact made and confirmed with lateral imaging.  motor stimulation at 2 Hz confirm proper needle location followed by injection of 78m of 2% lidocaine MPF. imaging.  Motor stimulation at 2 Hz confirmed proper needle location followed by injection of one ML of the2% lidocaine solution. Radio frequency 80C for 90 seconds was performed. Then the infero- lateral aspect of the S1, S2 and lateral aspect of S3 sacral foramina were targeted. Bone contact made.  Motor stim at 2 Hz confirmed proper needle location. One ML of the 2% lidocaine solution was injected into each of 3 sites and radio frequency ablation 80C for 90 seconds was performed. Patient tolerated procedure well. Post procedure instructions given

## 2022-06-30 NOTE — Progress Notes (Signed)
  [facility]   Name: Kaylee Salas DOB:[birth year] MRN: [id number]  Date:06/30/2022  Physician: Alysia Penna, MD    Nurse/CMA: Desani Sprung RMA   Allergies: No Known Allergies  Consent Signed: Yes.    Is patient diabetic? No.  CBG today? .  Pregnant: No. LMP: No LMP recorded. Patient is postmenopausal. (age 56-55)  Anticoagulants: no Anti-inflammatory: no Antibiotics: no  Procedure: Left Sacroiliac Radiofrequency  Position: Prone Start Time: 3:43pm  End Time: 3:58pm  Fluoro Time: 37  RN/CMA Yuto Cajuste RMA Valeria Krisko RMA     Time 2:15pm 4:05pm    BP 122/83 135/74    Pulse 99 89    Respirations 16 16    O2 Sat 98 98    S/S 6 6    Pain Level 8/10 0/10     D/C home with Suanne Marker, [name] & O X 3, D/C instructions reviewed, and sits independently.

## 2022-06-30 NOTE — Patient Instructions (Addendum)
You had a radio frequency procedure today This was done to alleviate joint pain in your sacral  area We injected lidocaine which is a local anesthetic.  You may experience soreness at the injection sites. You may also experienced some irritation of the nerves that were heated I'm recommending ice for 30 minutes every 2 hours as needed for the next 24-48 hours

## 2022-07-04 ENCOUNTER — Other Ambulatory Visit: Payer: Self-pay

## 2022-07-04 ENCOUNTER — Encounter (HOSPITAL_COMMUNITY): Payer: Self-pay

## 2022-07-04 ENCOUNTER — Encounter (HOSPITAL_COMMUNITY)
Admission: RE | Admit: 2022-07-04 | Discharge: 2022-07-04 | Disposition: A | Discharge status: Home / Self Care | Payer: 59 | Source: Ambulatory Visit | Attending: Orthopaedic Surgery | Admitting: Orthopaedic Surgery

## 2022-07-04 VITALS — BP 119/58 | HR 77 | Temp 97.8°F | Resp 17 | Ht 61.0 in | Wt 191.2 lb

## 2022-07-04 DIAGNOSIS — Z01812 Encounter for preprocedural laboratory examination: Secondary | ICD-10-CM | POA: Diagnosis not present

## 2022-07-04 DIAGNOSIS — Z01818 Encounter for other preprocedural examination: Secondary | ICD-10-CM

## 2022-07-04 HISTORY — DX: Sacrococcygeal disorders, not elsewhere classified: M53.3

## 2022-07-04 LAB — CBC
HCT: 30.8 % — ABNORMAL LOW (ref 36.0–46.0)
Hemoglobin: 9.3 g/dL — ABNORMAL LOW (ref 12.0–15.0)
MCH: 23.3 pg — ABNORMAL LOW (ref 26.0–34.0)
MCHC: 30.2 g/dL (ref 30.0–36.0)
MCV: 77 fL — ABNORMAL LOW (ref 80.0–100.0)
Platelets: 289 10*3/uL (ref 150–400)
RBC: 4 MIL/uL (ref 3.87–5.11)
RDW: 18.5 % — ABNORMAL HIGH (ref 11.5–15.5)
WBC: 5.1 10*3/uL (ref 4.0–10.5)
nRBC: 0 % (ref 0.0–0.2)

## 2022-07-04 LAB — SURGICAL PCR SCREEN
MRSA, PCR: NEGATIVE
Staphylococcus aureus: NEGATIVE

## 2022-07-04 NOTE — Progress Notes (Addendum)
PCP - Emelda Fear, DO Cardiologist - Denies  PPM/ICD - Denies  Chest x-ray - NI EKG - NI Stress Test - Yes "I had one in 2003 before my gastric bypass" ECHO - Denies Cardiac Cath - Denies  Sleep Study - yes in 2003 had OSA after the weight loss no longer had OSa  Denies DM  Blood Thinner Instructions:Denies Aspirin Instructions:Denies  ERAS Protcol -Yes PRE-SURGERY Ensure   COVID TEST- NI   Anesthesia review: Yes Hgb 9.3 (Patient stated she takes 2-4 Goody packs / day.  She stated she stopped taking them two weeks ago)  Patient denies shortness of breath, fever, cough and chest pain at PAT appointment   All instructions explained to the patient, with a verbal understanding of the material. Patient agrees to go over the instructions while at home for a better understanding. The opportunity to ask questions was provided.

## 2022-07-05 ENCOUNTER — Ambulatory Visit (HOSPITAL_COMMUNITY): Admission: RE | Admit: 2022-07-05 | Payer: 59 | Source: Home / Self Care | Admitting: Orthopaedic Surgery

## 2022-07-05 ENCOUNTER — Encounter (HOSPITAL_COMMUNITY): Admission: RE | Payer: Self-pay | Source: Home / Self Care

## 2022-07-05 SURGERY — ARTHROPLASTY, HIP, TOTAL, ANTERIOR APPROACH
Anesthesia: Spinal | Site: Hip | Laterality: Right

## 2022-07-06 ENCOUNTER — Other Ambulatory Visit: Payer: Self-pay | Admitting: Orthopaedic Surgery

## 2022-07-06 MED ORDER — TRAMADOL HCL 50 MG PO TABS: 50.0000 mg | ORAL_TABLET | Freq: Four times a day (QID) | ORAL | 0 refills | Status: DC | PRN

## 2022-07-07 ENCOUNTER — Telehealth: Payer: Self-pay

## 2022-07-07 DIAGNOSIS — R7989 Other specified abnormal findings of blood chemistry: Secondary | ICD-10-CM

## 2022-07-07 NOTE — Telephone Encounter (Signed)
Patient left voice mail asking if Dr. Ninfa Linden could refer her to hematologist for anemia.  She states her PCP will not help her.  [phone]

## 2022-07-07 NOTE — Addendum Note (Signed)
Addended by: Robyne Peers on: 07/07/2022 03:13 PM   Modules accepted: Orders

## 2022-07-07 NOTE — Telephone Encounter (Signed)
Referral placed in chart  

## 2022-07-07 NOTE — Telephone Encounter (Signed)
I called and talked to the pt. She would like the referral sent to [facility]

## 2022-07-18 ENCOUNTER — Inpatient Hospital Stay: Payer: Medicare Other | Attending: Hematology | Admitting: Hematology

## 2022-07-18 ENCOUNTER — Encounter: Payer: 59 | Admitting: Orthopaedic Surgery

## 2022-07-18 VITALS — BP 130/77 | HR 68 | Temp 97.8°F | Resp 18 | Ht 61.61 in | Wt 188.9 lb

## 2022-07-18 DIAGNOSIS — R0609 Other forms of dyspnea: Secondary | ICD-10-CM | POA: Diagnosis not present

## 2022-07-18 DIAGNOSIS — E559 Vitamin D deficiency, unspecified: Secondary | ICD-10-CM

## 2022-07-18 DIAGNOSIS — Z9049 Acquired absence of other specified parts of digestive tract: Secondary | ICD-10-CM | POA: Diagnosis not present

## 2022-07-18 DIAGNOSIS — Z5941 Food insecurity: Secondary | ICD-10-CM | POA: Insufficient documentation

## 2022-07-18 DIAGNOSIS — Z8249 Family history of ischemic heart disease and other diseases of the circulatory system: Secondary | ICD-10-CM | POA: Diagnosis not present

## 2022-07-18 DIAGNOSIS — Z79899 Other long term (current) drug therapy: Secondary | ICD-10-CM | POA: Diagnosis not present

## 2022-07-18 DIAGNOSIS — Z809 Family history of malignant neoplasm, unspecified: Secondary | ICD-10-CM | POA: Diagnosis not present

## 2022-07-18 DIAGNOSIS — D649 Anemia, unspecified: Secondary | ICD-10-CM

## 2022-07-18 DIAGNOSIS — F5089 Other specified eating disorder: Secondary | ICD-10-CM | POA: Insufficient documentation

## 2022-07-18 DIAGNOSIS — Z800 Family history of malignant neoplasm of digestive organs: Secondary | ICD-10-CM | POA: Insufficient documentation

## 2022-07-18 DIAGNOSIS — D509 Iron deficiency anemia, unspecified: Secondary | ICD-10-CM | POA: Diagnosis not present

## 2022-07-18 DIAGNOSIS — Z8349 Family history of other endocrine, nutritional and metabolic diseases: Secondary | ICD-10-CM | POA: Diagnosis not present

## 2022-07-18 DIAGNOSIS — Z9884 Bariatric surgery status: Secondary | ICD-10-CM | POA: Insufficient documentation

## 2022-07-18 DIAGNOSIS — R69 Illness, unspecified: Secondary | ICD-10-CM | POA: Diagnosis not present

## 2022-07-18 NOTE — Patient Instructions (Addendum)
[facility]  Discharge Instructions  You were seen and examined today by Dr. Katragadda. Dr. Katragadda is a hematologist, meaning that he specializes in blood abnormalities. Dr. Katragadda discussed your past medical history, family history of cancers/blood conditions and the events that led to you being here today.  You were referred to Dr. Katragadda due to anemia.  Dr. Katragadda has recommended additional labs today for further evaluation.  Follow-up as scheduled.  Thank you for choosing  [facility] to provide your oncology and hematology care.   To afford each patient quality time with our provider, please arrive at least 15 minutes before your scheduled appointment time. You may need to reschedule your appointment if you arrive late (10 or more minutes). Arriving late affects you and other patients whose appointments are after yours.  Also, if you miss three or more appointments without notifying the office, you may be dismissed from the clinic at the provider's discretion.    Again, thank you for choosing [facility].  Our hope is that these requests will decrease the amount of time that you wait before being seen by our physicians.   If you have a lab appointment with the Cancer Center please come in thru the Main Entrance and check in at the main information desk.           _____________________________________________________________  Should you have questions after your visit to [facility], please contact our office at [phone] and follow the prompts.  Our office hours are 8:00 a.m. to 4:30 p.m. Monday - Thursday and 8:00 a.m. to 2:30 p.m. Friday.  Please note that voicemails left after 4:00 p.m. may not be returned until the following business day.  We are closed weekends and all major holidays.  You do have access to a nurse 24-7, just call the main number to the clinic [phone] and do not  press any options, hold on the line and a nurse will answer the phone.    For prescription refill requests, have your pharmacy contact our office and allow 72 hours.    Masks are optional in the cancer centers. If you would like for your care team to wear a mask while they are taking care of you, please let them know. You may have one support person who is at least 56 years old accompany you for your appointments.  

## 2022-07-18 NOTE — Progress Notes (Signed)
CONSULT NOTE  Patient Care Team: Emelda Fear, DO as PCP - General (Family Medicine)  CHIEF COMPLAINTS/PURPOSE OF CONSULTATION:  Severe microcytic anemia  HISTORY OF PRESENTING ILLNESS:  Kaylee Salas 56 y.o. female is seen in consultation today at the request of Dr. Ninfa Linden for severe microcytic anemia.  She was scheduled for right hip surgery on 07/05/2022.  However her CBC on 07/04/2022 showed hemoglobin 9.3 with MCV 77.  Hence the surgery was held.  She has a history of gastric bypass surgery done in 2003 and has received parenteral iron therapy in the past by me in [place].  She recollects her last blood transfusion was around 2012.  She has not taken B12 injections for the past couple of years.  She reports having ice pica and also likes the smell of gasoline.  She denies any bleeding per rectum or melena.  No vaginal bleeding reported.  She reports severe fatigue for the past few months.  She also has dyspnea on exertion.    MEDICAL HISTORY:  Past Medical History:  Diagnosis Date   Anemia    Anxiety    Arthritis    Complication of anesthesia    "woke up panicking/fighting"   Depression    Osteoporosis    PONV (postoperative nausea and vomiting)    Sacroiliac joint disease    Unilateral primary osteoarthritis, left hip 10/30/2018    SURGICAL HISTORY: Past Surgical History:  Procedure Laterality Date   CHOLECYSTECTOMY  2004   GASTRIC BYPASS     HAND SURGERY Right    ring finger reconstruction   HERNIA REPAIR     KNEE ARTHROSCOPY W/ ACL RECONSTRUCTION Right 1997   TOTAL HIP ARTHROPLASTY Left 10/30/2018   Procedure: LEFT TOTAL HIP ARTHROPLASTY ANTERIOR APPROACH;  Surgeon: Mcarthur Rossetti, MD;  Location: [facility];  Service: Orthopedics;  Laterality: Left;   TOTAL KNEE ARTHROPLASTY Right 02/08/2022   Procedure: RIGHT TOTAL KNEE ARTHROPLASTY;  Surgeon: Mcarthur Rossetti, MD;  Location: [facility];  Service: Orthopedics;  Laterality: Right;   UMBILICAL HERNIA  REPAIR  2004    SOCIAL HISTORY: Social History   Socioeconomic History   Marital status: Single    Spouse name: Not on file   Number of children: Not on file   Years of education: Not on file   Highest education level: Not on file  Occupational History   Not on file  Tobacco Use   Smoking status: Never   Smokeless tobacco: Never  Vaping Use   Vaping Use: Never used  Substance and Sexual Activity   Alcohol use: Not Currently    Comment: occassionally   Drug use: Never   Sexual activity: Not Currently  Other Topics Concern   Not on file  Social History Narrative   Not on file   Social Determinants of Health   Financial Resource Strain: Not on file  Food Insecurity: Food Insecurity Present (07/18/2022)   Hunger Vital Sign    Worried About [place] in the Last Year: Often true    Ran Out of Food in the Last Year: Sometimes true  Transportation Needs: No Transportation Needs (07/18/2022)   PRAPARE - Hydrologist (Medical): No    Lack of Transportation (Non-Medical): No  Physical Activity: Not on file  Stress: Not on file  Social Connections: Not on file  Intimate Partner Violence: Not At Risk (07/18/2022)   Humiliation, Afraid, Rape, and Kick questionnaire    Fear of Current  or Ex-Partner: No    Emotionally Abused: No    Physically Abused: No    Sexually Abused: No    FAMILY HISTORY: Family History  Problem Relation Age of Onset   Hypertension Mother    Hyperlipidemia Mother    Heart attack Mother    Heart attack Father    Hyperlipidemia Father    Hypertension Father    Heart failure Father     ALLERGIES:  has No Known Allergies.  MEDICATIONS:  Current Outpatient Medications  Medication Sig Dispense Refill   ASPIRIN LOW DOSE 81 MG chewable tablet CHEW 1 TABLET (81 MG TOTAL) BY MOUTH 2 (TWO) TIMES DAILY. 30 tablet 0   Aspirin-Acetaminophen-Caffeine (GOODY HEADACHE PO) Take 2 Packages by mouth 2 (two) times daily as  needed (headache).     DULoxetine (CYMBALTA) 60 MG capsule Take 120 mg by mouth daily.     HYDROcodone-acetaminophen (NORCO) 7.5-325 MG tablet Take 1 tablet by mouth every 8 (eight) hours as needed (pain).     hydrOXYzine (ATARAX/VISTARIL) 25 MG tablet Take 25 mg by mouth 2 (two) times daily.     meloxicam (MOBIC) 15 MG tablet TAKE 1 TABLET BY MOUTH EVERY DAY AS NEEDED FOR PAIN (Patient taking differently: Take 15 mg by mouth daily as needed for pain.) 30 tablet 3   methocarbamol (ROBAXIN) 750 MG tablet Take 750 mg by mouth 2 (two) times daily as needed for muscle spasms.     rOPINIRole (REQUIP) 1 MG tablet Take 1-2 mg by mouth at bedtime as needed (restless leg).     tiZANidine (ZANAFLEX) 4 MG tablet Take 1 tablet (4 mg total) by mouth every 8 (eight) hours as needed for muscle spasms. 30 tablet 0   traMADol (ULTRAM) 50 MG tablet Take 1 tablet (50 mg total) by mouth every 6 (six) hours as needed (pain). 40 tablet 0   No current facility-administered medications for this visit.    REVIEW OF SYSTEMS:   Constitutional: Denies fevers, chills or abnormal night sweats.  Positive for severe fatigue. Eyes: Denies blurriness of vision, double vision or watery eyes Ears, nose, mouth, throat, and face: Denies mucositis or sore throat Respiratory: Positive for dyspnea on exertion. Cardiovascular: Denies palpitation, chest discomfort or lower extremity swelling Gastrointestinal:  Denies heartburn or change in bowel habits.  Positive for occasional nausea and constipation. Skin: Denies abnormal skin rashes Lymphatics: Denies new lymphadenopathy or easy bruising Neurological:Denies numbness, tingling or new weaknesses Behavioral/Psych: Mood is stable, no new changes  All other systems were reviewed with the patient and are negative.  PHYSICAL EXAMINATION: ECOG PERFORMANCE STATUS: 1 - Symptomatic but completely ambulatory  Vitals:   07/18/22 1517  BP: 130/77  Pulse: 68  Resp: 18  Temp: 97.8 F  (36.6 C)  SpO2: 98%   Filed Weights   07/18/22 1517  Weight: 188 lb 14.4 oz (85.7 kg)    GENERAL:alert, no distress and comfortable SKIN: skin color, texture, turgor are normal, no rashes or significant lesions EYES: normal, conjunctiva are pink and non-injected, sclera clear OROPHARYNX:no exudate, no erythema and lips, buccal mucosa, and tongue normal  NECK: supple, thyroid normal size, non-tender, without nodularity LYMPH:  no palpable lymphadenopathy in the cervical, axillary or inguinal LUNGS: clear to auscultation and percussion with normal breathing effort HEART: regular rate & rhythm and no murmurs and no lower extremity edema ABDOMEN:abdomen soft, non-tender and normal bowel sounds Musculoskeletal:no cyanosis of digits and no clubbing  PSYCH: alert & oriented x 3 with fluent speech NEURO:  no focal motor/sensory deficits  LABORATORY DATA:  I have reviewed the data as listed Recent Results (from the past 2160 hour(s))  CBC per protocol     Status: Abnormal   Collection Time: 07/04/22  8:50 AM  Result Value Ref Range   WBC 5.1 4.0 - 10.5 K/uL   RBC 4.00 3.87 - 5.11 MIL/uL   Hemoglobin 9.3 (L) 12.0 - 15.0 g/dL   HCT 30.8 (L) 36.0 - 46.0 %   MCV 77.0 (L) 80.0 - 100.0 fL   MCH 23.3 (L) 26.0 - 34.0 pg   MCHC 30.2 30.0 - 36.0 g/dL   RDW 18.5 (H) 11.5 - 15.5 %   Platelets 289 150 - 400 K/uL   nRBC 0.0 0.0 - 0.2 %    Comment: Performed at [facility], [address], [place], [place] [address]  Surgical pcr screen     Status: None   Collection Time: 07/04/22  8:51 AM   Specimen: Nasal Mucosa; Nasal Swab  Result Value Ref Range   MRSA, PCR NEGATIVE NEGATIVE   Staphylococcus aureus NEGATIVE NEGATIVE    Comment: (NOTE) The Xpert SA Assay (FDA approved for NASAL specimens in patients 12 years of age and older), is one component of a comprehensive surveillance program. It is not intended to diagnose infection nor to guide or monitor treatment. Performed at [facility], [place] [address] [place], [place] [address]     RADIOGRAPHIC STUDIES: I have personally reviewed the radiological images as listed and agreed with the findings in the report. No results found.  ASSESSMENT:  1.  Severe microcytic anemia: - Patient seen at the request of Dr. Ninfa Linden for management of severe microcytic anemia as she is scheduled for right hip replacement surgery on 07/05/2022, delayed due to severe anemia. - CBC (07/04/2022): Hb-9.3, MCV-77. - Patient has history of gastric bypass in 2003.  She had to receive parenteral iron therapy in the past in [place]. - She also stopped taking B12 injections 2 years ago.  Last blood transfusion was around 2012. - Denies any bleeding per rectum or melena. - Has ice pica.  Also likes to smell gasoline when her iron levels are low.  2.  Social/family history: - She lives at home by herself.  Recently on disability secondary to fall at work.  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  Non-smoker. - Maternal aunt had cervical cancer.  Another maternal aunt had cancer, type unknown to the patient.  Paternal grandfather had liver cancer.  PLAN:  1.  Severe microcytic anemia: - Recommend checking ferritin and iron panel as a baseline. - We will also check for other nutritional deficiencies including C78, folic acid, MMA, copper and vitamin D levels. - She is complaining of joint pains.  Will check for ANA, rheumatoid factor, ESR and CRP. - We will schedule for parenteral iron therapy with Venofer x3 doses for a total of 1 g. - We talked about infusion related reactions including rare chance of anaphylactic reactions. - RTC 6 weeks for follow-up with repeat CBC, ferritin and iron panel.   All questions were answered. The patient knows to call the clinic with any problems, questions or concerns.      Derek Jack, MD 07/18/22 5:04 PM

## 2022-07-22 ENCOUNTER — Other Ambulatory Visit: Payer: Self-pay | Admitting: Orthopaedic Surgery

## 2022-07-22 ENCOUNTER — Encounter: Payer: Self-pay | Admitting: Orthopaedic Surgery

## 2022-07-25 ENCOUNTER — Other Ambulatory Visit: Payer: Self-pay | Admitting: Orthopaedic Surgery

## 2022-07-25 MED ORDER — TIZANIDINE HCL 4 MG PO TABS: 4.0000 mg | ORAL_TABLET | Freq: Three times a day (TID) | ORAL | 0 refills | Status: DC | PRN

## 2022-07-25 MED ORDER — TRAMADOL HCL 50 MG PO TABS: 50.0000 mg | ORAL_TABLET | Freq: Four times a day (QID) | ORAL | 0 refills | Status: DC | PRN

## 2022-07-27 ENCOUNTER — Other Ambulatory Visit: Payer: Self-pay

## 2022-07-27 DIAGNOSIS — E559 Vitamin D deficiency, unspecified: Secondary | ICD-10-CM

## 2022-07-27 DIAGNOSIS — D649 Anemia, unspecified: Secondary | ICD-10-CM

## 2022-07-28 ENCOUNTER — Inpatient Hospital Stay: Payer: Medicare Other

## 2022-07-28 ENCOUNTER — Encounter: Payer: Self-pay | Admitting: Hematology

## 2022-07-28 VITALS — BP 118/59 | HR 76 | Temp 97.6°F | Resp 16 | Wt 181.8 lb

## 2022-07-28 DIAGNOSIS — D509 Iron deficiency anemia, unspecified: Secondary | ICD-10-CM | POA: Diagnosis not present

## 2022-07-28 DIAGNOSIS — D649 Anemia, unspecified: Secondary | ICD-10-CM

## 2022-07-28 DIAGNOSIS — E559 Vitamin D deficiency, unspecified: Secondary | ICD-10-CM

## 2022-07-28 LAB — VITAMIN D 25 HYDROXY (VIT D DEFICIENCY, FRACTURES): Vit D, 25-Hydroxy: 42.08 ng/mL (ref 30–100)

## 2022-07-28 LAB — CBC WITH DIFFERENTIAL/PLATELET
Abs Immature Granulocytes: 0.01 10*3/uL (ref 0.00–0.07)
Basophils Absolute: 0 10*3/uL (ref 0.0–0.1)
Basophils Relative: 1 %
Eosinophils Absolute: 0.2 10*3/uL (ref 0.0–0.5)
Eosinophils Relative: 4 %
HCT: 35.8 % — ABNORMAL LOW (ref 36.0–46.0)
Hemoglobin: 10.7 g/dL — ABNORMAL LOW (ref 12.0–15.0)
Immature Granulocytes: 0 %
Lymphocytes Relative: 42 %
Lymphs Abs: 2.1 10*3/uL (ref 0.7–4.0)
MCH: 23.1 pg — ABNORMAL LOW (ref 26.0–34.0)
MCHC: 29.9 g/dL — ABNORMAL LOW (ref 30.0–36.0)
MCV: 77.3 fL — ABNORMAL LOW (ref 80.0–100.0)
Monocytes Absolute: 0.5 10*3/uL (ref 0.1–1.0)
Monocytes Relative: 10 %
Neutro Abs: 2.2 10*3/uL (ref 1.7–7.7)
Neutrophils Relative %: 43 %
Platelets: 312 10*3/uL (ref 150–400)
RBC: 4.63 MIL/uL (ref 3.87–5.11)
RDW: 18.2 % — ABNORMAL HIGH (ref 11.5–15.5)
WBC: 5.1 10*3/uL (ref 4.0–10.5)
nRBC: 0 % (ref 0.0–0.2)

## 2022-07-28 LAB — COMPREHENSIVE METABOLIC PANEL
ALT: 15 U/L (ref 0–44)
AST: 22 U/L (ref 15–41)
Albumin: 3.7 g/dL (ref 3.5–5.0)
Alkaline Phosphatase: 121 U/L (ref 38–126)
Anion gap: 9 (ref 5–15)
BUN: 15 mg/dL (ref 6–20)
CO2: 26 mmol/L (ref 22–32)
Calcium: 8.6 mg/dL — ABNORMAL LOW (ref 8.9–10.3)
Chloride: 104 mmol/L (ref 98–111)
Creatinine, Ser: 0.86 mg/dL (ref 0.44–1.00)
GFR, Estimated: >60 mL/min (ref >60)
Glucose, Bld: 106 mg/dL — ABNORMAL HIGH (ref 70–99)
Potassium: 3.9 mmol/L (ref 3.5–5.1)
Sodium: 139 mmol/L (ref 135–145)
Total Bilirubin: 0.1 mg/dL — ABNORMAL LOW (ref 0.3–1.2)
Total Protein: 7.2 g/dL (ref 6.5–8.1)

## 2022-07-28 LAB — LACTATE DEHYDROGENASE: LDH: 175 U/L (ref 98–192)

## 2022-07-28 LAB — FOLATE: Folate: 15.7 ng/mL (ref >5.9)

## 2022-07-28 LAB — IRON AND TIBC
Iron: 28 ug/dL (ref 28–170)
Saturation Ratios: 5 % — ABNORMAL LOW (ref 10.4–31.8)
TIBC: 548 ug/dL — ABNORMAL HIGH (ref 250–450)
UIBC: 520 ug/dL

## 2022-07-28 LAB — FERRITIN: Ferritin: 8 ng/mL — ABNORMAL LOW (ref 11–307)

## 2022-07-28 LAB — RETICULOCYTES
Immature Retic Fract: 23.6 % — ABNORMAL HIGH (ref 2.3–15.9)
RBC.: 4.57 MIL/uL (ref 3.87–5.11)
Retic Count, Absolute: 69.5 10*3/uL (ref 19.0–186.0)
Retic Ct Pct: 1.5 % (ref 0.4–3.1)

## 2022-07-28 LAB — C-REACTIVE PROTEIN: CRP: 2.3 mg/dL — ABNORMAL HIGH (ref <1.0)

## 2022-07-28 LAB — VITAMIN B12: Vitamin B-12: 216 pg/mL (ref 180–914)

## 2022-07-28 LAB — SEDIMENTATION RATE: Sed Rate: 30 mm/hr — ABNORMAL HIGH (ref 0–22)

## 2022-07-28 MED ORDER — ACETAMINOPHEN 325 MG PO TABS: 650.0000 mg | ORAL_TABLET | Freq: Once | ORAL | Status: DC

## 2022-07-28 MED ORDER — SODIUM CHLORIDE 0.9 % IV SOLN: Freq: Once | INTRAVENOUS | Status: AC

## 2022-07-28 MED ORDER — LORATADINE 10 MG PO TABS
10.0000 mg | ORAL_TABLET | Freq: Once | ORAL | Status: AC
  Administered 2022-07-28: 10 mg via ORAL
  Filled 2022-07-28: qty 1

## 2022-07-28 MED ORDER — SODIUM CHLORIDE 0.9 % IV SOLN
300.0000 mg | Freq: Once | INTRAVENOUS | Status: AC
  Administered 2022-07-28: 300 mg via INTRAVENOUS
  Filled 2022-07-28: qty 200

## 2022-07-28 NOTE — Patient Instructions (Signed)
[facility]  Discharge Instructions: Thank you for choosing [facility] to provide your oncology and hematology care.  If you have a lab appointment with the [facility], please come in thru the Main Entrance and check in at the main information desk.  Wear comfortable clothing and clothing appropriate for easy access to any Portacath or PICC line.   We strive to give you quality time with your provider. You may need to reschedule your appointment if you arrive late (15 or more minutes).  Arriving late affects you and other patients whose appointments are after yours.  Also, if you miss three or more appointments without notifying the office, you may be dismissed from the clinic at the provider's discretion.      For prescription refill requests, have your pharmacy contact our office and allow 72 hours for refills to be completed.    Today you received the following chemotherapy and/or immunotherapy agents Venofer, return as scheduled.   To help prevent nausea and vomiting after your treatment, we encourage you to take your nausea medication as directed.  BELOW ARE SYMPTOMS THAT SHOULD BE REPORTED IMMEDIATELY: *FEVER GREATER THAN 100.4 F (38 C) OR HIGHER *CHILLS OR SWEATING *NAUSEA AND VOMITING THAT IS NOT CONTROLLED WITH YOUR NAUSEA MEDICATION *UNUSUAL SHORTNESS OF BREATH *UNUSUAL BRUISING OR BLEEDING *URINARY PROBLEMS (pain or burning when urinating, or frequent urination) *BOWEL PROBLEMS (unusual diarrhea, constipation, pain near the anus) TENDERNESS IN MOUTH AND THROAT WITH OR WITHOUT PRESENCE OF ULCERS (sore throat, sores in mouth, or a toothache) UNUSUAL RASH, SWELLING OR PAIN  UNUSUAL VAGINAL DISCHARGE OR ITCHING   Items with * indicate a potential emergency and should be followed up as soon as possible or go to the Emergency Department if any problems should occur.  Please show the CHEMOTHERAPY ALERT CARD or IMMUNOTHERAPY ALERT CARD at  check-in to the Emergency Department and triage nurse.  Should you have questions after your visit or need to cancel or reschedule your appointment, please contact [facility] [phone]  and follow the prompts.  Office hours are 8:00 a.m. to 4:30 p.m. Monday - Friday. Please note that voicemails left after 4:00 p.m. may not be returned until the following business day.  We are closed weekends and major holidays. You have access to a nurse at all times for urgent questions. Please call the main number to the clinic [phone] and follow the prompts.  For any non-urgent questions, you may also contact your provider using MyChart. We now offer e-Visits for anyone 44 and older to request care online for non-urgent symptoms. For details visit [email or web address].   Also download the MyChart app! Go to the app store, search "MyChart", open the app, select [facility], and log in with your MyChart username and password.  Masks are optional in the cancer centers. If you would like for your care team to wear a mask while they are taking care of you, please let them know. You may have one support person who is at least 56 years old accompany you for your appointments.

## 2022-07-28 NOTE — Progress Notes (Signed)
Patient reports taking tylenol at 6:30 this morning. Patient tolerated iron infusion with no complaints voiced. Peripheral IV site clean and dry with good blood return noted before and after infusion. Band aid applied. VSS with discharge and left in satisfactory condition with no s/s of distress noted.

## 2022-07-29 LAB — ANTINUCLEAR ANTIBODIES, IFA: ANA Ab, IFA: NEGATIVE

## 2022-07-30 LAB — RHEUMATOID FACTOR: Rheumatoid fact SerPl-aCnc: 124.9 IU/mL — ABNORMAL HIGH (ref <14.0)

## 2022-08-01 LAB — METHYLMALONIC ACID, SERUM: Methylmalonic Acid, Quantitative: 727 nmol/L — ABNORMAL HIGH (ref 0–378)

## 2022-08-05 ENCOUNTER — Other Ambulatory Visit: Payer: Self-pay | Admitting: Orthopaedic Surgery

## 2022-08-05 ENCOUNTER — Encounter: Payer: Self-pay | Admitting: Orthopaedic Surgery

## 2022-08-05 ENCOUNTER — Other Ambulatory Visit: Payer: Self-pay | Admitting: Physician Assistant

## 2022-08-05 ENCOUNTER — Inpatient Hospital Stay: Payer: Medicare Other | Attending: Hematology

## 2022-08-05 VITALS — BP 118/80 | HR 78 | Temp 97.6°F | Resp 17

## 2022-08-05 DIAGNOSIS — Z79899 Other long term (current) drug therapy: Secondary | ICD-10-CM | POA: Diagnosis not present

## 2022-08-05 DIAGNOSIS — Z8349 Family history of other endocrine, nutritional and metabolic diseases: Secondary | ICD-10-CM | POA: Insufficient documentation

## 2022-08-05 DIAGNOSIS — E559 Vitamin D deficiency, unspecified: Secondary | ICD-10-CM | POA: Diagnosis not present

## 2022-08-05 DIAGNOSIS — E538 Deficiency of other specified B group vitamins: Secondary | ICD-10-CM

## 2022-08-05 DIAGNOSIS — D509 Iron deficiency anemia, unspecified: Secondary | ICD-10-CM

## 2022-08-05 DIAGNOSIS — Z8249 Family history of ischemic heart disease and other diseases of the circulatory system: Secondary | ICD-10-CM | POA: Diagnosis not present

## 2022-08-05 LAB — COPPER, SERUM: Copper: 163 ug/dL — ABNORMAL HIGH (ref 80–158)

## 2022-08-05 MED ORDER — SODIUM CHLORIDE 0.9 % IV SOLN
300.0000 mg | Freq: Once | INTRAVENOUS | Status: AC
  Administered 2022-08-05: 300 mg via INTRAVENOUS
  Filled 2022-08-05: qty 300

## 2022-08-05 MED ORDER — NEEDLES & SYRINGES MISC: 1 refills | Status: AC

## 2022-08-05 MED ORDER — TIZANIDINE HCL 4 MG PO TABS: 4.0000 mg | ORAL_TABLET | Freq: Three times a day (TID) | ORAL | 0 refills | Status: DC | PRN

## 2022-08-05 MED ORDER — TRAMADOL HCL 50 MG PO TABS: 50.0000 mg | ORAL_TABLET | Freq: Four times a day (QID) | ORAL | 0 refills | Status: DC | PRN

## 2022-08-05 MED ORDER — CYANOCOBALAMIN 1000 MCG/ML IJ SOLN: 1000.0000 ug | INTRAMUSCULAR | 1 refills | Status: AC

## 2022-08-05 MED ORDER — LORATADINE 10 MG PO TABS
10.0000 mg | ORAL_TABLET | Freq: Once | ORAL | Status: AC
  Administered 2022-08-05: 10 mg via ORAL
  Filled 2022-08-05: qty 1

## 2022-08-05 MED ORDER — SODIUM CHLORIDE 0.9 % IV SOLN: Freq: Once | INTRAVENOUS | Status: AC

## 2022-08-05 MED ORDER — ACETAMINOPHEN 325 MG PO TABS
650.0000 mg | ORAL_TABLET | Freq: Once | ORAL | Status: AC
  Administered 2022-08-05: 650 mg via ORAL
  Filled 2022-08-05: qty 2

## 2022-08-05 NOTE — Progress Notes (Signed)
Labs from 07/18/2022 show vitamin B12 deficiency.  Patient previously did well on self-administered B12 injections every 3 weeks at home.  Prescription sent to pharmacy as requested.  Patient also notes severe joint pain with multiple joint replacements; was found to have significantly elevated rheumatoid factor at 124.9.  We will place referral to rheumatology.  Harriett Rush, PA-C 08/05/22 1:50 PM

## 2022-08-05 NOTE — Patient Instructions (Signed)
[facility]  Discharge Instructions: Thank you for choosing [facility] to provide your oncology and hematology care.  If you have a lab appointment with the Cancer Center, please come in thru the Main Entrance and check in at the main information desk.  Wear comfortable clothing and clothing appropriate for easy access to any Portacath or PICC line.   We strive to give you quality time with your provider. You may need to reschedule your appointment if you arrive late (15 or more minutes).  Arriving late affects you and other patients whose appointments are after yours.  Also, if you miss three or more appointments without notifying the office, you may be dismissed from the clinic at the provider's discretion.      For prescription refill requests, have your pharmacy contact our office and allow 72 hours for refills to be completed.     To help prevent nausea and vomiting after your treatment, we encourage you to take your nausea medication as directed.  BELOW ARE SYMPTOMS THAT SHOULD BE REPORTED IMMEDIATELY: *FEVER GREATER THAN 100.4 F (38 C) OR HIGHER *CHILLS OR SWEATING *NAUSEA AND VOMITING THAT IS NOT CONTROLLED WITH YOUR NAUSEA MEDICATION *UNUSUAL SHORTNESS OF BREATH *UNUSUAL BRUISING OR BLEEDING *URINARY PROBLEMS (pain or burning when urinating, or frequent urination) *BOWEL PROBLEMS (unusual diarrhea, constipation, pain near the anus) TENDERNESS IN MOUTH AND THROAT WITH OR WITHOUT PRESENCE OF ULCERS (sore throat, sores in mouth, or a toothache) UNUSUAL RASH, SWELLING OR PAIN  UNUSUAL VAGINAL DISCHARGE OR ITCHING   Items with * indicate a potential emergency and should be followed up as soon as possible or go to the Emergency Department if any problems should occur.  Please show the CHEMOTHERAPY ALERT CARD or IMMUNOTHERAPY ALERT CARD at check-in to the Emergency Department and triage nurse.  Should you have questions after your visit or need to  cancel or reschedule your appointment, please contact [facility] [phone]  and follow the prompts.  Office hours are 8:00 a.m. to 4:30 p.m. Monday - Friday. Please note that voicemails left after 4:00 p.m. may not be returned until the following business day.  We are closed weekends and major holidays. You have access to a nurse at all times for urgent questions. Please call the main number to the clinic [phone] and follow the prompts.  For any non-urgent questions, you may also contact your provider using MyChart. We now offer e-Visits for anyone 18 and older to request care online for non-urgent symptoms. For details visit [email or web address].   Also download the MyChart app! Go to the app store, search "MyChart", open the app, select [facility], and log in with your MyChart username and password.  Masks are optional in the cancer centers. If you would like for your care team to wear a mask while they are taking care of you, please let them know. You may have one support person who is at least 56 years old accompany you for your appointments.  

## 2022-08-05 NOTE — Progress Notes (Signed)
Patient presents today for iron infusion.  Patient is in satisfactory condition with no new complaints voiced.  Vital signs are stable.  We will proceed with infusion per provider orders.   Patient tolerated treatment well with no complaints voiced.  Patient left ambulatory in stable condition.  Vital signs stable at discharge.  Follow up as scheduled.    

## 2022-08-08 ENCOUNTER — Other Ambulatory Visit: Payer: Self-pay

## 2022-08-08 DIAGNOSIS — M2550 Pain in unspecified joint: Secondary | ICD-10-CM

## 2022-08-08 DIAGNOSIS — C9000 Multiple myeloma not having achieved remission: Secondary | ICD-10-CM

## 2022-08-08 DIAGNOSIS — D500 Iron deficiency anemia secondary to blood loss (chronic): Secondary | ICD-10-CM

## 2022-08-08 DIAGNOSIS — E538 Deficiency of other specified B group vitamins: Secondary | ICD-10-CM

## 2022-08-08 DIAGNOSIS — R768 Other specified abnormal immunological findings in serum: Secondary | ICD-10-CM

## 2022-08-12 ENCOUNTER — Inpatient Hospital Stay: Payer: Medicare Other

## 2022-08-12 ENCOUNTER — Encounter: Payer: Self-pay | Admitting: Hematology

## 2022-08-12 VITALS — BP 122/57 | HR 76 | Temp 98.3°F | Resp 17

## 2022-08-12 DIAGNOSIS — Z8249 Family history of ischemic heart disease and other diseases of the circulatory system: Secondary | ICD-10-CM | POA: Diagnosis not present

## 2022-08-12 DIAGNOSIS — E559 Vitamin D deficiency, unspecified: Secondary | ICD-10-CM | POA: Diagnosis not present

## 2022-08-12 DIAGNOSIS — D509 Iron deficiency anemia, unspecified: Secondary | ICD-10-CM | POA: Diagnosis not present

## 2022-08-12 DIAGNOSIS — Z8349 Family history of other endocrine, nutritional and metabolic diseases: Secondary | ICD-10-CM | POA: Diagnosis not present

## 2022-08-12 DIAGNOSIS — Z79899 Other long term (current) drug therapy: Secondary | ICD-10-CM | POA: Diagnosis not present

## 2022-08-12 DIAGNOSIS — E538 Deficiency of other specified B group vitamins: Secondary | ICD-10-CM | POA: Diagnosis not present

## 2022-08-12 MED ORDER — SODIUM CHLORIDE 0.9 % IV SOLN
300.0000 mg | Freq: Once | INTRAVENOUS | Status: AC
  Administered 2022-08-12: 300 mg via INTRAVENOUS
  Filled 2022-08-12: qty 300

## 2022-08-12 MED ORDER — ACETAMINOPHEN 325 MG PO TABS
650.0000 mg | ORAL_TABLET | Freq: Once | ORAL | Status: AC
  Administered 2022-08-12: 650 mg via ORAL
  Filled 2022-08-12: qty 2

## 2022-08-12 MED ORDER — LORATADINE 10 MG PO TABS
10.0000 mg | ORAL_TABLET | Freq: Once | ORAL | Status: AC
  Administered 2022-08-12: 10 mg via ORAL
  Filled 2022-08-12: qty 1

## 2022-08-12 MED ORDER — SODIUM CHLORIDE 0.9 % IV SOLN: Freq: Once | INTRAVENOUS | Status: AC

## 2022-08-12 NOTE — Patient Instructions (Signed)
[facility]  Discharge Instructions: Thank you for choosing [facility] to provide your oncology and hematology care.  If you have a lab appointment with the [facility], please come in thru the Main Entrance and check in at the main information desk.  Wear comfortable clothing and clothing appropriate for easy access to any Portacath or PICC line.   We strive to give you quality time with your provider. You may need to reschedule your appointment if you arrive late (15 or more minutes).  Arriving late affects you and other patients whose appointments are after yours.  Also, if you miss three or more appointments without notifying the office, you may be dismissed from the clinic at the provider's discretion.      For prescription refill requests, have your pharmacy contact our office and allow 72 hours for refills to be completed.    Iron Sucrose Injection What is this medication? IRON SUCROSE (EYE ern SOO krose) treats low levels of iron (iron deficiency anemia) in people with kidney disease. Iron is a mineral that plays an important role in making red blood cells, which carry oxygen from your lungs to the rest of your body. This medicine may be used for other purposes; ask your health care provider or pharmacist if you have questions. COMMON BRAND NAME(S): Venofer What should I tell my care team before I take this medication? They need to know if you have any of these conditions: Anemia not caused by low iron levels Heart disease High levels of iron in the blood Kidney disease Liver disease An unusual or allergic reaction to iron, other medications, foods, dyes, or preservatives Pregnant or trying to get pregnant Breastfeeding How should I use this medication? This medication is for infusion into a vein. It is given in a hospital or clinic setting. Talk to your care team about the use of this medication in children. While this medication may be  prescribed for children as young as 2 years for selected conditions, precautions do apply. Overdosage: If you think you have taken too much of this medicine contact a poison control center or emergency room at once. NOTE: This medicine is only for you. Do not share this medicine with others. What if I miss a dose? Keep appointments for follow-up doses. It is important not to miss your dose. Call your care team if you are unable to keep an appointment. What may interact with this medication? Do not take this medication with any of the following: Deferoxamine Dimercaprol Other iron products This medication may also interact with the following: Chloramphenicol Deferasirox This list may not describe all possible interactions. Give your health care provider a list of all the medicines, herbs, non-prescription drugs, or dietary supplements you use. Also tell them if you smoke, drink alcohol, or use illegal drugs. Some items may interact with your medicine. What should I watch for while using this medication? Visit your care team regularly. Tell your care team if your symptoms do not start to get better or if they get worse. You may need blood work done while you are taking this medication. You may need to follow a special diet. Talk to your care team. Foods that contain iron include: whole grains/cereals, dried fruits, beans, or peas, leafy green vegetables, and organ meats (liver, kidney). What side effects may I notice from receiving this medication? Side effects that you should report to your care team as soon as possible: Allergic reactions--skin rash, itching, hives,  swelling of the face, lips, tongue, or throat Low blood pressure--dizziness, feeling faint or lightheaded, blurry vision Shortness of breath Side effects that usually do not require medical attention (report to your care team if they continue or are bothersome): Flushing Headache Joint pain Muscle pain Nausea Pain, redness, or  irritation at injection site This list may not describe all possible side effects. Call your doctor for medical advice about side effects. You may report side effects to FDA at 1-800-FDA-1088. Where should I keep my medication? This medication is given in a hospital or clinic and will not be stored at home. NOTE: This sheet is a summary. It may not cover all possible information. If you have questions about this medicine, talk to your doctor, pharmacist, or health care provider.  2023 Elsevier/Gold Standard (2020-11-26 00:00:00)    To help prevent nausea and vomiting after your treatment, we encourage you to take your nausea medication as directed.  BELOW ARE SYMPTOMS THAT SHOULD BE REPORTED IMMEDIATELY: *FEVER GREATER THAN 100.4 F (38 C) OR HIGHER *CHILLS OR SWEATING *NAUSEA AND VOMITING THAT IS NOT CONTROLLED WITH YOUR NAUSEA MEDICATION *UNUSUAL SHORTNESS OF BREATH *UNUSUAL BRUISING OR BLEEDING *URINARY PROBLEMS (pain or burning when urinating, or frequent urination) *BOWEL PROBLEMS (unusual diarrhea, constipation, pain near the anus) TENDERNESS IN MOUTH AND THROAT WITH OR WITHOUT PRESENCE OF ULCERS (sore throat, sores in mouth, or a toothache) UNUSUAL RASH, SWELLING OR PAIN  UNUSUAL VAGINAL DISCHARGE OR ITCHING   Items with * indicate a potential emergency and should be followed up as soon as possible or go to the Emergency Department if any problems should occur.  Please show the CHEMOTHERAPY ALERT CARD or IMMUNOTHERAPY ALERT CARD at check-in to the Emergency Department and triage nurse.  Should you have questions after your visit or need to cancel or reschedule your appointment, please contact [facility] [phone]  and follow the prompts.  Office hours are 8:00 a.m. to 4:30 p.m. Monday - Friday. Please note that voicemails left after 4:00 p.m. may not be returned until the following business day.  We are closed weekends and major holidays. You have access to  a nurse at all times for urgent questions. Please call the main number to the clinic [phone] and follow the prompts.  For any non-urgent questions, you may also contact your provider using MyChart. We now offer e-Visits for anyone 104 and older to request care online for non-urgent symptoms. For details visit [email or web address].   Also download the MyChart app! Go to the app store, search "MyChart", open the app, select [facility], and log in with your MyChart username and password.  Masks are optional in the cancer centers. If you would like for your care team to wear a mask while they are taking care of you, please let them know. You may have one support person who is at least 56 years old accompany you for your appointments.

## 2022-08-12 NOTE — Progress Notes (Signed)
Patient presents today for iron infusion.  Patient is in satisfactory condition with no complaints voiced.  Vital signs are stable.  We will proceed with infusion per provider orders.  Patient tolerated infusion well with no complaints voiced.  Patient left ambulatory in stable condition.  Vital signs stable at discharge.  Follow up as scheduled.

## 2022-08-15 ENCOUNTER — Inpatient Hospital Stay: Payer: Medicare Other

## 2022-08-19 ENCOUNTER — Inpatient Hospital Stay: Payer: Medicare Other

## 2022-08-24 ENCOUNTER — Other Ambulatory Visit: Payer: Self-pay | Admitting: Orthopaedic Surgery

## 2022-08-24 MED ORDER — TRAMADOL HCL 50 MG PO TABS: 50.0000 mg | ORAL_TABLET | Freq: Four times a day (QID) | ORAL | 0 refills | Status: DC | PRN

## 2022-08-24 MED ORDER — TIZANIDINE HCL 4 MG PO TABS: 4.0000 mg | ORAL_TABLET | Freq: Three times a day (TID) | ORAL | 0 refills | Status: DC | PRN

## 2022-09-09 ENCOUNTER — Other Ambulatory Visit: Payer: Self-pay | Admitting: Orthopaedic Surgery

## 2022-09-09 ENCOUNTER — Other Ambulatory Visit: Payer: Self-pay

## 2022-09-09 DIAGNOSIS — D509 Iron deficiency anemia, unspecified: Secondary | ICD-10-CM

## 2022-09-11 ENCOUNTER — Other Ambulatory Visit: Payer: Self-pay | Admitting: Orthopaedic Surgery

## 2022-09-12 ENCOUNTER — Inpatient Hospital Stay: Payer: Medicare Other | Attending: Hematology

## 2022-09-12 DIAGNOSIS — D509 Iron deficiency anemia, unspecified: Secondary | ICD-10-CM | POA: Insufficient documentation

## 2022-09-12 LAB — CBC WITH DIFFERENTIAL/PLATELET
Abs Immature Granulocytes: 0.01 10*3/uL (ref 0.00–0.07)
Basophils Absolute: 0 10*3/uL (ref 0.0–0.1)
Basophils Relative: 0 %
Eosinophils Absolute: 0 10*3/uL (ref 0.0–0.5)
Eosinophils Relative: 1 %
HCT: 42 % (ref 36.0–46.0)
Hemoglobin: 13.1 g/dL (ref 12.0–15.0)
Immature Granulocytes: 0 %
Lymphocytes Relative: 25 %
Lymphs Abs: 1.7 10*3/uL (ref 0.7–4.0)
MCH: 27.1 pg (ref 26.0–34.0)
MCHC: 31.2 g/dL (ref 30.0–36.0)
MCV: 86.8 fL (ref 80.0–100.0)
Monocytes Absolute: 0.5 10*3/uL (ref 0.1–1.0)
Monocytes Relative: 7 %
Neutro Abs: 4.6 10*3/uL (ref 1.7–7.7)
Neutrophils Relative %: 67 %
Platelets: 309 10*3/uL (ref 150–400)
RBC: 4.84 MIL/uL (ref 3.87–5.11)
RDW: 23.9 % — ABNORMAL HIGH (ref 11.5–15.5)
WBC: 6.9 10*3/uL (ref 4.0–10.5)
nRBC: 0 % (ref 0.0–0.2)

## 2022-09-12 LAB — IRON AND TIBC
Iron: 49 ug/dL (ref 28–170)
Saturation Ratios: 11 % (ref 10.4–31.8)
TIBC: 453 ug/dL — ABNORMAL HIGH (ref 250–450)
UIBC: 404 ug/dL

## 2022-09-12 LAB — FERRITIN: Ferritin: 96 ng/mL (ref 11–307)

## 2022-09-12 MED ORDER — TIZANIDINE HCL 4 MG PO TABS: 4.0000 mg | ORAL_TABLET | Freq: Three times a day (TID) | ORAL | 0 refills | Status: DC | PRN

## 2022-09-12 MED ORDER — TRAMADOL HCL 50 MG PO TABS: 50.0000 mg | ORAL_TABLET | Freq: Four times a day (QID) | ORAL | 0 refills | Status: DC | PRN

## 2022-09-13 ENCOUNTER — Encounter: Payer: Self-pay | Admitting: Physical Medicine & Rehabilitation

## 2022-09-13 ENCOUNTER — Encounter: Payer: Self-pay | Admitting: Orthopaedic Surgery

## 2022-09-13 NOTE — Telephone Encounter (Signed)
Spoke to patient about her continued pain that is in the right lumbar region.  Her buttock pain has improved since the sacroiliac radiofrequency neurotomies.  Right-sided low back pain

## 2022-09-13 NOTE — Telephone Encounter (Signed)
Called patient regarding her phone call.  The patient clarified that she has had good relief of her buttock pain bilaterally after sacroiliac RF done on the right side in September and on the left side last month.  Her main complaint at this time is right lumbar pain.  Her pain is greater than 5 and has been present for greater than 3 months.  She has tried nonsteroidal anti-inflammatories as well as physical therapy within the last year and these have not been helpful.  She has no pain that is shooting down her right leg or causing numbness in her right foot. We discussed that this may be lumbar facet mediated pain and that she would benefit from lumbar medial branch blocks.  Will schedule this in February.  Plan on right L1 L2-L3-L4 medial branch blocks.

## 2022-09-19 ENCOUNTER — Inpatient Hospital Stay: Payer: Medicare Other | Admitting: Physician Assistant

## 2022-09-19 ENCOUNTER — Other Ambulatory Visit: Payer: 59

## 2022-09-27 ENCOUNTER — Other Ambulatory Visit: Payer: Self-pay | Admitting: Orthopaedic Surgery

## 2022-09-27 ENCOUNTER — Inpatient Hospital Stay: Payer: Medicare Other | Admitting: Hematology

## 2022-09-27 MED ORDER — TIZANIDINE HCL 4 MG PO TABS: 4.0000 mg | ORAL_TABLET | Freq: Three times a day (TID) | ORAL | 0 refills | Status: DC | PRN

## 2022-09-27 MED ORDER — TRAMADOL HCL 50 MG PO TABS: 50.0000 mg | ORAL_TABLET | Freq: Four times a day (QID) | ORAL | 0 refills | Status: DC | PRN

## 2022-09-27 NOTE — Progress Notes (Deleted)
Office Visit Note  Patient: Kaylee Salas             Date of Birth: November 12, 1965           MRN: [id number]             PCP: Emelda Fear, DO Referring: Harriett Rush, * Visit Date: 10/11/2022 Occupation: '@GUAROCC'$ @  Subjective:  No chief complaint on file.   History of Present Illness: Kaylee Salas is a 57 y.o. female ***     Activities of Daily Living:  Patient reports morning stiffness for *** {minute/hour:19697}.   Patient {ACTIONS;DENIES/REPORTS:21021675::"Denies"} nocturnal pain.  Difficulty dressing/grooming: {ACTIONS;DENIES/REPORTS:21021675::"Denies"} Difficulty climbing stairs: {ACTIONS;DENIES/REPORTS:21021675::"Denies"} Difficulty getting out of chair: {ACTIONS;DENIES/REPORTS:21021675::"Denies"} Difficulty using hands for taps, buttons, cutlery, and/or writing: {ACTIONS;DENIES/REPORTS:21021675::"Denies"}  No Rheumatology ROS completed.   PMFS History:  Patient Active Problem List   Diagnosis Date Noted   Iron deficiency anemia 07/18/2022   OA (osteoarthritis) of knee 02/08/2022   Status post total right knee replacement 02/08/2022   Abnormal thyroid blood test 10/15/2021   Unilateral primary osteoarthritis, right hip 03/24/2021   Unilateral primary osteoarthritis, right knee 05/27/2020   Unilateral primary osteoarthritis, left hip 10/30/2018   Status post total replacement of left hip 10/30/2018    Past Medical History:  Diagnosis Date   Anemia    Anxiety    Arthritis    Complication of anesthesia    "woke up panicking/fighting"   Depression    Osteoporosis    PONV (postoperative nausea and vomiting)    Sacroiliac joint disease    Unilateral primary osteoarthritis, left hip 10/30/2018    Family History  Problem Relation Age of Onset   Hypertension Mother    Hyperlipidemia Mother    Heart attack Mother    Heart attack Father    Hyperlipidemia Father    Hypertension Father    Heart failure Father    Past Surgical History:  Procedure  Laterality Date   CHOLECYSTECTOMY  2004   GASTRIC BYPASS     HAND SURGERY Right    ring finger reconstruction   HERNIA REPAIR     KNEE ARTHROSCOPY W/ ACL RECONSTRUCTION Right 1997   TOTAL HIP ARTHROPLASTY Left 10/30/2018   Procedure: LEFT TOTAL HIP ARTHROPLASTY ANTERIOR APPROACH;  Surgeon: Mcarthur Rossetti, MD;  Location: [facility];  Service: Orthopedics;  Laterality: Left;   TOTAL KNEE ARTHROPLASTY Right 02/08/2022   Procedure: RIGHT TOTAL KNEE ARTHROPLASTY;  Surgeon: Mcarthur Rossetti, MD;  Location: [facility];  Service: Orthopedics;  Laterality: Right;   UMBILICAL HERNIA REPAIR  2004   Social History   Social History Narrative   Not on file    There is no immunization history on file for this patient.   Objective: Vital Signs: There were no vitals taken for this visit.   Physical Exam   Musculoskeletal Exam: ***  CDAI Exam: CDAI Score: -- Patient Global: --; Provider Global: -- Swollen: --; Tender: -- Joint Exam 10/11/2022   No joint exam has been documented for this visit   There is currently no information documented on the homunculus. Go to the Rheumatology activity and complete the homunculus joint exam.  Investigation: No additional findings.  Imaging: No results found.  Recent Labs: Lab Results  Component Value Date   WBC 6.9 09/12/2022   HGB 13.1 09/12/2022   PLT 309 09/12/2022   NA 139 07/28/2022   K 3.9 07/28/2022   CL 104 07/28/2022   CO2 26 07/28/2022   GLUCOSE 106 (  H) 07/28/2022   BUN 15 07/28/2022   CREATININE 0.86 07/28/2022   BILITOT 0.1 (L) 07/28/2022   ALKPHOS 121 07/28/2022   AST 22 07/28/2022   ALT 15 07/28/2022   PROT 7.2 07/28/2022   ALBUMIN 3.7 07/28/2022   CALCIUM 8.6 (L) 07/28/2022   GFRAA >60 10/31/2018    Speciality Comments: No specialty comments available.  Procedures:  No procedures performed Allergies: Patient has no known allergies.   Assessment / Plan:     Visit Diagnoses: Rheumatoid factor positive -  07/28/22: RF 124.9, ANA negative, CRP 2.3, ESR 30  Polyarthralgia  Status post total right knee replacement  Status post total replacement of left hip  Unilateral primary osteoarthritis, left hip  Orders: No orders of the defined types were placed in this encounter.  No orders of the defined types were placed in this encounter.   Face-to-face time spent with patient was *** minutes. Greater than 50% of time was spent in counseling and coordination of care.  Follow-Up Instructions: No follow-ups on file.   Kaylee Neas, PA-C  Note - This record has been created using Dragon software.  Chart creation errors have been sought, but may not always  have been located. Such creation errors do not reflect on  the standard of medical care.

## 2022-09-28 ENCOUNTER — Encounter: Payer: Self-pay | Admitting: Hematology

## 2022-09-29 NOTE — Pre-Procedure Instructions (Signed)
Surgical Instructions    Your procedure is scheduled on Tuesday, February 6th.  Report to [facility] Main Entrance "A" at 11:45 A.M., then check in with the Admitting office.  Call this number if you have problems the morning of surgery:  [phone]  If you have any questions prior to your surgery date call [phone]: Open Monday-Friday 8am-4pm If you experience any cold or flu symptoms such as cough, fever, chills, shortness of breath, etc. between now and your scheduled surgery, please notify us at the above number.     Remember:  Do not eat after midnight the night before your surgery  You may drink clear liquids until 10:45 AM the morning of your surgery.   Clear liquids allowed are: Water, Non-Citrus Juices (without pulp), Carbonated Beverages, Clear Tea, Black Coffee Only (NO MILK, CREAM OR POWDERED CREAMER of any kind), and Gatorade.   Patient Instructions  The night before surgery:  No food after midnight. ONLY clear liquids after midnight  The day of surgery (if you do NOT have diabetes):  Drink ONE (1) Pre-Surgery Clear Ensure by 10:45 AM the morning of surgery. Drink in one sitting. Do not sip.  This drink was given to you during your hospital  pre-op appointment visit.  Nothing else to drink after completing the  Pre-Surgery Clear Ensure.          If you have questions, please contact your surgeon's office.     Take these medicines the morning of surgery with A SIP OF WATER  DULoxetine (CYMBALTA)  hydrOXYzine (ATARAX/VISTARIL)   If needed: ondansetron (ZOFRAN-ODT)  tiZANidine (ZANAFLEX)  traMADol (ULTRAM)    As of today, STOP taking any Aspirin (unless otherwise instructed by your surgeon) Aleve, Naproxen, Ibuprofen, Motrin, Advil, Goody's, BC's, all herbal medications, fish oil, and all vitamins. This includes diclofenac Sodium (VOLTAREN) 1 % GEL.                     Do NOT Smoke (Tobacco/Vaping) for 24 hours prior to your procedure.  If you use a  CPAP at night, you may bring your mask/headgear for your overnight stay.   Contacts, glasses, piercing's, hearing aid's, dentures or partials may not be worn into surgery, please bring cases for these belongings.    For patients admitted to the hospital, discharge time will be determined by your treatment team.   Patients discharged the day of surgery will not be allowed to drive home, and someone needs to stay with them for 24 hours.  SURGICAL WAITING ROOM VISITATION Patients having surgery or a procedure may have no more than 2 support people in the waiting area - these visitors may rotate.   Children under the age of 26 must have an adult with them who is not the patient. If the patient needs to stay at the hospital during part of their recovery, the visitor guidelines for inpatient rooms apply. Pre-op nurse will coordinate an appropriate time for 1 support person to accompany patient in pre-op.  This support person may not rotate.   Please refer to the [facility] website for the visitor guidelines for Inpatients (after your surgery is over and you are in a regular room).    Special instructions:   [facility]- Preparing For Surgery  Before surgery, you can play an important role. Because skin is not sterile, your skin needs to be as free of germs as possible. You can reduce the number of germs on your skin by washing with CHG (  chlorahexidine gluconate) Soap before surgery.  CHG is an antiseptic cleaner which kills germs and bonds with the skin to continue killing germs even after washing.    Oral Hygiene is also important to reduce your risk of infection.  Remember - BRUSH YOUR TEETH THE MORNING OF SURGERY WITH YOUR REGULAR TOOTHPASTE  Please do not use if you have an allergy to CHG or antibacterial soaps. If your skin becomes reddened/irritated stop using the CHG.  Do not shave (including legs and underarms) for at least 48 hours prior to first CHG shower. It is OK to shave your  face.  Please follow these instructions carefully.   Shower the NIGHT BEFORE SURGERY and the MORNING OF SURGERY  If you chose to wash your hair, wash your hair first as usual with your normal shampoo.  After you shampoo, rinse your hair and body thoroughly to remove the shampoo.  Use CHG Soap as you would any other liquid soap. You can apply CHG directly to the skin and wash gently with a scrungie or a clean washcloth.   Apply the CHG Soap to your body ONLY FROM THE NECK DOWN.  Do not use on open wounds or open sores. Avoid contact with your eyes, ears, mouth and genitals (private parts). Wash Face and genitals (private parts)  with your normal soap.   Wash thoroughly, paying special attention to the area where your surgery will be performed.  Thoroughly rinse your body with warm water from the neck down.  DO NOT shower/wash with your normal soap after using and rinsing off the CHG Soap.  Pat yourself dry with a CLEAN TOWEL.  Wear CLEAN PAJAMAS to bed the night before surgery  Place CLEAN SHEETS on your bed the night before your surgery  DO NOT SLEEP WITH PETS.   Day of Surgery: Take a shower with CHG soap. Do not wear jewelry or makeup Do not wear lotions, powders, perfumes, or deodorant. Do not shave 48 hours prior to surgery.   Do not bring valuables to the hospital. [facility] is not responsible for any belongings or valuables. Do not wear nail polish, gel polish, artificial nails, or any other type of covering on natural nails (fingers and toes) If you have artificial nails or gel coating that need to be removed by a nail salon, please have this removed prior to surgery. Artificial nails or gel coating may interfere with anesthesia's ability to adequately monitor your vital signs. Wear Clean/Comfortable clothing the morning of surgery Remember to brush your teeth WITH YOUR REGULAR TOOTHPASTE.   Please read over the following fact sheets that you were given.    If you  received a COVID test during your pre-op visit  it is requested that you wear a mask when out in public, stay away from anyone that may not be feeling well and notify your surgeon if you develop symptoms. If you have been in contact with anyone that has tested positive in the last 10 days please notify you surgeon.

## 2022-09-30 ENCOUNTER — Encounter (HOSPITAL_COMMUNITY): Payer: Self-pay

## 2022-09-30 ENCOUNTER — Encounter (HOSPITAL_COMMUNITY)
Admission: RE | Admit: 2022-09-30 | Discharge: 2022-09-30 | Disposition: A | Discharge status: Home / Self Care | Payer: Medicare Other | Source: Ambulatory Visit | Attending: Orthopaedic Surgery | Admitting: Orthopaedic Surgery

## 2022-09-30 ENCOUNTER — Other Ambulatory Visit: Payer: Self-pay

## 2022-09-30 VITALS — BP 115/64 | HR 68 | Temp 97.5°F | Resp 18 | Ht 61.0 in | Wt 188.3 lb

## 2022-09-30 DIAGNOSIS — Z9884 Bariatric surgery status: Secondary | ICD-10-CM | POA: Diagnosis not present

## 2022-09-30 DIAGNOSIS — M1611 Unilateral primary osteoarthritis, right hip: Secondary | ICD-10-CM | POA: Insufficient documentation

## 2022-09-30 DIAGNOSIS — Z96652 Presence of left artificial knee joint: Secondary | ICD-10-CM | POA: Diagnosis not present

## 2022-09-30 DIAGNOSIS — D649 Anemia, unspecified: Secondary | ICD-10-CM | POA: Insufficient documentation

## 2022-09-30 DIAGNOSIS — Z01812 Encounter for preprocedural laboratory examination: Secondary | ICD-10-CM | POA: Diagnosis present

## 2022-09-30 DIAGNOSIS — Z96642 Presence of left artificial hip joint: Secondary | ICD-10-CM | POA: Insufficient documentation

## 2022-09-30 DIAGNOSIS — Z01818 Encounter for other preprocedural examination: Secondary | ICD-10-CM

## 2022-09-30 LAB — CBC
HCT: 37.7 % (ref 36.0–46.0)
Hemoglobin: 12.1 g/dL (ref 12.0–15.0)
MCH: 27.9 pg (ref 26.0–34.0)
MCHC: 32.1 g/dL (ref 30.0–36.0)
MCV: 87.1 fL (ref 80.0–100.0)
Platelets: 273 10*3/uL (ref 150–400)
RBC: 4.33 MIL/uL (ref 3.87–5.11)
RDW: 21.8 % — ABNORMAL HIGH (ref 11.5–15.5)
WBC: 7 10*3/uL (ref 4.0–10.5)
nRBC: 0 % (ref 0.0–0.2)

## 2022-09-30 LAB — TYPE AND SCREEN
ABO/RH(D): A NEG
Antibody Screen: NEGATIVE

## 2022-09-30 LAB — SURGICAL PCR SCREEN
MRSA, PCR: POSITIVE — AB
Staphylococcus aureus: POSITIVE — AB

## 2022-09-30 NOTE — Progress Notes (Signed)
IB message sent to MD with notification of positive Staph and MRSA results on 09/30/22.  Surgery is scheduled at [facility] on 10/04/22.

## 2022-09-30 NOTE — Progress Notes (Signed)
PCP - Dr. Emelda Fear Cardiologist - denies  PPM/ICD - denies   Chest x-ray - 09/14/21 EKG - 09/14/21- records requested Stress Test - 2003 per pt, normal per pt ECHO - denies Cardiac Cath - denies  Sleep Study - denies   DM- denies    ASA/Blood Thinner Instructions: n/a   ERAS Protcol - yes PRE-SURGERY Ensure given at PAT  COVID TEST- n/a   Anesthesia review: yes, records requested from PCP  Patient denies shortness of breath, fever, cough and chest pain at PAT appointment   All instructions explained to the patient, with a verbal understanding of the material. Patient agrees to go over the instructions while at home for a better understanding. The opportunity to ask questions was provided.

## 2022-10-03 ENCOUNTER — Telehealth: Payer: Self-pay | Admitting: *Deleted

## 2022-10-03 NOTE — H&P (Signed)
TOTAL HIP ADMISSION H&P  Patient is admitted for right total hip arthroplasty.  Subjective:  Chief Complaint: right hip pain  HPI: Kaylee Salas, 57 y.o. female, has a history of pain and functional disability in the right hip(s) due to arthritis and patient has failed non-surgical conservative treatments for greater than 12 weeks to include NSAID's and/or analgesics, corticosteriod injections, flexibility and strengthening excercises, supervised PT with diminished ADL's post treatment, use of assistive devices, weight reduction as appropriate, and activity modification.  Onset of symptoms was gradual starting 3 years ago with gradually worsening course since that time.The patient noted no past surgery on the right hip(s).  Patient currently rates pain in the right hip at 10 out of 10 with activity. Patient has night pain, worsening of pain with activity and weight bearing, trendelenberg gait, pain that interfers with activities of daily living, and pain with passive range of motion. Patient has evidence of subchondral sclerosis, periarticular osteophytes, and joint space narrowing by imaging studies. This condition presents safety issues increasing the risk of falls.  There is no current active infection.  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/30/2018   Past Medical History:  Diagnosis Date   Anemia    Anxiety    Arthritis    Complication of anesthesia    "woke up panicking/fighting"   Depression    Osteoporosis    PONV (postoperative nausea and vomiting)    Sacroiliac joint disease    Unilateral primary osteoarthritis, left hip 10/30/2018    Past Surgical History:  Procedure Laterality Date    CHOLECYSTECTOMY  2004   GASTRIC BYPASS     HAND SURGERY Right    ring finger reconstruction   HERNIA REPAIR     KNEE ARTHROSCOPY W/ ACL RECONSTRUCTION Right 1997   TOTAL HIP ARTHROPLASTY Left 10/30/2018   Procedure: LEFT TOTAL HIP ARTHROPLASTY ANTERIOR APPROACH;  Surgeon: Mcarthur Rossetti, MD;  Location: [facility];  Service: Orthopedics;  Laterality: Left;   TOTAL KNEE ARTHROPLASTY Right 02/08/2022   Procedure: RIGHT TOTAL KNEE ARTHROPLASTY;  Surgeon: Mcarthur Rossetti, MD;  Location: [facility];  Service: Orthopedics;  Laterality: Right;   UMBILICAL HERNIA REPAIR  2004    No current facility-administered medications for this encounter.   Current Outpatient Medications  Medication Sig Dispense Refill Last Dose   Aspirin-Acetaminophen-Caffeine (GOODY HEADACHE PO) Take 2 Packages by mouth 2 (two) times daily as needed (headache).      cyanocobalamin (VITAMIN B12) 1000 MCG/ML injection Inject 1 mL (1,000 mcg total) into the muscle every 21 ( twenty-one) days. 8 mL 1    diclofenac Sodium (VOLTAREN) 1 % GEL Apply 2 g topically daily as needed (knee pain).      DULoxetine (CYMBALTA) 60 MG capsule Take 120 mg by mouth daily.      hydrOXYzine (ATARAX/VISTARIL) 25 MG tablet Take 25 mg by mouth 2 (two) times daily.      linaclotide (LINZESS) 290 MCG CAPS capsule Take 290 mcg by mouth daily before breakfast.      Menthol, Topical Analgesic, (ICY HOT MEDICATED SPRAY EX) Apply 1 spray topically daily as needed (pain).      ondansetron (ZOFRAN-ODT) 4 MG disintegrating tablet Take 4 mg by mouth  every 8 (eight) hours as needed for vomiting or nausea.      rOPINIRole (REQUIP) 1 MG tablet Take 1-2 mg by mouth at bedtime as needed (restless leg).      tiZANidine (ZANAFLEX) 4 MG tablet Take 1 tablet (4 mg total) by mouth every 8 (eight) hours as needed for muscle spasms. 30 tablet 0    traMADol (ULTRAM) 50 MG tablet Take 1 tablet (50 mg total) by mouth every 6 (six) hours as needed (pain). 40 tablet 0     ASPIRIN LOW DOSE 81 MG chewable tablet CHEW 1 TABLET (81 MG TOTAL) BY MOUTH 2 (TWO) TIMES DAILY. (Patient not taking: Reported on 09/29/2022) 30 tablet 0 Not Taking   meloxicam (MOBIC) 15 MG tablet TAKE 1 TABLET BY MOUTH EVERY DAY AS NEEDED FOR PAIN (Patient not taking: Reported on 09/29/2022) 30 tablet 3 Not Taking   Needles & Syringes MISC Please dispense adequate needle and syringe supplies for patient to self administer vitamin B12 injection every 3 weeks at home. 12 Units 1    No Known Allergies  Social History   Tobacco Use   Smoking status: Never   Smokeless tobacco: Never  Substance Use Topics   Alcohol use: Not Currently    Family History  Problem Relation Age of Onset   Hypertension Mother    Hyperlipidemia Mother    Heart attack Mother    Heart attack Father    Hyperlipidemia Father    Hypertension Father    Heart failure Father      Review of Systems  All other systems reviewed and are negative.   Objective:  Physical Exam Vitals reviewed.  Constitutional:      Appearance: Normal appearance. She is obese.  HENT:     Head: Normocephalic and atraumatic.  Eyes:     Extraocular Movements: Extraocular movements intact.     Pupils: Pupils are equal, round, and reactive to light.  Cardiovascular:     Rate and Rhythm: Normal rate and regular rhythm.  Pulmonary:     Effort: Pulmonary effort is normal.     Breath sounds: Normal breath sounds.  Abdominal:     Palpations: Abdomen is soft.  Musculoskeletal:     Cervical back: Normal range of motion and neck supple.     Right hip: Tenderness and bony tenderness present. Decreased range of motion. Decreased strength.  Neurological:     Mental Status: She is alert and oriented to person, place, and time.  Psychiatric:        Behavior: Behavior normal.     Vital signs in last 24 hours:    Labs:   Estimated body mass index is 35.58 kg/m as calculated from the following:   Height as of 09/30/22: '5\' 1"'$  (1.549 m).    Weight as of 09/30/22: 85.4 kg.   Imaging Review Plain radiographs demonstrate severe degenerative joint disease of the right hip(s). The bone quality appears to be excellent for age and reported activity level.      Assessment/Plan:  End stage arthritis, right hip(s)  The patient history, physical examination, clinical judgement of the provider and imaging studies are consistent with end stage degenerative joint disease of the right hip(s) and total hip arthroplasty is deemed medically necessary. The treatment options including medical management, injection therapy, arthroscopy and arthroplasty were discussed at length. The risks and benefits of total hip arthroplasty were presented and reviewed. The risks due to aseptic loosening, infection, stiffness, dislocation/subluxation,  thromboembolic complications and other imponderables  were discussed.  The patient acknowledged the explanation, agreed to proceed with the plan and consent was signed. Patient is being admitted for inpatient treatment for surgery, pain control, PT, OT, prophylactic antibiotics, VTE prophylaxis, progressive ambulation and ADL's and discharge planning.The patient is planning to be discharged home with home health services

## 2022-10-03 NOTE — Progress Notes (Signed)
Anesthesia Chart Review:   Case: [id number] Date/Time: 10/04/22 1200   Procedure: RIGHT TOTAL HIP ARTHROPLASTY ANTERIOR APPROACH (Right: Hip)   Anesthesia type: Spinal   Pre-op diagnosis: OSTEOARTHRITIS / DEGENERATIVE JOINT DISEASE RIGHT HIP   Location: [facility] OR ROOM 07 / [facility]   Surgeons: Mcarthur Rossetti, MD       DISCUSSION: Patient is a 57 year old female scheduled for the above procedure.  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 Reported prior history of waking up panicked/fighting after anesthesia.  Anesthesia team to evaluate on the day of surgery.   VS: BP 115/64   Pulse 68   Temp (!) 36.4 C (Oral)   Resp 18   Ht '5\' 1"'$  (1.549 m)   Wt 85.4 kg   SpO2 98%   BMI 35.58 kg/m    PROVIDERS: Emelda Fear, DO is PCP. Last office note and EKG requested, but is still pending.   LABS: Labs reviewed: Acceptable for surgery. (all labs ordered are listed, but only abnormal results are displayed)  Labs Reviewed  SURGICAL PCR SCREEN - Abnormal; Notable for the following components:      Result Value   MRSA, PCR POSITIVE (*)    Staphylococcus aureus POSITIVE (*)    All other components within normal limits  CBC - Abnormal; Notable for the following components:   RDW 21.8 (*)    All other components within normal limits  TYPE AND SCREEN    IMAGES: CXR 09/14/21: FINDINGS:  The heart size and mediastinal contours are within normal limits.  Both lungs are clear. No pleural effusion. The visualized skeletal  structures are unremarkable.  IMPRESSION:  No acute process in the chest.    EKG: 09/14/21 [facility]): Per Result Narrative in Care Everywhere: Normal sinus rhythm with sinus arrhythmia  Normal ECG  No previous ECGs available  Confirmed by Theda Sers ([id number]) on 01/06/2022 4:28:14 AM    CV: She  reported a prior normal stress test ~ 2003.   Past Medical History:  Diagnosis Date   Anemia    Anxiety    Arthritis    Complication of anesthesia    "woke up panicking/fighting"   Depression    Osteoporosis    PONV (postoperative nausea and vomiting)    Sacroiliac joint disease    Unilateral primary osteoarthritis, left hip 10/30/2018    Past Surgical History:  Procedure Laterality Date   CHOLECYSTECTOMY  2004   GASTRIC BYPASS     HAND SURGERY Right    ring finger reconstruction   HERNIA REPAIR     KNEE ARTHROSCOPY W/ ACL RECONSTRUCTION Right 1997   TOTAL HIP ARTHROPLASTY Left 10/30/2018   Procedure: LEFT TOTAL HIP ARTHROPLASTY ANTERIOR APPROACH;  Surgeon: Mcarthur Rossetti, MD;  Location: [facility];  Service: Orthopedics;  Laterality: Left;   TOTAL KNEE ARTHROPLASTY Right 02/08/2022   Procedure: RIGHT TOTAL KNEE ARTHROPLASTY;  Surgeon: Mcarthur Rossetti, MD;  Location: [facility];  Service: Orthopedics;  Laterality: Right;   UMBILICAL HERNIA REPAIR  2004    MEDICATIONS:  ASPIRIN LOW DOSE 81 MG chewable tablet   Aspirin-Acetaminophen-Caffeine (GOODY HEADACHE PO)   cyanocobalamin (VITAMIN B12) 1000 MCG/ML injection   diclofenac Sodium (VOLTAREN) 1 % GEL   DULoxetine (CYMBALTA) 60 MG capsule   hydrOXYzine (ATARAX/VISTARIL) 25 MG tablet   linaclotide (LINZESS) 290 MCG CAPS capsule   meloxicam (MOBIC) 15 MG tablet  Menthol, Topical Analgesic, (ICY HOT MEDICATED SPRAY EX)   Needles & Syringes MISC   ondansetron (ZOFRAN-ODT) 4 MG disintegrating tablet   rOPINIRole (REQUIP) 1 MG tablet   tiZANidine (ZANAFLEX) 4 MG tablet   traMADol (ULTRAM) 50 MG tablet   No current facility-administered medications for this encounter.  Patient not currently taking ASA or Mobic.   Myra Gianotti, PA-C Surgical Short Stay/Anesthesiology [facility] Phone [phone] [facility] Phone [phone] 10/03/2022 9:41 AM

## 2022-10-03 NOTE — Care Plan (Signed)
[facility] RNCM call to patient to discuss her upcoming Right total hip arthroplasty with Dr. Ninfa Linden on 10/04/22. She is an Ortho bundle patient through [facility] and is agreeable to case management. She lives alone, but does have some help with family. She has a RW already. Anticipate HHPT will be needed after short hospital stay. Referral made to [facility] after choice provided. Reviewed post op instructions. Will continue to follow for needs.

## 2022-10-03 NOTE — Telephone Encounter (Signed)
Ortho bundle pre-op call completed. 

## 2022-10-03 NOTE — Anesthesia Preprocedure Evaluation (Addendum)
Anesthesia Evaluation  Patient identified by MRN, date of birth, ID band Patient awake    Reviewed: Allergy & Precautions, NPO status , Patient's Chart, lab work & pertinent test results  History of Anesthesia Complications (+) PONV, Emergence Delirium and history of anesthetic complications  Airway Mallampati: II  TM Distance: >3 FB Neck ROM: Full    Dental no notable dental hx. (+) Teeth Intact, Dental Advisory Given, Caps   Pulmonary neg pulmonary ROS   Pulmonary exam normal breath sounds clear to auscultation       Cardiovascular negative cardio ROS Normal cardiovascular exam Rhythm:Regular Rate:Normal     Neuro/Psych  PSYCHIATRIC DISORDERS Anxiety Depression    negative neurological ROS     GI/Hepatic negative GI ROS, Neg liver ROS,,,  Endo/Other  Obesity  Renal/GU negative Renal ROS  negative genitourinary   Musculoskeletal  (+) Arthritis , Osteoarthritis,  DJD right hip Osteoporosis   Abdominal  (+) + obese  Peds  Hematology  (+) Blood dyscrasia, anemia   Anesthesia Other Findings   Reproductive/Obstetrics                             Anesthesia Physical Anesthesia Plan  ASA: 2  Anesthesia Plan: Spinal   Post-op Pain Management: Dilaudid IV, Tylenol PO (pre-op)*, Precedex and Ketamine IV*   Induction: Intravenous  PONV Risk Score and Plan: 4 or greater and Treatment may vary due to age or medical condition, Midazolam, Dexamethasone, Ondansetron and Propofol infusion  Airway Management Planned: Natural Airway and Simple Face Mask  Additional Equipment: None  Intra-op Plan:   Post-operative Plan:   Informed Consent: I have reviewed the patients History and Physical, chart, labs and discussed the procedure including the risks, benefits and alternatives for the proposed anesthesia with the patient or authorized representative who has indicated his/her understanding and  acceptance.     Dental advisory given  Plan Discussed with: CRNA and Anesthesiologist  Anesthesia Plan Comments: (PAT note written 10/03/2022 by Myra Gianotti, PA-C.  )       Anesthesia Quick Evaluation

## 2022-10-04 ENCOUNTER — Ambulatory Visit (HOSPITAL_COMMUNITY): Payer: Medicare Other

## 2022-10-04 ENCOUNTER — Ambulatory Visit (HOSPITAL_BASED_OUTPATIENT_CLINIC_OR_DEPARTMENT_OTHER): Payer: Medicare Other | Admitting: Anesthesiology

## 2022-10-04 ENCOUNTER — Encounter (HOSPITAL_COMMUNITY)
Admission: RE | Disposition: A | Discharge status: Home / Self Care | Payer: Self-pay | Source: Home / Self Care | Attending: Orthopaedic Surgery

## 2022-10-04 ENCOUNTER — Ambulatory Visit (HOSPITAL_COMMUNITY): Payer: Medicare Other | Admitting: Physician Assistant

## 2022-10-04 ENCOUNTER — Other Ambulatory Visit: Payer: Self-pay

## 2022-10-04 ENCOUNTER — Encounter (HOSPITAL_COMMUNITY): Payer: Self-pay | Admitting: Orthopaedic Surgery

## 2022-10-04 ENCOUNTER — Observation Stay (HOSPITAL_COMMUNITY): Payer: Medicare Other

## 2022-10-04 ENCOUNTER — Inpatient Hospital Stay (HOSPITAL_COMMUNITY)
Admission: RE | Admit: 2022-10-04 | Discharge: 2022-10-08 | DRG: 470 | Disposition: A | Discharge status: Home / Self Care | Payer: Medicare Other | Attending: Orthopaedic Surgery | Admitting: Orthopaedic Surgery

## 2022-10-04 DIAGNOSIS — Z83438 Family history of other disorder of lipoprotein metabolism and other lipidemia: Secondary | ICD-10-CM

## 2022-10-04 DIAGNOSIS — Z9049 Acquired absence of other specified parts of digestive tract: Secondary | ICD-10-CM

## 2022-10-04 DIAGNOSIS — M810 Age-related osteoporosis without current pathological fracture: Secondary | ICD-10-CM | POA: Diagnosis present

## 2022-10-04 DIAGNOSIS — Z79899 Other long term (current) drug therapy: Secondary | ICD-10-CM

## 2022-10-04 DIAGNOSIS — Z96641 Presence of right artificial hip joint: Secondary | ICD-10-CM

## 2022-10-04 DIAGNOSIS — Z96642 Presence of left artificial hip joint: Secondary | ICD-10-CM | POA: Diagnosis present

## 2022-10-04 DIAGNOSIS — Z9884 Bariatric surgery status: Secondary | ICD-10-CM

## 2022-10-04 DIAGNOSIS — M1611 Unilateral primary osteoarthritis, right hip: Principal | ICD-10-CM | POA: Diagnosis present

## 2022-10-04 DIAGNOSIS — Z471 Aftercare following joint replacement surgery: Secondary | ICD-10-CM | POA: Diagnosis not present

## 2022-10-04 DIAGNOSIS — Z6835 Body mass index (BMI) 35.0-35.9, adult: Secondary | ICD-10-CM

## 2022-10-04 DIAGNOSIS — Z8249 Family history of ischemic heart disease and other diseases of the circulatory system: Secondary | ICD-10-CM

## 2022-10-04 DIAGNOSIS — F32A Depression, unspecified: Secondary | ICD-10-CM | POA: Diagnosis present

## 2022-10-04 DIAGNOSIS — E669 Obesity, unspecified: Secondary | ICD-10-CM | POA: Diagnosis present

## 2022-10-04 DIAGNOSIS — Z96651 Presence of right artificial knee joint: Secondary | ICD-10-CM | POA: Diagnosis present

## 2022-10-04 HISTORY — PX: TOTAL HIP ARTHROPLASTY: SHX124

## 2022-10-04 SURGERY — ARTHROPLASTY, HIP, TOTAL, ANTERIOR APPROACH
Anesthesia: Spinal | Site: Hip | Laterality: Right

## 2022-10-04 MED ORDER — DULOXETINE HCL 60 MG PO CPEP
120.0000 mg | ORAL_CAPSULE | Freq: Every day | ORAL | Status: DC
  Administered 2022-10-05 – 2022-10-08 (×4): 120 mg via ORAL
  Filled 2022-10-04 (×4): qty 2

## 2022-10-04 MED ORDER — HYDROMORPHONE HCL 2 MG PO TABS
2.0000 mg | ORAL_TABLET | ORAL | Status: DC | PRN
  Filled 2022-10-04: qty 1

## 2022-10-04 MED ORDER — DIPHENHYDRAMINE HCL 12.5 MG/5ML PO ELIX: 12.5000 mg | ORAL_SOLUTION | ORAL | Status: DC | PRN

## 2022-10-04 MED ORDER — HYDROXYZINE HCL 25 MG PO TABS
25.0000 mg | ORAL_TABLET | Freq: Two times a day (BID) | ORAL | Status: DC
  Administered 2022-10-04 – 2022-10-08 (×8): 25 mg via ORAL
  Filled 2022-10-04 (×8): qty 1

## 2022-10-04 MED ORDER — POLYETHYLENE GLYCOL 3350 17 G PO PACK: 17.0000 g | PACK | Freq: Every day | ORAL | Status: DC | PRN

## 2022-10-04 MED ORDER — POVIDONE-IODINE 10 % EX SWAB
2.0000 | Freq: Once | CUTANEOUS | Status: AC
  Administered 2022-10-04: 2 via TOPICAL

## 2022-10-04 MED ORDER — 0.9 % SODIUM CHLORIDE (POUR BTL) OPTIME
TOPICAL | Status: DC | PRN
  Administered 2022-10-04: 1000 mL

## 2022-10-04 MED ORDER — ORAL CARE MOUTH RINSE: 15.0000 mL | Freq: Once | OROMUCOSAL | Status: AC

## 2022-10-04 MED ORDER — MIDAZOLAM HCL 5 MG/5ML IJ SOLN
INTRAMUSCULAR | Status: DC | PRN
  Administered 2022-10-04: 2 mg via INTRAVENOUS

## 2022-10-04 MED ORDER — OXYCODONE HCL 5 MG PO TABS
5.0000 mg | ORAL_TABLET | ORAL | Status: DC | PRN
  Administered 2022-10-04 – 2022-10-07 (×15): 10 mg via ORAL
  Administered 2022-10-08: 5 mg via ORAL
  Administered 2022-10-08 (×2): 10 mg via ORAL
  Filled 2022-10-04 (×19): qty 2

## 2022-10-04 MED ORDER — TRANEXAMIC ACID-NACL 1000-0.7 MG/100ML-% IV SOLN
INTRAVENOUS | Status: DC | PRN
  Administered 2022-10-04: 1000 mg via INTRAVENOUS

## 2022-10-04 MED ORDER — FENTANYL CITRATE (PF) 250 MCG/5ML IJ SOLN
INTRAMUSCULAR | Status: AC
  Filled 2022-10-04: qty 5

## 2022-10-04 MED ORDER — CHLORHEXIDINE GLUCONATE 0.12 % MT SOLN
15.0000 mL | Freq: Once | OROMUCOSAL | Status: AC
  Administered 2022-10-04: 15 mL via OROMUCOSAL
  Filled 2022-10-04: qty 15

## 2022-10-04 MED ORDER — ROPINIROLE HCL 0.5 MG PO TABS
1.0000 mg | ORAL_TABLET | Freq: Every evening | ORAL | Status: DC | PRN
  Administered 2022-10-06: 1 mg via ORAL
  Administered 2022-10-07 (×2): 2 mg via ORAL
  Filled 2022-10-04 (×2): qty 4
  Filled 2022-10-04: qty 2

## 2022-10-04 MED ORDER — CYANOCOBALAMIN 1000 MCG/ML IJ SOLN
1000.0000 ug | INTRAMUSCULAR | Status: DC
  Administered 2022-10-04: 1000 ug via INTRAMUSCULAR
  Filled 2022-10-04: qty 1

## 2022-10-04 MED ORDER — GLYCOPYRROLATE PF 0.2 MG/ML IJ SOSY
PREFILLED_SYRINGE | INTRAMUSCULAR | Status: DC | PRN
  Administered 2022-10-04: .2 mg via INTRAVENOUS

## 2022-10-04 MED ORDER — HYDROMORPHONE HCL 1 MG/ML IJ SOLN: 0.2500 mg | INTRAMUSCULAR | Status: DC | PRN

## 2022-10-04 MED ORDER — VANCOMYCIN HCL IN DEXTROSE 1-5 GM/200ML-% IV SOLN
1000.0000 mg | Freq: Two times a day (BID) | INTRAVENOUS | Status: AC
  Administered 2022-10-04: 1000 mg via INTRAVENOUS
  Filled 2022-10-04: qty 200

## 2022-10-04 MED ORDER — ONDANSETRON HCL 4 MG PO TABS
4.0000 mg | ORAL_TABLET | Freq: Four times a day (QID) | ORAL | Status: DC | PRN
  Administered 2022-10-07: 4 mg via ORAL
  Filled 2022-10-04: qty 1

## 2022-10-04 MED ORDER — DOCUSATE SODIUM 100 MG PO CAPS
100.0000 mg | ORAL_CAPSULE | Freq: Two times a day (BID) | ORAL | Status: DC
  Administered 2022-10-04 – 2022-10-08 (×7): 100 mg via ORAL
  Filled 2022-10-04 (×8): qty 1

## 2022-10-04 MED ORDER — ONDANSETRON HCL 4 MG/2ML IJ SOLN
INTRAMUSCULAR | Status: DC | PRN
  Administered 2022-10-04: 4 mg via INTRAVENOUS

## 2022-10-04 MED ORDER — OXYCODONE HCL 5 MG PO TABS: 5.0000 mg | ORAL_TABLET | Freq: Once | ORAL | Status: DC | PRN

## 2022-10-04 MED ORDER — MIDAZOLAM HCL 2 MG/2ML IJ SOLN
INTRAMUSCULAR | Status: AC
  Filled 2022-10-04: qty 2

## 2022-10-04 MED ORDER — GABAPENTIN 100 MG PO CAPS
100.0000 mg | ORAL_CAPSULE | Freq: Three times a day (TID) | ORAL | Status: DC
  Administered 2022-10-04 – 2022-10-08 (×12): 100 mg via ORAL
  Filled 2022-10-04 (×12): qty 1

## 2022-10-04 MED ORDER — PANTOPRAZOLE SODIUM 40 MG PO TBEC
40.0000 mg | DELAYED_RELEASE_TABLET | Freq: Every day | ORAL | Status: DC
  Administered 2022-10-04 – 2022-10-08 (×5): 40 mg via ORAL
  Filled 2022-10-04 (×5): qty 1

## 2022-10-04 MED ORDER — MIDAZOLAM HCL 2 MG/2ML IJ SOLN
INTRAMUSCULAR | Status: AC
  Administered 2022-10-04: 1 mg via INTRAVENOUS
  Filled 2022-10-04: qty 2

## 2022-10-04 MED ORDER — FENTANYL CITRATE (PF) 100 MCG/2ML IJ SOLN
INTRAMUSCULAR | Status: DC | PRN
  Administered 2022-10-04: 50 ug via INTRAVENOUS

## 2022-10-04 MED ORDER — SODIUM CHLORIDE 0.9 % IV SOLN: INTRAVENOUS | Status: DC

## 2022-10-04 MED ORDER — ONDANSETRON HCL 4 MG/2ML IJ SOLN: 4.0000 mg | Freq: Four times a day (QID) | INTRAMUSCULAR | Status: DC | PRN

## 2022-10-04 MED ORDER — ASPIRIN 81 MG PO CHEW
81.0000 mg | CHEWABLE_TABLET | Freq: Two times a day (BID) | ORAL | Status: DC
  Administered 2022-10-04 – 2022-10-08 (×8): 81 mg via ORAL
  Filled 2022-10-04 (×8): qty 1

## 2022-10-04 MED ORDER — DEXAMETHASONE SODIUM PHOSPHATE 10 MG/ML IJ SOLN
INTRAMUSCULAR | Status: DC | PRN
  Administered 2022-10-04: 10 mg via INTRAVENOUS

## 2022-10-04 MED ORDER — HYDROMORPHONE HCL 1 MG/ML IJ SOLN
0.5000 mg | INTRAMUSCULAR | Status: DC | PRN
  Administered 2022-10-04 – 2022-10-07 (×7): 1 mg via INTRAVENOUS
  Filled 2022-10-04 (×8): qty 1

## 2022-10-04 MED ORDER — CEFAZOLIN SODIUM-DEXTROSE 2-4 GM/100ML-% IV SOLN
2.0000 g | INTRAVENOUS | Status: AC
  Administered 2022-10-04: 2 g via INTRAVENOUS
  Filled 2022-10-04: qty 100

## 2022-10-04 MED ORDER — LINACLOTIDE 145 MCG PO CAPS
290.0000 ug | ORAL_CAPSULE | Freq: Every day | ORAL | Status: DC
  Administered 2022-10-05: 290 ug via ORAL
  Filled 2022-10-04 (×4): qty 2

## 2022-10-04 MED ORDER — VANCOMYCIN HCL IN DEXTROSE 1-5 GM/200ML-% IV SOLN
1000.0000 mg | Freq: Once | INTRAVENOUS | Status: AC
  Administered 2022-10-04: 1000 mg via INTRAVENOUS
  Filled 2022-10-04: qty 200

## 2022-10-04 MED ORDER — METHOCARBAMOL 500 MG PO TABS
500.0000 mg | ORAL_TABLET | Freq: Four times a day (QID) | ORAL | Status: DC | PRN
  Administered 2022-10-04 (×2): 500 mg via ORAL
  Filled 2022-10-04 (×2): qty 1

## 2022-10-04 MED ORDER — ONDANSETRON HCL 4 MG/2ML IJ SOLN: 4.0000 mg | Freq: Once | INTRAMUSCULAR | Status: DC | PRN

## 2022-10-04 MED ORDER — PHENYLEPHRINE 80 MCG/ML (10ML) SYRINGE FOR IV PUSH (FOR BLOOD PRESSURE SUPPORT)
PREFILLED_SYRINGE | INTRAVENOUS | Status: AC
  Filled 2022-10-04: qty 10

## 2022-10-04 MED ORDER — PROPOFOL 1000 MG/100ML IV EMUL
INTRAVENOUS | Status: AC
  Filled 2022-10-04: qty 100

## 2022-10-04 MED ORDER — MIDAZOLAM HCL 2 MG/2ML IJ SOLN
1.0000 mg | Freq: Once | INTRAMUSCULAR | Status: AC
  Administered 2022-10-04: 1 mg via INTRAVENOUS

## 2022-10-04 MED ORDER — PHENOL 1.4 % MT LIQD: 1.0000 | OROMUCOSAL | Status: DC | PRN

## 2022-10-04 MED ORDER — LACTATED RINGERS IV SOLN: INTRAVENOUS | Status: DC

## 2022-10-04 MED ORDER — PHENYLEPHRINE HCL-NACL 20-0.9 MG/250ML-% IV SOLN
INTRAVENOUS | Status: DC | PRN
  Administered 2022-10-04: 25 ug/min via INTRAVENOUS

## 2022-10-04 MED ORDER — PROPOFOL 10 MG/ML IV BOLUS
INTRAVENOUS | Status: DC | PRN
  Administered 2022-10-04: 100 ug/kg/min via INTRAVENOUS

## 2022-10-04 MED ORDER — ALUM & MAG HYDROXIDE-SIMETH 200-200-20 MG/5ML PO SUSP: 30.0000 mL | ORAL | Status: DC | PRN

## 2022-10-04 MED ORDER — MENTHOL 3 MG MT LOZG: 1.0000 | LOZENGE | OROMUCOSAL | Status: DC | PRN

## 2022-10-04 MED ORDER — DEXMEDETOMIDINE HCL IN NACL 80 MCG/20ML IV SOLN
INTRAVENOUS | Status: DC | PRN
  Administered 2022-10-04: 8 ug via BUCCAL

## 2022-10-04 MED ORDER — METOCLOPRAMIDE HCL 5 MG PO TABS: 5.0000 mg | ORAL_TABLET | Freq: Three times a day (TID) | ORAL | Status: DC | PRN

## 2022-10-04 MED ORDER — OXYCODONE HCL 5 MG/5ML PO SOLN: 5.0000 mg | Freq: Once | ORAL | Status: DC | PRN

## 2022-10-04 MED ORDER — BUPIVACAINE IN DEXTROSE 0.75-8.25 % IT SOLN
INTRATHECAL | Status: DC | PRN
  Administered 2022-10-04: 1.6 mL via INTRATHECAL

## 2022-10-04 MED ORDER — AMISULPRIDE (ANTIEMETIC) 5 MG/2ML IV SOLN: 10.0000 mg | Freq: Once | INTRAVENOUS | Status: DC | PRN

## 2022-10-04 MED ORDER — METHOCARBAMOL 1000 MG/10ML IJ SOLN
500.0000 mg | Freq: Four times a day (QID) | INTRAVENOUS | Status: DC | PRN
  Administered 2022-10-05: 500 mg via INTRAVENOUS
  Filled 2022-10-04: qty 500
  Filled 2022-10-04: qty 5

## 2022-10-04 MED ORDER — SODIUM CHLORIDE 0.9 % IR SOLN
Status: DC | PRN
  Administered 2022-10-04: 1000 mL

## 2022-10-04 MED ORDER — MIDAZOLAM HCL 2 MG/2ML IJ SOLN: 1.0000 mg | Freq: Once | INTRAMUSCULAR | Status: AC

## 2022-10-04 MED ORDER — METOCLOPRAMIDE HCL 5 MG/ML IJ SOLN: 5.0000 mg | Freq: Three times a day (TID) | INTRAMUSCULAR | Status: DC | PRN

## 2022-10-04 MED ORDER — ACETAMINOPHEN 325 MG PO TABS
325.0000 mg | ORAL_TABLET | Freq: Four times a day (QID) | ORAL | Status: DC | PRN
  Administered 2022-10-05 – 2022-10-06 (×3): 650 mg via ORAL
  Administered 2022-10-06: 325 mg via ORAL
  Administered 2022-10-07 – 2022-10-08 (×4): 650 mg via ORAL
  Filled 2022-10-04: qty 2
  Filled 2022-10-04: qty 1
  Filled 2022-10-04 (×6): qty 2

## 2022-10-04 SURGICAL SUPPLY — 54 items
BAG COUNTER SPONGE SURGICOUNT (BAG) ×1 IMPLANT
BENZOIN TINCTURE PRP APPL 2/3 (GAUZE/BANDAGES/DRESSINGS) ×1 IMPLANT
BLADE CLIPPER SURG (BLADE) IMPLANT
BLADE SAW SGTL 18X1.27X75 (BLADE) ×1 IMPLANT
COVER SURGICAL LIGHT HANDLE (MISCELLANEOUS) ×1 IMPLANT
DRAPE C-ARM 42X72 X-RAY (DRAPES) ×1 IMPLANT
DRAPE STERI IOBAN 125X83 (DRAPES) ×1 IMPLANT
DRAPE U-SHAPE 47X51 STRL (DRAPES) ×3 IMPLANT
DRSG AQUACEL AG ADV 3.5X10 (GAUZE/BANDAGES/DRESSINGS) ×1 IMPLANT
DURAPREP 26ML APPLICATOR (WOUND CARE) ×1 IMPLANT
ELECT BLADE 4.0 EZ CLEAN MEGAD (MISCELLANEOUS) ×1
ELECT BLADE 6.5 EXT (BLADE) IMPLANT
ELECT REM PT RETURN 9FT ADLT (ELECTROSURGICAL) ×1
ELECTRODE BLDE 4.0 EZ CLN MEGD (MISCELLANEOUS) ×1 IMPLANT
ELECTRODE REM PT RTRN 9FT ADLT (ELECTROSURGICAL) ×1 IMPLANT
FACESHIELD WRAPAROUND (MASK) ×2 IMPLANT
FACESHIELD WRAPAROUND OR TEAM (MASK) ×2 IMPLANT
FEM STEM 12/14 TAPER SZ 4 HIP (Orthopedic Implant) ×1 IMPLANT
FEMORAL STEM 12/14 TPR SZ4 HIP (Orthopedic Implant) IMPLANT
GLOVE BIOGEL PI IND STRL 8 (GLOVE) ×2 IMPLANT
GLOVE ECLIPSE 8.0 STRL XLNG CF (GLOVE) ×1 IMPLANT
GLOVE ORTHO TXT STRL SZ7.5 (GLOVE) ×2 IMPLANT
GOWN STRL REUS W/ TWL LRG LVL3 (GOWN DISPOSABLE) ×2 IMPLANT
GOWN STRL REUS W/ TWL XL LVL3 (GOWN DISPOSABLE) ×2 IMPLANT
GOWN STRL REUS W/TWL LRG LVL3 (GOWN DISPOSABLE) ×2
GOWN STRL REUS W/TWL XL LVL3 (GOWN DISPOSABLE) ×2
HANDPIECE INTERPULSE COAX TIP (DISPOSABLE) ×1
HEAD CERAMIC DELTA 36 PLUS 1.5 (Hips) IMPLANT
KIT BASIN OR (CUSTOM PROCEDURE TRAY) ×1 IMPLANT
KIT TURNOVER KIT B (KITS) ×1 IMPLANT
LINER ACETAB NEUTRAL 36ID 520D (Liner) IMPLANT
MANIFOLD NEPTUNE II (INSTRUMENTS) ×1 IMPLANT
NS IRRIG 1000ML POUR BTL (IV SOLUTION) ×1 IMPLANT
PACK TOTAL JOINT (CUSTOM PROCEDURE TRAY) ×1 IMPLANT
PAD ARMBOARD 7.5X6 YLW CONV (MISCELLANEOUS) ×1 IMPLANT
PIN SECTOR W/GRIP ACE CUP 52MM (Hips) IMPLANT
SET HNDPC FAN SPRY TIP SCT (DISPOSABLE) ×1 IMPLANT
STAPLER VISISTAT 35W (STAPLE) IMPLANT
STRIP CLOSURE SKIN 1/2X4 (GAUZE/BANDAGES/DRESSINGS) ×2 IMPLANT
SUT ETHIBOND NAB CT1 #1 30IN (SUTURE) ×1 IMPLANT
SUT ETHILON 2 0 FS 18 (SUTURE) IMPLANT
SUT MNCRL AB 4-0 PS2 18 (SUTURE) IMPLANT
SUT VIC AB 0 CT1 27 (SUTURE) ×1
SUT VIC AB 0 CT1 27XBRD ANBCTR (SUTURE) ×1 IMPLANT
SUT VIC AB 1 CT1 27 (SUTURE) ×1
SUT VIC AB 1 CT1 27XBRD ANBCTR (SUTURE) ×1 IMPLANT
SUT VIC AB 2-0 CT1 27 (SUTURE) ×1
SUT VIC AB 2-0 CT1 TAPERPNT 27 (SUTURE) ×1 IMPLANT
TOWEL GREEN STERILE (TOWEL DISPOSABLE) ×1 IMPLANT
TOWEL GREEN STERILE FF (TOWEL DISPOSABLE) ×1 IMPLANT
TRAY CATH 16FR W/PLASTIC CATH (SET/KITS/TRAYS/PACK) IMPLANT
TRAY FOLEY W/BAG SLVR 16FR (SET/KITS/TRAYS/PACK)
TRAY FOLEY W/BAG SLVR 16FR ST (SET/KITS/TRAYS/PACK) IMPLANT
WATER STERILE IRR 1000ML POUR (IV SOLUTION) ×2 IMPLANT

## 2022-10-04 NOTE — Transfer of Care (Signed)
Immediate Anesthesia Transfer of Care Note  Patient: SHAWNTIA MANGAL  Procedure(s) Performed: RIGHT TOTAL HIP ARTHROPLASTY ANTERIOR APPROACH (Right: Hip)  Patient Location: PACU  Anesthesia Type:General  Level of Consciousness: awake and alert   Airway & Oxygen Therapy: Patient Spontanous Breathing and Patient connected to nasal cannula oxygen  Post-op Assessment: Report given to RN  Post vital signs: Reviewed and stable  Last Vitals:  Vitals Value Taken Time  BP    Temp    Pulse 94 10/04/22 1451  Resp 19 10/04/22 1451  SpO2 97 % 10/04/22 1451  Vitals shown include unvalidated device data.  Last Pain: There were no vitals filed for this visit.    Patients Stated Pain Goal: 0 (46/27/03 5009)  Complications: No notable events documented.

## 2022-10-04 NOTE — Anesthesia Postprocedure Evaluation (Signed)
Anesthesia Post Note  Patient: Kaylee Salas  Procedure(s) Performed: RIGHT TOTAL HIP ARTHROPLASTY ANTERIOR APPROACH (Right: Hip)     Patient location during evaluation: PACU Anesthesia Type: Spinal Level of consciousness: oriented and awake and alert Pain management: pain level controlled Vital Signs Assessment: post-procedure vital signs reviewed and stable Respiratory status: spontaneous breathing, respiratory function stable and nonlabored ventilation Cardiovascular status: blood pressure returned to baseline and stable Postop Assessment: no headache, no backache, no apparent nausea or vomiting and spinal receding Anesthetic complications: no   No notable events documented.  Last Vitals:  Vitals:   10/04/22 1515 10/04/22 1530  BP: (!) 91/49 119/73  Pulse: 79 64  Resp: 15 16  Temp:    SpO2: 99% 99%    Last Pain:  Vitals:   10/04/22 1530  PainSc: 0-No pain                 Ridley Schewe A.

## 2022-10-04 NOTE — Plan of Care (Signed)
  Problem: Education: Goal: Knowledge of General Education information will improve Description: Including pain rating scale, medication(s)/side effects and non-pharmacologic comfort measures Outcome: Progressing   Problem: Health Behavior/Discharge Planning: Goal: Ability to manage health-related needs will improve Outcome: Progressing   Problem: Clinical Measurements: Goal: Will remain free from infection Outcome: Progressing   Problem: Activity: Goal: Risk for activity intolerance will decrease Outcome: Progressing   Problem: Nutrition: Goal: Adequate nutrition will be maintained Outcome: Progressing   Problem: Coping: Goal: Level of anxiety will decrease Outcome: Progressing   Problem: Elimination: Goal: Will not experience complications related to bowel motility Outcome: Progressing   Problem: Pain Managment: Goal: General experience of comfort will improve Outcome: Progressing

## 2022-10-04 NOTE — Op Note (Signed)
Operative Note  Date of operation: 10/04/2022 Preoperative diagnosis: Right hip primary osteoarthritis Postoperative diagnosis: Same  Procedure: Right direct anterior total hip arthroplasty  Implants: DePuy sector GRIPTION acetabular component size 52, 36+0 neutral polythene liner, size 4 Actis femoral component with high offset, 36+1.5 ceramic head ball  Surgeon: Lind Guest. Ninfa Linden, MD Assistant: Benita Stabile, PA-C  Anesthesia: Spinal EBL: 250 cc Antibiotics: IV vancomycin Complications: None  Indications: The patient is a 57 year old with debilitating arthritis involving her right hip this is been well-documented.  I have actually replaced her left hip and her right knee in the past.  At this point her right hip pain is daily and it is detrimentally affecting her mobility, her quality of life and her actives daily living.  Her x-rays show significant arthritis of the right hip and she has failed conservative treatment for over a year.  She wished to proceed with a hip replacement and having had this before she is fully aware of the risk and benefits of surgery.  Procedure description: After informed consent was obtained and the appropriate right hip was marked, the patient was brought to the operating room and set up on her stretcher where spinal anesthesia was obtained.  She was then laid on a supine position on the stretcher and a Foley catheter was placed.  Traction boots were placed on both her feet and she was placed supine on the Hana fracture table with a perineal post in place in both legs and left skeletal traction.  Of note her leg lengths preoperatively are equal.  Her x-rays do look like that she is longer on this side but we know clinically she is equal.  Intraoperative films confirm that as well.  The right operative hip was then prepped and draped with DuraPrep and sterile drapes.  A timeout was called and she identified as correct patient the correct right hip.  An incision  was then made just inferior and posterior to the ASIS and carried slightly obliquely down the leg.  Dissection was carried down to the tensor fascia lata muscle and the tensor fascia was then divided longitudinally to proceed with a direct anterior approach to the hip.  Circumflex vessels were identified and cauterized.  The hip capsule identified and opened up in L-type format finding a moderate joint effusion and significant osteophytes around the lateral femoral head neck.  Cobra retractors were then placed around the medial lateral femoral neck and a femoral neck cut was made with an oscillating saw just proximal to the lesser trochanter and completed with an osteotome.  A corkscrew guide was placed in the femoral head and the femoral head was removed in its entirety.  There is a wide area devoid of cartilage.  A bent Hohmann was then placed over the medial acetabular rim and remnants of the acetabular labrum and other debris were removed.  We then began reaming under direct visualization from a size 43 reamer and stepwise increments going to a size 51 reamer with all reamers placed under direct visualization and the last reamer placed under direct fluoroscopy in order to obtain the depth of reaming, the inclination and the anteversion.  The real DePuy sector GRIPTION acetabular opponent size 52 was then placed without difficulty and we went with a 36+0 polythene liner for that size acetabular pendant.  Attention was then turned to the femur.  With the right leg externally rotated to 120 degrees, extended and adducted, a Mueller retractor was placed by the medial femoral neck  and calcar and a l bent Hohmann was placed behind the greater trochanter.  The lateral joint capsule was released and a box cutting osteotome was used into the femoral canal.  Broaching was then initiated from a size 0 broach and stepwise increments going to a size 4 broach using the Actis broaching system.  We then trialed a high offset  femoral neck with a 36+1.5 trial hip ball.  This was reduced and acetabulum we were able to assess it radiographically and mechanically and replaced with our assessment.  We then dislocated the hip and remove the trial components.  We placed the real Actis femoral component with high offset size 4 from DePuy and the real 36+1.5 ceramic hip bone again reduced this and acetabular pleased with leg length, offset, range of motion and stability assessed radiographically and mechanically.  We then irrigated the soft tissue normal saline solution.  The joint capsule remnants were closed with interrupted #1 Ethibond suture followed by #1 Vicryl close the tensor fascia.  0 Vicryl was used to close the deep tissue and 2-0 Vicryl was used to close subcutaneous tissue.  The skin was closed with interrupted 2-0 nylon sutures.  Xeroform and Aquacel dressing was applied.  The patient was taken off the Hana table and taken recovery room in stable condition with all final counts being correct and no complications noted.  Benita Stabile, PA-C did assist in entire case from beginning to end and his assistance was medically necessary and crucial for soft tissue management and retraction, helping guide implant placement and a layered closure of the wound.

## 2022-10-04 NOTE — Anesthesia Procedure Notes (Signed)
Spinal  Patient location during procedure: OR Start time: 10/04/2022 1:04 PM End time: 10/04/2022 1:08 PM Reason for block: surgical anesthesia Staffing Performed: anesthesiologist  Anesthesiologist: Josephine Igo, MD Performed by: Josephine Igo, MD Authorized by: Josephine Igo, MD   Preanesthetic Checklist Completed: patient identified, IV checked, site marked, risks and benefits discussed, surgical consent, monitors and equipment checked, pre-op evaluation and timeout performed Spinal Block Patient position: sitting Prep: DuraPrep and site prepped and draped Patient monitoring: heart rate, cardiac monitor, continuous pulse ox and blood pressure Approach: midline Location: L3-4 Injection technique: single-shot Needle Needle type: Pencan  Needle gauge: 24 G Needle length: 9 cm Assessment Sensory level: T6 Events: CSF return Additional Notes Patient tolerated procedure well. Adequate sensory level. \

## 2022-10-04 NOTE — Evaluation (Signed)
Physical Therapy Evaluation Patient Details Name: Kaylee Salas MRN: [id number] DOB: 1966-01-12 Today's Date: 10/04/2022  History of Present Illness  57 y.o. female presents to [facility] hospital on 10/04/2022 for elective R THA. PMH includes anemia, anxiety, depression, OA.  Clinical Impression  Pt presents to PT with deficits in strength, power, gait, functional mobility, balance, endurance. Pt's activity tolerance is limited by R hip pain at this time. Pt is able to transfer and tolerates weightbearing through RLE for a few steps this session. Pt will benefit from aggressive and frequent mobilization in an effort to improve activity tolerance. PT will follow up tomorrow for further gait and stair training.       Recommendations for follow up therapy are one component of a multi-disciplinary discharge planning process, led by the attending physician.  Recommendations may be updated based on patient status, additional functional criteria and insurance authorization.  Follow Up Recommendations Follow physician's recommendations for discharge plan and follow up therapies      Assistance Recommended at Discharge PRN  Patient can return home with the following  Help with stairs or ramp for entrance;Assist for transportation;Assistance with cooking/housework;A little help with bathing/dressing/bathroom    Equipment Recommendations Rolling walker (2 wheels)  Recommendations for Other Services       Functional Status Assessment Patient has had a recent decline in their functional status and demonstrates the ability to make significant improvements in function in a reasonable and predictable amount of time.     Precautions / Restrictions Precautions Precautions: Fall Restrictions Weight Bearing Restrictions: Yes RLE Weight Bearing: Weight bearing as tolerated      Mobility  Bed Mobility Overal bed mobility: Needs Assistance Bed Mobility: Supine to Sit, Sit to Supine     Supine to sit:  Supervision Sit to supine: Supervision   General bed mobility comments: use of LLE to raise RLE back to bed    Transfers Overall transfer level: Needs assistance Equipment used: Rolling walker (2 wheels) Transfers: Sit to/from Stand Sit to Stand: Min guard                Ambulation/Gait Ambulation/Gait assistance: Min guard Gait Distance (Feet): 3 Feet Assistive device: Rolling walker (2 wheels) Gait Pattern/deviations: Step-to pattern Gait velocity: reduced Gait velocity interpretation: <1.31 ft/sec, indicative of household ambulator   General Gait Details: slowed step-to, distance limited due to pain  Stairs            Wheelchair Mobility    Modified Rankin (Stroke Patients Only)       Balance Overall balance assessment: Needs assistance Sitting-balance support: No upper extremity supported, Feet supported Sitting balance-Leahy Scale: Good     Standing balance support: Bilateral upper extremity supported, Reliant on assistive device for balance Standing balance-Leahy Scale: Poor                               Pertinent Vitals/Pain Pain Assessment Pain Assessment: 0-10 Pain Score: 8  Pain Location: R hip Pain Descriptors / Indicators: Aching Pain Intervention(s): Premedicated before session    Home Living Family/patient expects to be discharged to:: Private residence Living Arrangements: Alone Available Help at Discharge: Family;Friend(s);Available PRN/intermittently (family on call per patient) Type of Home: House Home Access: Stairs to enter Entrance Stairs-Rails: None Entrance Stairs-Number of Steps: 6   Home Layout: One level Home Equipment: Cane - single point;Rollator (4 wheels);Crutches;Hand held shower head;Adaptive equipment;Toilet riser      Prior Function  Prior Level of Function : Independent/Modified Independent             Mobility Comments: ambulatory with use of rollator       Hand Dominance   Dominant  Hand: Right    Extremity/Trunk Assessment   Upper Extremity Assessment Upper Extremity Assessment: Overall WFL for tasks assessed    Lower Extremity Assessment Lower Extremity Assessment: RLE deficits/detail RLE Deficits / Details: generalized post-op weakness, able to perform SLR    Cervical / Trunk Assessment Cervical / Trunk Assessment: Normal  Communication   Communication: No difficulties  Cognition Arousal/Alertness: Awake/alert Behavior During Therapy: WFL for tasks assessed/performed Overall Cognitive Status: Within Functional Limits for tasks assessed                                          General Comments General comments (skin integrity, edema, etc.): VSS on RA    Exercises     Assessment/Plan    PT Assessment Patient needs continued PT services  PT Problem List Decreased strength;Decreased activity tolerance;Decreased balance;Decreased mobility;Decreased knowledge of use of DME;Pain       PT Treatment Interventions DME instruction;Gait training;Stair training;Functional mobility training;Therapeutic activities;Therapeutic exercise;Balance training;Neuromuscular re-education;Patient/family education    PT Goals (Current goals can be found in the Care Plan section)  Acute Rehab PT Goals Patient Stated Goal: to reduce pain and return to independence PT Goal Formulation: With patient Time For Goal Achievement: 10/09/22 Potential to Achieve Goals: Good    Frequency 7X/week     Co-evaluation               AM-PAC PT "6 Clicks" Mobility  Outcome Measure Help needed turning from your back to your side while in a flat bed without using bedrails?: A Little Help needed moving from lying on your back to sitting on the side of a flat bed without using bedrails?: A Little Help needed moving to and from a bed to a chair (including a wheelchair)?: A Little Help needed standing up from a chair using your arms (e.g., wheelchair or bedside  chair)?: A Little Help needed to walk in hospital room?: Total Help needed climbing 3-5 steps with a railing? : Total 6 Click Score: 14    End of Session   Activity Tolerance: Patient limited by pain Patient left: in bed;with call bell/phone within reach;with bed alarm set Nurse Communication: Mobility status PT Visit Diagnosis: Other abnormalities of gait and mobility (R26.89);Muscle weakness (generalized) (M62.81);Pain Pain - Right/Left: Right Pain - part of body: Hip    Time: 1725-1750 PT Time Calculation (min) (ACUTE ONLY): 25 min   Charges:   PT Evaluation $PT Eval Low Complexity: 1 Low          Zenaida Niece, PT, DPT Acute Rehabilitation Office [phone]   Zenaida Niece 10/04/2022, 5:57 PM

## 2022-10-04 NOTE — Progress Notes (Signed)
Pt. Arrived to unit alert oriented, no c/o pain family at bedside. Bed in lowest position call bell within reach.

## 2022-10-05 ENCOUNTER — Encounter: Payer: Self-pay | Admitting: Hematology

## 2022-10-05 ENCOUNTER — Telehealth: Payer: Self-pay

## 2022-10-05 DIAGNOSIS — E669 Obesity, unspecified: Secondary | ICD-10-CM | POA: Diagnosis not present

## 2022-10-05 DIAGNOSIS — F32A Depression, unspecified: Secondary | ICD-10-CM | POA: Diagnosis not present

## 2022-10-05 DIAGNOSIS — Z96642 Presence of left artificial hip joint: Secondary | ICD-10-CM | POA: Diagnosis not present

## 2022-10-05 DIAGNOSIS — Z9884 Bariatric surgery status: Secondary | ICD-10-CM | POA: Diagnosis not present

## 2022-10-05 DIAGNOSIS — M1611 Unilateral primary osteoarthritis, right hip: Secondary | ICD-10-CM | POA: Diagnosis not present

## 2022-10-05 DIAGNOSIS — Z8249 Family history of ischemic heart disease and other diseases of the circulatory system: Secondary | ICD-10-CM | POA: Diagnosis not present

## 2022-10-05 DIAGNOSIS — M810 Age-related osteoporosis without current pathological fracture: Secondary | ICD-10-CM | POA: Diagnosis not present

## 2022-10-05 DIAGNOSIS — Z96651 Presence of right artificial knee joint: Secondary | ICD-10-CM | POA: Diagnosis not present

## 2022-10-05 DIAGNOSIS — Z79899 Other long term (current) drug therapy: Secondary | ICD-10-CM | POA: Diagnosis not present

## 2022-10-05 DIAGNOSIS — Z83438 Family history of other disorder of lipoprotein metabolism and other lipidemia: Secondary | ICD-10-CM | POA: Diagnosis not present

## 2022-10-05 DIAGNOSIS — Z9049 Acquired absence of other specified parts of digestive tract: Secondary | ICD-10-CM | POA: Diagnosis not present

## 2022-10-05 DIAGNOSIS — Z6835 Body mass index (BMI) 35.0-35.9, adult: Secondary | ICD-10-CM | POA: Diagnosis not present

## 2022-10-05 LAB — BASIC METABOLIC PANEL
Anion gap: 9 (ref 5–15)
BUN: 10 mg/dL (ref 6–20)
CO2: 26 mmol/L (ref 22–32)
Calcium: 7.9 mg/dL — ABNORMAL LOW (ref 8.9–10.3)
Chloride: 101 mmol/L (ref 98–111)
Creatinine, Ser: 0.78 mg/dL (ref 0.44–1.00)
GFR, Estimated: >60 mL/min (ref >60)
Glucose, Bld: 127 mg/dL — ABNORMAL HIGH (ref 70–99)
Potassium: 3.6 mmol/L (ref 3.5–5.1)
Sodium: 136 mmol/L (ref 135–145)

## 2022-10-05 LAB — CBC
HCT: 29.9 % — ABNORMAL LOW (ref 36.0–46.0)
Hemoglobin: 9.6 g/dL — ABNORMAL LOW (ref 12.0–15.0)
MCH: 28.5 pg (ref 26.0–34.0)
MCHC: 32.1 g/dL (ref 30.0–36.0)
MCV: 88.7 fL (ref 80.0–100.0)
Platelets: 208 10*3/uL (ref 150–400)
RBC: 3.37 MIL/uL — ABNORMAL LOW (ref 3.87–5.11)
RDW: 21.9 % — ABNORMAL HIGH (ref 11.5–15.5)
WBC: 5.8 10*3/uL (ref 4.0–10.5)
nRBC: 0 % (ref 0.0–0.2)

## 2022-10-05 MED ORDER — TIZANIDINE HCL 4 MG PO TABS
4.0000 mg | ORAL_TABLET | Freq: Four times a day (QID) | ORAL | Status: DC | PRN
  Administered 2022-10-05 – 2022-10-08 (×8): 4 mg via ORAL
  Filled 2022-10-05 (×9): qty 1

## 2022-10-05 MED ORDER — KETOROLAC TROMETHAMINE 15 MG/ML IJ SOLN
7.5000 mg | Freq: Four times a day (QID) | INTRAMUSCULAR | Status: AC
  Administered 2022-10-05 (×3): 7.5 mg via INTRAVENOUS
  Filled 2022-10-05 (×3): qty 1

## 2022-10-05 NOTE — Plan of Care (Signed)

## 2022-10-05 NOTE — Progress Notes (Signed)
Called [facility] after hrs services; pt still complaining of pain even with round the clock pain meds & muscle relaxants given; awaiting call back from on call doctor for orders.

## 2022-10-05 NOTE — Telephone Encounter (Signed)
Kaylee Salas with [facility] utilization would like an inpatient order for patient. CB# [phone].  Please advise.

## 2022-10-05 NOTE — Care Plan (Signed)
[facility] Case Management Note  Patient Details  Name: Kaylee Salas MRN: [id number] Date of Birth: 1966-04-28   [facility] RNCM call to patient to discuss her upcoming Right total hip arthroplasty with Dr. Ninfa Linden on 10/04/22. She is an Ortho bundle patient through [facility] and is agreeable to case management. She lives alone, but does have some help with family. She has a RW already. Anticipate HHPT will be needed after short hospital stay. Referral made to [facility] after choice provided. Reviewed post op instructions. Will continue to follow for needs.   DME Arranged:   (Patient reports she has a RW already.) DME Agency:     HH Arranged:  PT [place]:  [place]  Additional Comments: Please contact me with any questions of if this plan should need to change.  Jamse Arn, RN, BSN, [employer]  [phone] 10/05/2022, 8:53 AM

## 2022-10-05 NOTE — Progress Notes (Signed)
[facility] on call doctor called back with orders.

## 2022-10-05 NOTE — Progress Notes (Signed)
Physical Therapy Treatment Patient Details Name: Kaylee Salas MRN: [id number] DOB: 1966-06-06 Today's Date: 10/05/2022   History of Present Illness 57 y.o. female presents to [facility] hospital on 10/04/2022 for elective R THA. PMH includes anemia, anxiety, depression, OA.    PT Comments    Pt was received in supine and agreeable to therapy, however reports not sleeping well last night. Pt was able to demonstrate HEP requiring intermittent assist due to pain. Pt reports feeling stiff and wants to move more. Pt was able to sit EOB, however required significantly increased time and max A to scoot due to pain and weakness. Pt was slow to follow commands and occasionally required cues multiple times to respond. Pt required up to mod A to stand and initially flexed forward leaning forearms on RW needing heavy cues to stand upright. Pt reported feeling dizzy, so she returned to sitting and orthostatic vitals were taken, but were not significant. Due to pt status and limited participation due to lethargy, PTA will attempt second session this afternoon and RN was notified. Pt continues to benefit from PT services to progress toward functional mobility goals.   Orthostatic BPs  Supine 106/55  Sitting 98/62       Recommendations for follow up therapy are one component of a multi-disciplinary discharge planning process, led by the attending physician.  Recommendations may be updated based on patient status, additional functional criteria and insurance authorization.  Follow Up Recommendations  Follow physician's recommendations for discharge plan and follow up therapies     Assistance Recommended at Discharge PRN  Patient can return home with the following Help with stairs or ramp for entrance;Assist for transportation;Assistance with cooking/housework;A little help with bathing/dressing/bathroom   Equipment Recommendations  Rolling walker (2 wheels)    Recommendations for Other Services        Precautions / Restrictions Precautions Precautions: Fall Restrictions Weight Bearing Restrictions: Yes RLE Weight Bearing: Weight bearing as tolerated     Mobility  Bed Mobility Overal bed mobility: Needs Assistance Bed Mobility: Supine to Sit, Sit to Supine, Rolling Rolling: Mod assist   Supine to sit: Max assist, HOB elevated Sit to supine: Max assist, +2 for physical assistance   General bed mobility comments: Use of gait belt to move RLE when moving to sit EOB. Pt required significantly increased time due to lethargy, weakness, and R hip pain. Pt required max A to scoot anteriorly to EOB. Pt required max A +2 from sit to supine due to dizziness, lethargy, and pain. Pt required mod A to roll to adjust bed pad.    Transfers Overall transfer level: Needs assistance Equipment used: Rolling walker (2 wheels) Transfers: Sit to/from Stand Sit to Stand: Mod assist           General transfer comment: Pt required heavy cues for safe hand placement and mod A to power up and upright posture. Pt initially flexed forward leaning B forearms on RW and slowly came to upright posture.           Balance Overall balance assessment: Needs assistance Sitting-balance support: Bilateral upper extremity supported, Feet supported Sitting balance-Leahy Scale: Fair Sitting balance - Comments: sitting EOB   Standing balance support: Bilateral upper extremity supported, Reliant on assistive device for balance Standing balance-Leahy Scale: Poor Standing balance comment: with RW support                            Cognition Arousal/Alertness: Lethargic Behavior  During Therapy: Flat affect Overall Cognitive Status: Within Functional Limits for tasks assessed                                 General Comments: Pt lethargic and slow to follow cues possibly due to pain medication and poor sleep per pt report.        Exercises Total Joint Exercises Ankle  Circles/Pumps: AROM, Both, 10 reps Quad Sets: Both, 5 reps, AROM, Supine Gluteal Sets: Supine Towel Squeeze: Supine Short Arc Quad: AROM, Right, 5 reps, Supine Heel Slides: Supine, Right, 5 reps, AAROM Hip ABduction/ADduction: Supine, Right, 5 reps, AAROM    General Comments General comments (skin integrity, edema, etc.): Pt reported dizziness in standing, so she returned to sitting. BP in sitting: 98/62, HR 114bpm; BP in supine: 106/55, HR 92      Pertinent Vitals/Pain Pain Assessment Pain Assessment: Faces Faces Pain Scale: Hurts whole lot Pain Location: R hip Pain Descriptors / Indicators: Grimacing, Guarding Pain Intervention(s): Limited activity within patient's tolerance, Monitored during session, Ice applied, Repositioned     PT Goals (current goals can now be found in the care plan section) Acute Rehab PT Goals Patient Stated Goal: to reduce pain and return to independence PT Goal Formulation: With patient Time For Goal Achievement: 10/09/22 Potential to Achieve Goals: Good Progress towards PT goals: Progressing toward goals (slowly, limited by pain and lethargy)    Frequency    7X/week      PT Plan Current plan remains appropriate       AM-PAC PT "6 Clicks" Mobility   Outcome Measure  Help needed turning from your back to your side while in a flat bed without using bedrails?: A Lot Help needed moving from lying on your back to sitting on the side of a flat bed without using bedrails?: A Lot Help needed moving to and from a bed to a chair (including a wheelchair)?: A Lot Help needed standing up from a chair using your arms (e.g., wheelchair or bedside chair)?: A Lot Help needed to walk in hospital room?: Total Help needed climbing 3-5 steps with a railing? : Total 6 Click Score: 10    End of Session Equipment Utilized During Treatment: Gait belt Activity Tolerance: Patient limited by pain;Patient limited by lethargy Patient left: in bed;with call  bell/phone within reach;with bed alarm set;with SCD's reapplied Nurse Communication: Mobility status (RN notified of pt lethargy and pain status) PT Visit Diagnosis: Other abnormalities of gait and mobility (R26.89);Muscle weakness (generalized) (M62.81);Pain Pain - Right/Left: Right Pain - part of body: Hip     Time: 6195-0932 PT Time Calculation (min) (ACUTE ONLY): 48 min  Charges:  $Therapeutic Exercise: 8-22 mins $Therapeutic Activity: 23-37 mins                     Michelle Nasuti, PTA Acute Rehabilitation Services Secure Chat Preferred  [phone]    Michelle Nasuti 10/05/2022, 10:48 AM

## 2022-10-05 NOTE — Discharge Instructions (Signed)

## 2022-10-05 NOTE — Progress Notes (Signed)
Subjective: 1 Day Post-Op Procedure(s) (LRB): RIGHT TOTAL HIP ARTHROPLASTY ANTERIOR APPROACH (Right) Patient reports pain as severe.    Objective: Vital signs in last 24 hours: Temp:  [97.6 F (36.4 C)-99.6 F (37.6 C)] 97.6 F (36.4 C) (02/07 0731) Pulse Rate:  [64-102] 102 (02/07 0731) Resp:  [15-20] 18 (02/07 0433) BP: (91-119)/(49-73) 119/58 (02/07 0731) SpO2:  [94 %-100 %] 94 % (02/07 0731) Weight:  [86.2 kg] 86.2 kg (02/06 1018)  Intake/Output from previous day: 02/06 0701 - 02/07 0700 In: 1000 [I.V.:1000] Out: 1875 [Urine:1725; Blood:150] Intake/Output this shift: No intake/output data recorded.  Recent Labs    10/05/22 0415  HGB 9.6*   Recent Labs    10/05/22 0415  WBC 5.8  RBC 3.37*  HCT 29.9*  PLT 208   Recent Labs    10/05/22 0415  NA 136  K 3.6  CL 101  CO2 26  BUN 10  CREATININE 0.78  GLUCOSE 127*  CALCIUM 7.9*   No results for input(s): "LABPT", "INR" in the last 72 hours.  Sensation intact distally Intact pulses distally Dorsiflexion/Plantar flexion intact Incision: scant drainage   Assessment/Plan: 1 Day Post-Op Procedure(s) (LRB): RIGHT TOTAL HIP ARTHROPLASTY ANTERIOR APPROACH (Right) Up with therapy Plan for discharge tomorrow Discharge home with home health      Mcarthur Rossetti 10/05/2022, 7:37 AM

## 2022-10-05 NOTE — Progress Notes (Signed)
Physical Therapy Treatment Patient Details Name: Kaylee Salas MRN: [id number] DOB: 1966/05/14 Today's Date: 10/05/2022   History of Present Illness 57 y.o. female presents to [facility] hospital on 10/04/2022 for elective R THA. PMH includes anemia, anxiety, depression, OA.    PT Comments    Pt was received in supine and agreeable to therapy reporting a desire to be able to move more. Pt continued to require up to mod A for all bed mobility, however was able to use gait belt to lift RLE more this session. Pt required increased time and cues for technique and sequencing to reduce pain. Pt noted that her RLE "feels numb" while sitting EOB, however after seated HEP pt reports numbness improving. Pt performs sit<>stand trials x3, but was not able to march. Pt would benefit from OT consult due to slow progression towards goal and may have difficulty with ADLs due to pain. Will attempt to progress gait as able next session. Pt continues to benefit from PT services to progress toward functional mobility goals.    Recommendations for follow up therapy are one component of a multi-disciplinary discharge planning process, led by the attending physician.  Recommendations may be updated based on patient status, additional functional criteria and insurance authorization.  Follow Up Recommendations  Follow physician's recommendations for discharge plan and follow up therapies     Assistance Recommended at Discharge PRN  Patient can return home with the following Help with stairs or ramp for entrance;Assist for transportation;Assistance with cooking/housework;A little help with bathing/dressing/bathroom   Equipment Recommendations  Rolling walker (2 wheels)    Recommendations for Other Services OT consult (Progressing slowly, would benefit)     Precautions / Restrictions Precautions Precautions: Fall Restrictions Weight Bearing Restrictions: Yes RLE Weight Bearing: Weight bearing as tolerated      Mobility  Bed Mobility Overal bed mobility: Needs Assistance Bed Mobility: Supine to Sit, Sit to Supine Rolling: Mod assist   Supine to sit: Mod assist, HOB elevated Sit to supine: Min assist, HOB elevated   General bed mobility comments: Use of gait belt to move RLE off of/onto bed. Pt required increased time due to pain and heavy verbal cues for sequencing and technique. Pt required less physical assistance this session with use of gait belt, however continued to required min-mod A for trunk and RLE management.    Transfers Overall transfer level: Needs assistance Equipment used: Rolling walker (2 wheels) Transfers: Sit to/from Stand Sit to Stand: Mod assist           General transfer comment: From EOB to RW x 3. Pt required cues for safe hand and RLE placement. Pt required mod A for power up, however pt achieved upright posture quicker on final trial.    Gait Pt worked on weight shifting, RLE heel raises, and RLE toe raises, however was unable to progress to marching due to RLE weakness.       Balance Overall balance assessment: Needs assistance Sitting-balance support: Bilateral upper extremity supported, Feet supported Sitting balance-Leahy Scale: Fair Sitting balance - Comments: sitting EOB   Standing balance support: Bilateral upper extremity supported, Reliant on assistive device for balance Standing balance-Leahy Scale: Poor Standing balance comment: with RW support                            Cognition Arousal/Alertness: Awake/alert Behavior During Therapy: WFL for tasks assessed/performed Overall Cognitive Status: Within Functional Limits for tasks assessed  General Comments: Pt lethargic and slow to follow cues possibly due to pain medication and poor sleep per pt report.        Exercises Total Joint Exercises Ankle Circles/Pumps: AROM, Both, 10 reps Quad Sets: Both, 5 reps, AROM,  Supine Gluteal Sets: Supine Towel Squeeze: Supine Short Arc Quad: AROM, Right, 5 reps, Supine Heel Slides: Supine, Right, 5 reps, AAROM Hip ABduction/ADduction: Supine, Right, 5 reps, AAROM Long Arc Quad: AROM, Right, Seated, 10 reps Knee Flexion: AROM, Seated, Right, 10 reps General Exercises - Lower Extremity Toe Raises: AROM, Standing, Right, 5 reps Heel Raises: AROM, Right, Standing, 5 reps    General Comments General comments (skin integrity, edema, etc.): Pt without adverse s/sx of distress while mobilizing      Pertinent Vitals/Pain Pain Assessment Pain Assessment: 0-10 Pain Score: 7  Faces Pain Scale: Hurts whole lot Pain Location: R hip Pain Descriptors / Indicators: Grimacing, Guarding Pain Intervention(s): Limited activity within patient's tolerance, Monitored during session, Ice applied, RN gave pain meds during session, Repositioned     PT Goals (current goals can now be found in the care plan section) Acute Rehab PT Goals Patient Stated Goal: to reduce pain and return to independence PT Goal Formulation: With patient Time For Goal Achievement: 10/09/22 Potential to Achieve Goals: Good Progress towards PT goals: Progressing toward goals    Frequency    7X/week      PT Plan Current plan remains appropriate    Co-evaluation              AM-PAC PT "6 Clicks" Mobility   Outcome Measure  Help needed turning from your back to your side while in a flat bed without using bedrails?: A Lot Help needed moving from lying on your back to sitting on the side of a flat bed without using bedrails?: A Lot Help needed moving to and from a bed to a chair (including a wheelchair)?: A Lot Help needed standing up from a chair using your arms (e.g., wheelchair or bedside chair)?: A Lot Help needed to walk in hospital room?: Total Help needed climbing 3-5 steps with a railing? : Total 6 Click Score: 10    End of Session Equipment Utilized During Treatment: Gait  belt Activity Tolerance: Patient limited by pain Patient left: in bed;with call bell/phone within reach;with bed alarm set Nurse Communication: Mobility status PT Visit Diagnosis: Other abnormalities of gait and mobility (R26.89);Muscle weakness (generalized) (M62.81);Pain Pain - Right/Left: Right Pain - part of body: Hip     Time: 1452-1550 PT Time Calculation (min) (ACUTE ONLY): 58 min  Charges:  $Therapeutic Exercise: 8-22 mins $Therapeutic Activity: 38-52 mins                     Michelle Nasuti, PTA Acute Rehabilitation Services Secure Chat Preferred  [phone]    Michelle Nasuti 10/05/2022, 4:10 PM

## 2022-10-06 ENCOUNTER — Encounter (HOSPITAL_COMMUNITY): Payer: Self-pay | Admitting: Orthopaedic Surgery

## 2022-10-06 MED ORDER — SULFAMETHOXAZOLE-TRIMETHOPRIM 800-160 MG PO TABS
1.0000 | ORAL_TABLET | Freq: Two times a day (BID) | ORAL | Status: DC
  Administered 2022-10-06 – 2022-10-08 (×5): 1 via ORAL
  Filled 2022-10-06 (×5): qty 1

## 2022-10-06 NOTE — Progress Notes (Signed)
Physical Therapy Treatment Patient Details Name: Kaylee Salas MRN: [id number] DOB: 03-Dec-1965 Today's Date: 10/06/2022   History of Present Illness 57 y.o. female presents to [facility] hospital on 10/04/2022 for elective R THA. PMH includes anemia, anxiety, depression, OA.    PT Comments    Pt was received supine and agreeable to therapy, but reports feeling sore from completing exercises independently yesterday. Pt continues to require increased time and mod A for bed mobility due to pain and weakness. Pt able to participate in lateral steps at EOB this session, however is unable to clear RLE and slides it on floor. Pt refused to sit in recliner despite heavy encouragement reporting that she does not think that she will be able to return back to bed when she needs to. Will continue to attempt gait progression as able. Pt left sitting EOB with OT present at end of session. Pt continues to benefit from PT services to progress toward functional mobility goals.     Recommendations for follow up therapy are one component of a multi-disciplinary discharge planning process, led by the attending physician.  Recommendations may be updated based on patient status, additional functional criteria and insurance authorization.  Follow Up Recommendations  Follow physician's recommendations for discharge plan and follow up therapies     Assistance Recommended at Discharge PRN  Patient can return home with the following Help with stairs or ramp for entrance;Assist for transportation;Assistance with cooking/housework;A little help with bathing/dressing/bathroom   Equipment Recommendations  Rolling walker (2 wheels)    Recommendations for Other Services       Precautions / Restrictions Precautions Precautions: Fall Restrictions Weight Bearing Restrictions: Yes RLE Weight Bearing: Weight bearing as tolerated     Mobility  Bed Mobility Overal bed mobility: Needs Assistance Bed Mobility: Supine to Sit      Supine to sit: HOB elevated, Mod assist, Max assist     General bed mobility comments: Pt attempted to use the gait belt to move RLE, however pt reports soreness after completing exercises independently last night so she required mod A to move RLE off bed. Pt required mod A for trunk and heavy cues for hand placement and sequencing. Pt was able to lean to attempt to scoot, however required max A with bed pad to scoot R hip forward at EOB.    Transfers Overall transfer level: Needs assistance Equipment used: Rolling walker (2 wheels) Transfers: Sit to/from Stand Sit to Stand: Mod assist           General transfer comment: From EOB to RW. Pt required cues for safe hand and RLE placement. Pt required mod A for power up and heavy cues for upright posture as pt prefers to flex forward and rest forearms on walker.    Ambulation/Gait Ambulation/Gait assistance: Min assist   Assistive device: Rolling walker (2 wheels)       Pre-gait activities: Weight shifting, R heel/toe raises, and 3-4 lateral steps at EOB         Balance Overall balance assessment: Needs assistance Sitting-balance support: Bilateral upper extremity supported, Feet supported Sitting balance-Leahy Scale: Fair Sitting balance - Comments: sitting EOB   Standing balance support: Bilateral upper extremity supported, Reliant on assistive device for balance Standing balance-Leahy Scale: Poor Standing balance comment: with RW support                            Cognition Arousal/Alertness: Awake/alert Behavior During Therapy: WFL for tasks  assessed/performed Overall Cognitive Status: Within Functional Limits for tasks assessed                                          Exercises General Exercises - Lower Extremity Toe Raises: AROM, Standing, Right, 5 reps Heel Raises: AROM, Right, Standing, 5 reps    General Comments General comments (skin integrity, edema, etc.): VSS throughout  session      Pertinent Vitals/Pain Pain Assessment Pain Assessment: Faces Faces Pain Scale: Hurts whole lot Pain Location: R hip Pain Descriptors / Indicators: Grimacing, Guarding Pain Intervention(s): Limited activity within patient's tolerance, Monitored during session, Premedicated before session     PT Goals (current goals can now be found in the care plan section) Acute Rehab PT Goals Patient Stated Goal: to reduce pain and return to independence PT Goal Formulation: With patient Time For Goal Achievement: 10/09/22 Potential to Achieve Goals: Good Progress towards PT goals: Progressing toward goals    Frequency    7X/week      PT Plan Current plan remains appropriate       AM-PAC PT "6 Clicks" Mobility   Outcome Measure  Help needed turning from your back to your side while in a flat bed without using bedrails?: A Lot Help needed moving from lying on your back to sitting on the side of a flat bed without using bedrails?: A Lot Help needed moving to and from a bed to a chair (including a wheelchair)?: A Lot Help needed standing up from a chair using your arms (e.g., wheelchair or bedside chair)?: A Lot Help needed to walk in hospital room?: Total Help needed climbing 3-5 steps with a railing? : Total 6 Click Score: 10    End of Session Equipment Utilized During Treatment: Gait belt Activity Tolerance: Patient limited by pain Patient left: in bed;Other (comment) (sitting EOB with OT present) Nurse Communication: Mobility status PT Visit Diagnosis: Other abnormalities of gait and mobility (R26.89);Muscle weakness (generalized) (M62.81);Pain Pain - Right/Left: Right Pain - part of body: Hip     Time: 1740-8144 PT Time Calculation (min) (ACUTE ONLY): 29 min  Charges:  $Therapeutic Exercise: 8-22 mins $Therapeutic Activity: 8-22 mins                     Michelle Nasuti, PTA Acute Rehabilitation Services Secure Chat Preferred  [phone]     Michelle Nasuti 10/06/2022, 11:55 AM

## 2022-10-06 NOTE — Evaluation (Signed)
Occupational Therapy Evaluation Patient Details Name: Kaylee Salas MRN: [id number] DOB: 1966/03/04 Today's Date: 10/06/2022   History of Present Illness 57 y.o. female presents to [facility] hospital on 10/04/2022 for elective R THA. PMH includes anemia, anxiety, depression, OA.   Clinical Impression   PTA, pt was independent in ADL. Upon eval, pt presents with decreased strength, coordination, safety, and balance. Pt with difficulty advancing RLE off floor for gait, LB ADL, and transferring into and out of bed. Pt with good use of pait belt to attempt to move RLE in bed. Pt requiring mod A for LB ADL and set-up for LB ADL. Pt reports she is usually able to reach down to her feet but is sore today. Currently requiring min-mod A for basic transfers. Recommending follow physicians orders for follow up therapies pending progress, however, given limited support at home, may need short term rehabilitation to optimize safety and independence prior to return to living alone.      Recommendations for follow up therapy are one component of a multi-disciplinary discharge planning process, led by the attending physician.  Recommendations may be updated based on patient status, additional functional criteria and insurance authorization.   Follow Up Recommendations  Follow physician's recommendations for discharge plan and follow up therapies (pending progress may be able to go home with following physicians recommendations. Currently requiring assist for STS and does not have frequent assist at home)     Assistance Recommended at Discharge Frequent or constant Supervision/Assistance  Patient can return home with the following A little help with walking and/or transfers;A lot of help with bathing/dressing/bathroom;Assistance with cooking/housework;Assist for transportation;Help with stairs or ramp for entrance    Functional Status Assessment  Patient has had a recent decline in their functional status and  demonstrates the ability to make significant improvements in function in a reasonable and predictable amount of time.  Equipment Recommendations  BSC/3in1    Recommendations for Other Services       Precautions / Restrictions Precautions Precautions: Fall Restrictions Weight Bearing Restrictions: Yes RLE Weight Bearing: Weight bearing as tolerated      Mobility Bed Mobility Overal bed mobility: Needs Assistance Bed Mobility: Sit to Supine       Sit to supine: Mod assist   General bed mobility comments: to bring BLE back into bed    Transfers Overall transfer level: Needs assistance Equipment used: Rolling walker (2 wheels) Transfers: Sit to/from Stand Sit to Stand: Min assist           General transfer comment: cues for hand placement, increased time and min A for weight shift as well as rise.      Balance Overall balance assessment: Needs assistance Sitting-balance support: Bilateral upper extremity supported, Feet supported Sitting balance-Leahy Scale: Fair Sitting balance - Comments: sitting EOB   Standing balance support: Bilateral upper extremity supported, Reliant on assistive device for balance Standing balance-Leahy Scale: Poor Standing balance comment: requires RW for support.                           ADL either performed or assessed with clinical judgement   ADL Overall ADL's : Needs assistance/impaired Eating/Feeding: Set up;Sitting   Grooming: Set up;Sitting   Upper Body Bathing: Set up;Sitting   Lower Body Bathing: Moderate assistance;Sit to/from stand   Upper Body Dressing : Set up;Sitting   Lower Body Dressing: Moderate assistance;Sit to/from stand Lower Body Dressing Details (indicate cue type and reason): unable to  reach feet today to don socks due to pain. Aware of concept of sock aid and did not want to practice as she feels she will be able to reach her feet when pain is better controlled Toilet Transfer: Minimal  assistance;Stand-pivot;Moderate assistance;Rolling walker (2 wheels);BSC/3in1 Toilet Transfer Details (indicate cue type and reason): Min A with transfer to [facility] on L, but mod A for balance and advancement of RLE moving back to bed on R Toileting- Clothing Manipulation and Hygiene: Sit to/from stand;Min guard Toileting - Clothing Manipulation Details (indicate cue type and reason): min guard A fro rise from elevated BSC; did ultimately require assist for posterior pericare due to attempting reach between legs method but catheter in the way   Tub/Shower Transfer Details (indicate cue type and reason): Pt reporting she will wash up until she is moving better. Would like handout for tub shower transfer to attempt at a signficiantly further out date Functional mobility during ADLs: Minimal assistance;Rolling walker (2 wheels) General ADL Comments: wiggling toes and pivoting foot to advance RLE; unable to lift RLE off floor     Vision   Additional Comments: Pt did not report any vision difficulties or changes, but observed with mid R eye twitchning     Perception     Praxis      Pertinent Vitals/Pain Pain Assessment Pain Assessment: Faces Faces Pain Scale: Hurts whole lot Pain Location: R hip Pain Descriptors / Indicators: Grimacing, Guarding Pain Intervention(s): Limited activity within patient's tolerance, Monitored during session     Hand Dominance Right   Extremity/Trunk Assessment Upper Extremity Assessment Upper Extremity Assessment: Overall WFL for tasks assessed   Lower Extremity Assessment Lower Extremity Assessment: Defer to PT evaluation   Cervical / Trunk Assessment Cervical / Trunk Assessment: Normal   Communication Communication Communication: No difficulties   Cognition Arousal/Alertness: Awake/alert Behavior During Therapy: WFL for tasks assessed/performed Overall Cognitive Status: Within Functional Limits for tasks assessed                                  General Comments: some decreased awareness of progression of recovery and safety due to reporting she will be fine at home. Seems to want to stay at hospital for several more days prior to dc     General Comments  VSS    Exercises Exercises: General Lower Extremity Total Joint Exercises Ankle Circles/Pumps: AROM, Both, 10 reps Quad Sets: Both, 10 reps, Seated, AROM   Shoulder Instructions      Home Living Family/patient expects to be discharged to:: Private residence Living Arrangements: Alone Available Help at Discharge: Family;Friend(s);Available PRN/intermittently (family can assist at night per pt) Type of Home: House Home Access: Stairs to enter [employer] of Steps: 6 Entrance Stairs-Rails: None Home Layout: One level     Bathroom Shower/Tub: Teacher, early years/pre: Standard     Home Equipment: Cane - single point;Rollator (4 wheels);Crutches;Hand held shower head;Adaptive equipment;Toilet riser Adaptive Equipment: Reacher;Sock aid;Long-handled sponge        Prior Functioning/Environment Prior Level of Function : Independent/Modified Independent             Mobility Comments: ambulatory with use of rollator ADLs Comments: Per pt, independent in ADL        OT Problem List: Decreased strength;Decreased activity tolerance;Decreased range of motion;Impaired balance (sitting and/or standing);Decreased coordination;Decreased knowledge of use of DME or AE;Decreased knowledge of precautions;Decreased safety awareness;Pain  OT Treatment/Interventions: Self-care/ADL training;Therapeutic exercise;DME and/or AE instruction;Patient/family education;Balance training;Therapeutic activities    OT Goals(Current goals can be found in the care plan section) Acute Rehab OT Goals Patient Stated Goal: Be able to lift RLE off ground OT Goal Formulation: With patient Time For Goal Achievement: 10/20/22 Potential to Achieve Goals: Good  OT  Frequency: Min 2X/week    Co-evaluation              AM-PAC OT "6 Clicks" Daily Activity     Outcome Measure Help from another person eating meals?: None Help from another person taking care of personal grooming?: A Little Help from another person toileting, which includes using toliet, bedpan, or urinal?: A Lot Help from another person bathing (including washing, rinsing, drying)?: A Lot Help from another person to put on and taking off regular upper body clothing?: A Little Help from another person to put on and taking off regular lower body clothing?: A Lot 6 Click Score: 16   End of Session Equipment Utilized During Treatment: Gait belt;Rolling walker (2 wheels);Other (comment) [facility]) Nurse Communication: Mobility status  Activity Tolerance: Patient tolerated treatment well Patient left: in bed;with call bell/phone within reach;with nursing/sitter in room (RN removing catheter)  OT Visit Diagnosis: Unsteadiness on feet (R26.81);Other abnormalities of gait and mobility (R26.89);Muscle weakness (generalized) (M62.81);Pain Pain - Right/Left: Right Pain - part of body: Hip                Time: 1135-1220 OT Time Calculation (min): 45 min Charges:  OT General Charges $OT Visit: 1 Visit OT Evaluation $OT Eval Low Complexity: 1 Low OT Treatments $Self Care/Home Management : 23-37 mins  Elder Cyphers, OTR/L [facility] Acute Rehabilitation Office: [phone]   Magnus Ivan 10/06/2022, 2:25 PM

## 2022-10-06 NOTE — Progress Notes (Signed)
Patient ID: Kaylee Salas, female   DOB: 08/23/1966, 57 y.o.   MRN: [id number] The patient is very slow to mobilize.  I did relate physical therapy's notes and their recommendation for an OT consult.  I will put an order for OT to see her to work with her ADLs.  She has been made an inpatient appropriately due to her lack of mobility and hopefully will make progress today with the potential for discharge to home tomorrow.

## 2022-10-06 NOTE — Progress Notes (Signed)
Physical Therapy Treatment Patient Details Name: Kaylee Salas MRN: [id number] DOB: 1965-09-21 Today's Date: 10/06/2022   History of Present Illness 57 y.o. female presents to [facility] hospital on 10/04/2022 for elective R THA. PMH includes anemia, anxiety, depression, OA.    PT Comments    Pt was received in supine and agreeable to therapy. Pt continued to require mod A for all bed mobility due to pain and inability to lift RLE. Pt was able to tolerate increased gait distance this session with +2 for safety and intermittent min A for RLE progression. Pt requested to use the [facility] and was able to complete hygiene in standing with min guard. Pt attempted to march RLE in sitting, but required assistance due to decreased hip flexor activation. Pt reports she was able to walk and raise RLE PTA. Pt reports she is trying to arrange 24/7 assistance at home between 2 people, but was not clear if that would be possible. Pt continues to benefit from PT services to progress toward functional mobility goals.      Recommendations for follow up therapy are one component of a multi-disciplinary discharge planning process, led by the attending physician.  Recommendations may be updated based on patient status, additional functional criteria and insurance authorization.  Follow Up Recommendations  Follow physician's recommendations for discharge plan and follow up therapies     Assistance Recommended at Discharge PRN  Patient can return home with the following Help with stairs or ramp for entrance;Assist for transportation;Assistance with cooking/housework;A little help with bathing/dressing/bathroom   Equipment Recommendations  Rolling walker (2 wheels)    Recommendations for Other Services       Precautions / Restrictions Precautions Precautions: Fall Restrictions Weight Bearing Restrictions: Yes RLE Weight Bearing: Weight bearing as tolerated     Mobility  Bed Mobility Overal bed mobility: Needs  Assistance Bed Mobility: Supine to Sit, Sit to Supine     Supine to sit: Mod assist, HOB elevated, +2 for safety/equipment Sit to supine: Mod assist   General bed mobility comments: Mod A for elevation of trunk from supine to sit and mod A for BLE elevation from sit to supine. Pt required significantly increased time due to being very limited by pain with large movements.    Transfers Overall transfer level: Needs assistance Equipment used: Rolling walker (2 wheels) Transfers: Sit to/from Stand Sit to Stand: Min assist           General transfer comment: Cues for hand placement and min A for power up due to LLE weakness and reduced weight shift to RLE.    Ambulation/Gait Ambulation/Gait assistance: Min assist, +2 safety/equipment Gait Distance (Feet): 6 Feet Assistive device: Rolling walker (2 wheels) Gait Pattern/deviations: Step-to pattern, Trunk flexed Gait velocity: reduced   Pre-gait activities: Weight shifting, R heel/toe raises, and 3-4 lateral steps at EOB General Gait Details: Pt continued to be unable to clear floor with RLE and slid foot forward/backward. Pt flexed forward when attempting to progress RLE despite cues for upright posture. Pt able to walk to/from [facility] 15f from bed with min A for RLE progression and balance.       Balance Overall balance assessment: Needs assistance Sitting-balance support: Bilateral upper extremity supported, Feet supported Sitting balance-Leahy Scale: Fair Sitting balance - Comments: sitting EOB   Standing balance support: Bilateral upper extremity supported, Reliant on assistive device for balance Standing balance-Leahy Scale: Fair Standing balance comment: with RW support. Pt able to complete hygiene with unilateral UE support  Cognition Arousal/Alertness: Awake/alert Behavior During Therapy: WFL for tasks assessed/performed Overall Cognitive Status: Within Functional Limits for tasks  assessed                                          Exercises Total Joint Exercises Long Arc Quad: AROM, Right, Seated, 5 reps General Exercises - Lower Extremity Toe Raises: AROM, Standing, Right, 5 reps Heel Raises: AROM, Right, Standing, 5 reps Other Exercises Other Exercises: AAROM marching in sitting RLE x5    General Comments General comments (skin integrity, edema, etc.): VSS      Pertinent Vitals/Pain Pain Assessment Pain Assessment: 0-10 Pain Score: 7  Faces Pain Scale: Hurts whole lot Pain Location: R hip Pain Descriptors / Indicators: Grimacing, Guarding Pain Intervention(s): Limited activity within patient's tolerance, Monitored during session, Ice applied, Repositioned    Home Living Family/patient expects to be discharged to:: Private residence Living Arrangements: Alone Available Help at Discharge: Family;Friend(s);Available PRN/intermittently (family can assist at night per pt) Type of Home: House Home Access: Stairs to enter Entrance Stairs-Rails: None Entrance Stairs-Number of Steps: 6   Home Layout: One level Home Equipment: Cane - single point;Rollator (4 wheels);Crutches;Hand held shower head;Adaptive equipment;Toilet riser      Prior Function            PT Goals (current goals can now be found in the care plan section) Acute Rehab PT Goals Patient Stated Goal: to reduce pain and return to independence PT Goal Formulation: With patient Time For Goal Achievement: 10/09/22 Potential to Achieve Goals: Good Progress towards PT goals: Progressing toward goals (slowly)    Frequency    7X/week      PT Plan Current plan remains appropriate    Co-evaluation              AM-PAC PT "6 Clicks" Mobility   Outcome Measure  Help needed turning from your back to your side while in a flat bed without using bedrails?: A Lot Help needed moving from lying on your back to sitting on the side of a flat bed without using bedrails?:  A Lot Help needed moving to and from a bed to a chair (including a wheelchair)?: A Lot Help needed standing up from a chair using your arms (e.g., wheelchair or bedside chair)?: A Little Help needed to walk in hospital room?: Total Help needed climbing 3-5 steps with a railing? : Total 6 Click Score: 11    End of Session Equipment Utilized During Treatment: Gait belt Activity Tolerance: Patient limited by pain Patient left: in bed;with bed alarm set;with call bell/phone within reach Nurse Communication: Mobility status PT Visit Diagnosis: Other abnormalities of gait and mobility (R26.89);Muscle weakness (generalized) (M62.81);Pain Pain - Right/Left: Right Pain - part of body: Hip     Time: 5701-7793 PT Time Calculation (min) (ACUTE ONLY): 37 min  Charges:  $Gait Training: 8-22 mins $Therapeutic Activity: 8-22 mins                     Michelle Nasuti, PTA Acute Rehabilitation Services Secure Chat Preferred  [phone]    Michelle Nasuti 10/06/2022, 3:36 PM

## 2022-10-06 NOTE — Progress Notes (Signed)
Pt refused foley removal.  Pt stated that she wanted to wait on PT.

## 2022-10-07 NOTE — Progress Notes (Signed)
Physical Therapy Treatment Patient Details Name: Kaylee Salas MRN: [id number] DOB: 14-Nov-1965 Today's Date: 10/07/2022   History of Present Illness 57 y.o. female presents to [facility] hospital on 10/04/2022 for elective R THA. PMH includes anemia, anxiety, depression, OA.    PT Comments    Pt was received in supine and agreeable to therapy, requesting to use BSC upon arrival. Pt able to pivot transfer to [facility] x2 and complete hygiene with min guard for safety. Pt able to increase gait distance in room this session and able to clear R foot throughout most of trial. Pt practiced transfer to chair due to plan to sleep in recliner at home. Pt able to sit and scoot back in chair with min guard, but with complaints of R hip pain due to position in chair and requests to return to bed at end of session. Pt with good efforts this session and reports performing exercises in bed independently to improve mobility. Pt continues to benefit from PT services to progress toward functional mobility goals.    Recommendations for follow up therapy are one component of a multi-disciplinary discharge planning process, led by the attending physician.  Recommendations may be updated based on patient status, additional functional criteria and insurance authorization.  Follow Up Recommendations  Follow physician's recommendations for discharge plan and follow up therapies     Assistance Recommended at Discharge PRN  Patient can return home with the following Help with stairs or ramp for entrance;Assist for transportation;Assistance with cooking/housework;A little help with bathing/dressing/bathroom   Equipment Recommendations  Rolling walker (2 wheels)    Recommendations for Other Services       Precautions / Restrictions Precautions Precautions: Fall Restrictions Weight Bearing Restrictions: Yes RLE Weight Bearing: Weight bearing as tolerated     Mobility  Bed Mobility Overal bed mobility: Needs Assistance Bed  Mobility: Supine to Sit, Sit to Supine     Supine to sit: Min assist, HOB elevated Sit to supine: Mod assist, HOB elevated   General bed mobility comments: MinA-ModA for BLE management. Pt able to scoot up in bed with BUE and BLE with supervision.    Transfers Overall transfer level: Needs assistance Equipment used: Rolling walker (2 wheels) Transfers: Sit to/from Stand Sit to Stand: Min guard           General transfer comment: From EOB to RW, from [facility] to RW, and from recliner to RW.    Ambulation/Gait Ambulation/Gait assistance: Min guard Gait Distance (Feet): 50 Feet Assistive device: Rolling walker (2 wheels) Gait Pattern/deviations: Step-through pattern, Trunk flexed Gait velocity: reduced     General Gait Details: Pt cleared RLE  from floor through most of the gait trial with min guard for safety and cues for close proximity to RW. Pt continued to ambulate with trunk flexed despite cues for upright posture. Pt deferred ambulation in the hallway due to fatigue.       Balance Overall balance assessment: Needs assistance Sitting-balance support: Feet supported, No upper extremity supported Sitting balance-Leahy Scale: Fair Sitting balance - Comments: sitting EOB   Standing balance support: During functional activity, No upper extremity supported Standing balance-Leahy Scale: Fair Standing balance comment: No UE support during hygiene tasks                            Cognition Arousal/Alertness: Awake/alert Behavior During Therapy: WFL for tasks assessed/performed Overall Cognitive Status: Within Functional Limits for tasks assessed  Exercises Total Joint Exercises Marching in Standing: AROM, Right, 5 reps Other Exercises Other Exercises: AROM RLE standing hamstring curls x10    General Comments General comments (skin integrity, edema, etc.): Pt able to use the BSC two times to void and  have BM during session.      Pertinent Vitals/Pain Pain Assessment Pain Assessment: Faces Faces Pain Scale: Hurts little more Pain Location: R hip Pain Descriptors / Indicators: Grimacing, Operative site guarding, Sore Pain Intervention(s): Premedicated before session, Monitored during session, Limited activity within patient's tolerance, Repositioned     PT Goals (current goals can now be found in the care plan section) Acute Rehab PT Goals Patient Stated Goal: to reduce pain and return to independence PT Goal Formulation: With patient Time For Goal Achievement: 10/09/22 Potential to Achieve Goals: Good Progress towards PT goals: Progressing toward goals    Frequency    7X/week      PT Plan Current plan remains appropriate       AM-PAC PT "6 Clicks" Mobility   Outcome Measure  Help needed turning from your back to your side while in a flat bed without using bedrails?: A Little Help needed moving from lying on your back to sitting on the side of a flat bed without using bedrails?: A Little Help needed moving to and from a bed to a chair (including a wheelchair)?: A Little Help needed standing up from a chair using your arms (e.g., wheelchair or bedside chair)?: A Little Help needed to walk in hospital room?: A Little Help needed climbing 3-5 steps with a railing? : Total 6 Click Score: 16    End of Session Equipment Utilized During Treatment: Gait belt Activity Tolerance: Patient limited by fatigue Patient left: in bed;with bed alarm set;with call bell/phone within reach Nurse Communication: Mobility status PT Visit Diagnosis: Other abnormalities of gait and mobility (R26.89);Muscle weakness (generalized) (M62.81);Pain Pain - Right/Left: Right Pain - part of body: Hip     Time: [id number] PT Time Calculation (min) (ACUTE ONLY): 48 min  Charges:  $Gait Training: 23-37 mins $Therapeutic Activity: 8-22 mins                     Michelle Nasuti, PTA Acute  Rehabilitation Services Secure Chat Preferred  [phone]    Michelle Nasuti 10/07/2022, 4:03 PM

## 2022-10-07 NOTE — Plan of Care (Signed)
  Problem: Pain Management: Goal: Pain level will decrease with appropriate interventions Outcome: Progressing   Problem: Activity: Goal: Risk for activity intolerance will decrease Outcome: Progressing   Problem: Coping: Goal: Level of anxiety will decrease Outcome: Progressing

## 2022-10-07 NOTE — Progress Notes (Signed)
Patient ID: Kaylee Salas, female   DOB: Aug 28, 1966, 57 y.o.   MRN: [id number] The patient has had a difficult time with her mobility working with physical therapy.  We did put in a consultation to Occupational Therapy yesterday.  She states that she would rather not go to a skilled nursing center and prefers to be able to be discharged home.  She will need at least a day or 2 more of therapy here in order to decrease her fall risk and help her be safe in terms of discharge to home.

## 2022-10-07 NOTE — Progress Notes (Signed)
Physical Therapy Treatment Patient Details Name: Kaylee Salas MRN: [id number] DOB: [birth year] Today's Date: 10/07/2022   History of Present Illness 57 y.o. female presents to [facility] hospital on 10/04/2022 for elective R THA. PMH includes anemia, anxiety, depression, OA.    PT Comments    Pt was receiving in supine and agreeable to therapy, but reporting nausea and headache. Pt was encouraged to order lunch as she reported not eating dinner last night or breakfast this morning. Pt significantly improved bed mobility requiring decreased time and up to min guard to sit EOB. Pt appeared to demonstrate improved RLE hip flexor activation this session and was almost able to clear R foot off of floor during ambulation. Plan to progress gait distance and practice chair transfers next session. Pt continues to benefit from PT services to progress toward functional mobility goals.      Recommendations for follow up therapy are one component of a multi-disciplinary discharge planning process, led by the attending physician.  Recommendations may be updated based on patient status, additional functional criteria and insurance authorization.  Follow Up Recommendations  Follow physician's recommendations for discharge plan and follow up therapies     Assistance Recommended at Discharge PRN  Patient can return home with the following Help with stairs or ramp for entrance;Assist for transportation;Assistance with cooking/housework;A little help with bathing/dressing/bathroom   Equipment Recommendations  Rolling walker (2 wheels)    Recommendations for Other Services       Precautions / Restrictions Precautions Precautions: Fall Restrictions Weight Bearing Restrictions: Yes RLE Weight Bearing: Weight bearing as tolerated     Mobility  Bed Mobility Overal bed mobility: Needs Assistance Bed Mobility: Supine to Sit, Sit to Supine     Supine to sit: Min guard Sit to supine: Mod assist   General bed  mobility comments: Pt was able to perform sit to supine with min guard and improved ability to scoot anteriorly at EOB. Pt required mod A to elevate BLE to return to supine. Pt with improved ability to scoot up in bed with min A with bed pad    Transfers Overall transfer level: Needs assistance Equipment used: Rolling walker (2 wheels) Transfers: Sit to/from Stand Sit to Stand: Min guard           General transfer comment: From EOB to RW. Pt with good carryover of safe hand and LE placement and improved ability to achieve upright posture once standing.    Ambulation/Gait Ambulation/Gait assistance: Min assist, Min guard Gait Distance (Feet): 10 Feet Assistive device: Rolling walker (2 wheels) Gait Pattern/deviations: Step-to pattern, Trunk flexed Gait velocity: reduced     General Gait Details: Pt demonstrated improved ability to raise RLE to almost clear the floor during ambulation. Pt was able to bend knee and perform small march with RLE between steps. Pt initially needed min A to progress RLE, however progressed to min guard with improved ability to lift R foot and slide toes forward.        Balance Overall balance assessment: Needs assistance Sitting-balance support: Bilateral upper extremity supported, Feet supported Sitting balance-Leahy Scale: Fair Sitting balance - Comments: sitting EOB   Standing balance support: Bilateral upper extremity supported, Reliant on assistive device for balance Standing balance-Leahy Scale: Fair Standing balance comment: with RW support.                            Cognition Arousal/Alertness: Awake/alert Behavior During Therapy: WFL for tasks assessed/performed  Overall Cognitive Status: Within Functional Limits for tasks assessed                                          Exercises Total Joint Exercises Marching in Standing: AROM, Right, 5 reps Other Exercises Other Exercises: AROM RLE standing  hamstring curls x10    General Comments General comments (skin integrity, edema, etc.): Pt reported nausea and headache at beginning of session and stated that she had not eaten dinner last night or breakfast this morning. Pt was encouraged to order lunch and RN was notified.      Pertinent Vitals/Pain Pain Assessment Pain Assessment: Faces Faces Pain Scale: Hurts even more Pain Location: R hip Pain Descriptors / Indicators: Grimacing, Operative site guarding Pain Intervention(s): Limited activity within patient's tolerance, Monitored during session, Ice applied, Repositioned, Premedicated before session     PT Goals (current goals can now be found in the care plan section) Acute Rehab PT Goals Patient Stated Goal: to reduce pain and return to independence PT Goal Formulation: With patient Time For Goal Achievement: 10/09/22 Potential to Achieve Goals: Good Progress towards PT goals: Progressing toward goals    Frequency    7X/week      PT Plan Current plan remains appropriate       AM-PAC PT "6 Clicks" Mobility   Outcome Measure  Help needed turning from your back to your side while in a flat bed without using bedrails?: A Little Help needed moving from lying on your back to sitting on the side of a flat bed without using bedrails?: A Little Help needed moving to and from a bed to a chair (including a wheelchair)?: A Little Help needed standing up from a chair using your arms (e.g., wheelchair or bedside chair)?: A Little Help needed to walk in hospital room?: A Little Help needed climbing 3-5 steps with a railing? : Total 6 Click Score: 16    End of Session Equipment Utilized During Treatment: Gait belt Activity Tolerance: Patient limited by fatigue Patient left: in bed;with bed alarm set;with call bell/phone within reach Nurse Communication: Mobility status PT Visit Diagnosis: Other abnormalities of gait and mobility (R26.89);Muscle weakness (generalized)  (M62.81);Pain Pain - Right/Left: Right Pain - part of body: Hip     Time: [id number] PT Time Calculation (min) (ACUTE ONLY): 29 min  Charges:  $Gait Training: 8-22 mins $Therapeutic Activity: 8-22 mins                     Michelle Nasuti, PTA Acute Rehabilitation Services Secure Chat Preferred  Office:([phone]    Michelle Nasuti 10/07/2022, 12:59 PM

## 2022-10-08 MED ORDER — TIZANIDINE HCL 4 MG PO TABS: 4.0000 mg | ORAL_TABLET | Freq: Four times a day (QID) | ORAL | 0 refills | Status: DC | PRN

## 2022-10-08 MED ORDER — SULFAMETHOXAZOLE-TRIMETHOPRIM 800-160 MG PO TABS: 1.0000 | ORAL_TABLET | Freq: Two times a day (BID) | ORAL | 0 refills | Status: DC

## 2022-10-08 MED ORDER — OXYCODONE HCL 5 MG PO TABS: 5.0000 mg | ORAL_TABLET | ORAL | 0 refills | Status: DC | PRN

## 2022-10-08 NOTE — Progress Notes (Signed)
RNCM received orders for HHPT.  Patient with no preference so Kaylee Salas at [facility] contacted with orders and confirmation received.  Patient in agreement with  services ordered.

## 2022-10-08 NOTE — Progress Notes (Signed)
Pt asked for a walker that was ordered. Pt wants to go home now. She is refusing to wait. Per pt she has one walker at home. It is higher but sh still can use it. [employer]. Is aware.

## 2022-10-08 NOTE — Progress Notes (Signed)
Physical Therapy Treatment Patient Details Name: Kaylee Salas MRN: [id number] DOB: February 20, 1966 Today's Date: 10/08/2022   History of Present Illness 57 y.o. female presents to [facility] hospital on 10/04/2022 for elective R THA. PMH includes anemia, anxiety, depression, OA.    PT Comments    Pt received in supine, initially agreeable to therapy session but after discussion, pt defers EOB/OOB transfer training or stair negotiation practice and reports confidence she will be able to mobilize safely into home with assist from friend who is driving her home. Pt given handout on energy conservation and THA HEP handout to reinforce safe technique and activity pacing/modifications for safety to reduce fatigue. Pt typically lives with spouse but did not state who would be at home to help her during the day, anticipate she may be alone upon DC after friend drops her off. She would benefit from max [facility] services upon DC and increased PRN supervision/assist for mobility/home management. Emphasis on use of ice to assist with pain/edema mgmt, benefits of mobility/HEP compliance, use of gait belt, car transfer and stair sequencing/safety. PTA offered to assist pt with transfer OOB to chair so she could get dressed before her transportation/DME arrives to room but she defers. Pt continues to benefit from PT services to progress toward functional mobility goals.   Recommendations for follow up therapy are one component of a multi-disciplinary discharge planning process, led by the attending physician.  Recommendations may be updated based on patient status, additional functional criteria and insurance authorization.  Follow Up Recommendations  Follow physician's recommendations for discharge plan and follow up therapies     Assistance Recommended at Discharge PRN  Patient can return home with the following Help with stairs or ramp for entrance;Assist for transportation;Assistance with cooking/housework;A little help with  bathing/dressing/bathroom   Equipment Recommendations  Rolling walker (2 wheels)    Recommendations for Other Services       Precautions / Restrictions Precautions Precautions: Fall Restrictions Weight Bearing Restrictions: Yes RLE Weight Bearing: Weight bearing as tolerated     Mobility  Bed Mobility Overal bed mobility: Needs Assistance             General bed mobility comments: pt defers EOB/OOB or chair transfers despite encouragement. Pt reports recently transferring back to bed without physical assist.    Transfers                   General transfer comment: pt defers    Ambulation/Gait               General Gait Details: pt defers   Stairs             Wheelchair Mobility    Modified Rankin (Stroke Patients Only)       Balance                                            Cognition Arousal/Alertness: Awake/alert Behavior During Therapy: WFL for tasks assessed/performed Overall Cognitive Status: Within Functional Limits for tasks assessed                                 General Comments: Pt self-directed and reports no concerns re: discharge but receptive to some instruction from PTA on use of ice packs for pain/edema mgmt, energy conservation (handout given to reinforce), and  benefits of mobility. Pt reports her HEP handout had gotten dirty and was agreeable to PTA bringing a new copy to her room, but defers to practice during session, states "I've already been moving my legs a lot this morning and I was able to use the gait belt (leg lifter) finally to get in/out of bed on my own". Pt instructed on safe stair sequencing but defers to practice, stating "I worry if I practice all that here before I go home I won't be able to do it again at home due to pain" and also reports not concerned about her ability to go home on her own and perform her iADLs/home chores and tasks in this case.        Exercises  Other Exercises Other Exercises: verbal review, pt reports confidence with technique from previous sessions, new handout printed and given since she states her previous copy got food spilled on it. Also pt given handout on energy conservation.    General Comments General comments (skin integrity, edema, etc.): VSS on RA at rest, no acute s/sx distress, pt reports not wanting to become too fatigue/in pain and to save her energy, pt instructed on use of ice packs/frequency and how to refill them and able to demo back opening/closing clasp for ice.      Pertinent Vitals/Pain Pain Assessment Pain Assessment: Faces Faces Pain Scale: Hurts little more Pain Location: R hip Pain Descriptors / Indicators: Grimacing, Operative site guarding, Sore Pain Intervention(s): Monitored during session, Limited activity within patient's tolerance, Premedicated before session     PT Goals (current goals can now be found in the care plan section) Acute Rehab PT Goals Patient Stated Goal: to reduce pain and return to independence PT Goal Formulation: With patient Time For Goal Achievement: 10/09/22 Progress towards PT goals: Progressing toward goals    Frequency    7X/week      PT Plan Current plan remains appropriate       AM-PAC PT "6 Clicks" Mobility   Outcome Measure  Help needed turning from your back to your side while in a flat bed without using bedrails?: A Little Help needed moving from lying on your back to sitting on the side of a flat bed without using bedrails?: A Little Help needed moving to and from a bed to a chair (including a wheelchair)?: A Little Help needed standing up from a chair using your arms (e.g., wheelchair or bedside chair)?: A Little Help needed to walk in hospital room?: A Little Help needed climbing 3-5 steps with a railing? : A Lot (anticipated based on pt current mobility, pt refusing to attempt at this time) 6 Click Score: 17    End of Session Equipment  Utilized During Treatment: Gait belt;Other (comment) (pt using gait belt in supine for repositioning of RLE intermittently) Activity Tolerance: Patient limited by fatigue;Patient limited by pain Patient left: in bed;with call bell/phone within reach;with bed alarm set;Other (comment) (HOB increased to >30* for reduced risk of aspiration as pt working on eating breakfast) Nurse Communication: Mobility status;Other (comment) (pt refusing OOB mobility during session) PT Visit Diagnosis: Other abnormalities of gait and mobility (R26.89);Muscle weakness (generalized) (M62.81);Pain Pain - Right/Left: Right Pain - part of body: Hip     Time: 1020-1040 PT Time Calculation (min) (ACUTE ONLY): 20 min  Charges:  $Therapeutic Activity: 8-22 mins                     Alamin Mccuiston P., PTA Acute Rehabilitation  Services Secure Chat Preferred 9a-5:30pm Office: [place] 10/08/2022, 11:09 AM

## 2022-10-08 NOTE — Progress Notes (Addendum)
DME was able to be delivered to patient's room prior to patient leaving.  [employer] representative met patient before she actually left the hospital to deliver the equipment to her.

## 2022-10-08 NOTE — Progress Notes (Signed)
RNCM contacted Kaylee Salas at [facility] for walker and 3N1 orders-DME to be delivered to patient's room prior to discharge today.

## 2022-10-08 NOTE — Progress Notes (Signed)
Patient ID: Kaylee Salas, female   DOB: September 18, 1965, 57 y.o.   MRN: [id number] The patient continues to make progress with her mobility.  There is come along slowly but she is getting there.  She is now able to get out of bed and perform her actives of daily living.  She expresses a desire to go home today.  I do feel that is reasonable.  Will see if we can get [facility] to deliver some medications to her room for going home.  Her vital signs are stable and her right hip is stable.

## 2022-10-08 NOTE — Plan of Care (Signed)
  Problem: Activity: Goal: Ability to avoid complications of mobility impairment will improve Outcome: Progressing   Problem: Activity: Goal: Ability to tolerate increased activity will improve Outcome: Progressing   Problem: Pain Management: Goal: Pain level will decrease with appropriate interventions Outcome: Progressing

## 2022-10-08 NOTE — Discharge Summary (Signed)
Patient ID: Kaylee Salas MRN: [id number] DOB/AGE: 1966/06/28 57 y.o.  Admit date: 10/04/2022 Discharge date: 10/08/2022  Admission Diagnoses:  Principal Problem:   Unilateral primary osteoarthritis, right hip Active Problems:   Status post total replacement of right hip   Discharge Diagnoses:  Same  Past Medical History:  Diagnosis Date   Anemia    Anxiety    Arthritis    Complication of anesthesia    "woke up panicking/fighting"   Depression    Osteoporosis    PONV (postoperative nausea and vomiting)    Sacroiliac joint disease    Unilateral primary osteoarthritis, left hip 10/30/2018    Surgeries: Procedure(s): RIGHT TOTAL HIP ARTHROPLASTY ANTERIOR APPROACH on 10/04/2022   Consultants:   Discharged Condition: Improved  Hospital Course: Kaylee Salas is an 57 y.o. female who was admitted 10/04/2022 for operative treatment ofUnilateral primary osteoarthritis, right hip. Patient has severe unremitting pain that affects sleep, daily activities, and work/hobbies. After pre-op clearance the patient was taken to the operating room on 10/04/2022 and underwent  Procedure(s): RIGHT TOTAL HIP ARTHROPLASTY ANTERIOR APPROACH.    Patient was given perioperative antibiotics:  Anti-infectives (From admission, onward)    Start     Dose/Rate Route Frequency Ordered Stop   10/08/22 0000  sulfamethoxazole-trimethoprim (BACTRIM DS) 800-160 MG tablet        1 tablet Oral Every 12 hours 10/08/22 0942     10/06/22 1015  sulfamethoxazole-trimethoprim (BACTRIM DS) 800-160 MG per tablet 1 tablet        1 tablet Oral Every 12 hours 10/06/22 0925     10/04/22 2200  vancomycin (VANCOCIN) IVPB 1000 mg/200 mL premix        1,000 mg 200 mL/hr over 60 Minutes Intravenous Every 12 hours 10/04/22 1559 10/04/22 2227   10/04/22 1200  vancomycin (VANCOCIN) IVPB 1000 mg/200 mL premix        1,000 mg 200 mL/hr over 60 Minutes Intravenous  Once 10/04/22 1005 10/04/22 1142   10/04/22 1015  ceFAZolin (ANCEF)  IVPB 2g/100 mL premix        2 g 200 mL/hr over 30 Minutes Intravenous On call to O.R. 10/04/22 1005 10/04/22 1324        Patient was given sequential compression devices, early ambulation, and chemoprophylaxis to prevent DVT.  Patient benefited maximally from hospital stay and there were no complications.    Recent vital signs: Patient Vitals for the past 24 hrs:  BP Temp Temp src Pulse Resp SpO2  10/08/22 0732 (!) 108/52 -- -- 88 16 95 %  10/08/22 0416 (!) 99/52 99 F (37.2 C) Oral 100 18 95 %  10/08/22 0118 (!) 90/47 98.8 F (37.1 C) Oral 85 16 95 %  10/07/22 2238 (!) 99/51 99 F (37.2 C) Oral (!) 112 18 98 %  10/07/22 2004 (!) 93/44 99.4 F (37.4 C) -- (!) 101 18 99 %  10/07/22 1358 (!) 96/53 98.6 F (37 C) Oral 99 17 99 %     Recent laboratory studies: No results for input(s): "WBC", "HGB", "HCT", "PLT", "NA", "K", "CL", "CO2", "BUN", "CREATININE", "GLUCOSE", "INR", "CALCIUM" in the last 72 hours.  Invalid input(s): "PT", "2"   Discharge Medications:   Allergies as of 10/08/2022   No Known Allergies      Medication List     STOP taking these medications    traMADol 50 MG tablet Commonly known as: ULTRAM       TAKE these medications    cyanocobalamin 1000 MCG/ML  injection Commonly known as: VITAMIN B12 Inject 1 mL (1,000 mcg total) into the muscle every 21 ( twenty-one) days.   DULoxetine 60 MG capsule Commonly known as: CYMBALTA Take 120 mg by mouth daily.   GOODY HEADACHE PO Take 2 Packages by mouth 2 (two) times daily as needed (headache).   hydrOXYzine 25 MG tablet Commonly known as: ATARAX Take 25 mg by mouth 2 (two) times daily.   ICY HOT MEDICATED SPRAY EX Apply 1 spray topically daily as needed (pain).   Linzess 290 MCG Caps capsule Generic drug: linaclotide Take 290 mcg by mouth daily before breakfast.   Needles & Syringes Misc Please dispense adequate needle and syringe supplies for patient to self administer vitamin B12 injection  every 3 weeks at home.   ondansetron 4 MG disintegrating tablet Commonly known as: ZOFRAN-ODT Take 4 mg by mouth every 8 (eight) hours as needed for vomiting or nausea.   oxyCODONE 5 MG immediate release tablet Commonly known as: Oxy IR/ROXICODONE Take 1-2 tablets (5-10 mg total) by mouth every 4 (four) hours as needed for moderate pain (pain score 4-6).   rOPINIRole 1 MG tablet Commonly known as: REQUIP Take 1-2 mg by mouth at bedtime as needed (restless leg).   sulfamethoxazole-trimethoprim 800-160 MG tablet Commonly known as: BACTRIM DS Take 1 tablet by mouth every 12 (twelve) hours.   tiZANidine 4 MG tablet Commonly known as: ZANAFLEX Take 1 tablet (4 mg total) by mouth every 6 (six) hours as needed for muscle spasms. What changed: when to take this   Voltaren 1 % Gel Generic drug: diclofenac Sodium Apply 2 g topically daily as needed (knee pain).               Durable Medical Equipment  (From admission, onward)           Start     Ordered   10/04/22 1600  DME 3 n 1  Once        10/04/22 1559   10/04/22 1600  DME Walker rolling  Once       Question Answer Comment  Walker: With 5 Inch Wheels   Patient needs a walker to treat with the following condition Status post total replacement of right hip      10/04/22 1559            Diagnostic Studies: DG Pelvis Portable  Result Date: 10/04/2022 CLINICAL DATA:  Right hip replacement. EXAM: PORTABLE PELVIS 1-2 VIEWS COMPARISON:  Right hip x-rays dated December 01, 2021. FINDINGS: The right hip demonstrates interval total arthroplasty without evidence of hardware failure or complication. There is no fracture or dislocation. The alignment is anatomic. Post-surgical changes noted in the surrounding soft tissues. Prior left total hip arthroplasty. IMPRESSION: 1. Interval right total hip arthroplasty without acute postoperative complication. Electronically Signed   By: Titus Dubin M.D.   On: 10/04/2022 15:16   DG HIP  UNILAT WITH PELVIS 1V RIGHT  Result Date: 10/04/2022 CLINICAL DATA:  Fluoro guidance provided EXAM: DG HIP (WITH OR WITHOUT PELVIS) 1V RIGHT FINDINGS: Dose: 2.6 mGy Fluoro time 23s IMPRESSION: C-arm fluoro guidance provided. Electronically Signed   By: Sammie Bench M.D.   On: 10/04/2022 14:23   DG C-Arm 1-60 Min-No Report  Result Date: 10/04/2022 Fluoroscopy was utilized by the requesting physician.  No radiographic interpretation.    Disposition: Discharge disposition: 01-Home or Self Care          Follow-up Information     Mcarthur Rossetti,  MD. Daphane Shepherd on 10/17/2022.   Specialty: Orthopedic Surgery Why: at 3:30 pm for your first post op appointment with Dr. Ninfa Linden in his office. Contact information: [address] [place] [address] [phone]                  Signed: Mcarthur Rossetti 10/08/2022, 9:43 AM

## 2022-10-08 NOTE — Progress Notes (Signed)
Pt's DME are delivered just prior pt's leave.

## 2022-10-08 NOTE — Progress Notes (Signed)
Discharge instructions are given to the pt. All questions answered.

## 2022-10-10 ENCOUNTER — Telehealth: Payer: Self-pay | Admitting: *Deleted

## 2022-10-10 ENCOUNTER — Other Ambulatory Visit: Payer: Self-pay

## 2022-10-10 ENCOUNTER — Encounter: Payer: Self-pay | Admitting: Orthopaedic Surgery

## 2022-10-10 DIAGNOSIS — Z96641 Presence of right artificial hip joint: Secondary | ICD-10-CM

## 2022-10-10 NOTE — Telephone Encounter (Signed)
Patient discharged from hospital on Saturday 10/08/22- Attempted to reach her for D/C call. No answer. Unable to leave VM due to mailbox full. Will try again.

## 2022-10-11 ENCOUNTER — Encounter: Payer: 59 | Admitting: Physician Assistant

## 2022-10-11 DIAGNOSIS — M1612 Unilateral primary osteoarthritis, left hip: Secondary | ICD-10-CM

## 2022-10-11 DIAGNOSIS — R768 Other specified abnormal immunological findings in serum: Secondary | ICD-10-CM

## 2022-10-11 DIAGNOSIS — M2550 Pain in unspecified joint: Secondary | ICD-10-CM

## 2022-10-11 DIAGNOSIS — Z96651 Presence of right artificial knee joint: Secondary | ICD-10-CM

## 2022-10-11 DIAGNOSIS — Z96642 Presence of left artificial hip joint: Secondary | ICD-10-CM

## 2022-10-12 ENCOUNTER — Telehealth: Payer: Self-pay | Admitting: *Deleted

## 2022-10-12 NOTE — Telephone Encounter (Signed)
Ortho bundle D/C call attempted.

## 2022-10-13 ENCOUNTER — Other Ambulatory Visit (INDEPENDENT_AMBULATORY_CARE_PROVIDER_SITE_OTHER): Payer: Self-pay | Admitting: Orthopaedic Surgery

## 2022-10-13 ENCOUNTER — Other Ambulatory Visit: Payer: Self-pay | Admitting: Orthopaedic Surgery

## 2022-10-13 ENCOUNTER — Telehealth: Payer: Self-pay | Admitting: *Deleted

## 2022-10-13 NOTE — Telephone Encounter (Signed)
Ortho bundle 7 day call completed.

## 2022-10-13 NOTE — Telephone Encounter (Signed)
Attempted again to reach patient for discharge and 1 week post op call. No answer and mailbox full.

## 2022-10-14 MED ORDER — TIZANIDINE HCL 4 MG PO TABS: 4.0000 mg | ORAL_TABLET | Freq: Four times a day (QID) | ORAL | 0 refills | Status: DC | PRN

## 2022-10-14 MED ORDER — OXYCODONE HCL 5 MG PO TABS: 5.0000 mg | ORAL_TABLET | ORAL | 0 refills | Status: DC | PRN

## 2022-10-17 ENCOUNTER — Ambulatory Visit (INDEPENDENT_AMBULATORY_CARE_PROVIDER_SITE_OTHER): Payer: Medicare Other | Admitting: Physician Assistant

## 2022-10-17 ENCOUNTER — Other Ambulatory Visit: Payer: Self-pay | Admitting: Physician Assistant

## 2022-10-17 ENCOUNTER — Encounter: Payer: Self-pay | Admitting: Physician Assistant

## 2022-10-17 VITALS — Ht 61.0 in | Wt 190.0 lb

## 2022-10-17 DIAGNOSIS — Z96641 Presence of right artificial hip joint: Secondary | ICD-10-CM

## 2022-10-17 MED ORDER — OXYCODONE HCL 5 MG PO TABS: 5.0000 mg | ORAL_TABLET | ORAL | 0 refills | Status: DC | PRN

## 2022-10-17 NOTE — Progress Notes (Signed)
HPI: [name] returns today status post right total hip arthroplasty.  She states that overall she is doing well.  She is taking oxycodone as needed for pain.  She ranks her pain to be 6 out of 10 at worst.  She is not been taking anything for DVT prophylaxis.  Denies any fevers chills or shortness of breath.   Physical exam: General well-developed well-nourished female no acute distress mood and affect appropriate.  Right hip: Surgical incisions well-approximated with interrupted nylon sutures.  No evidence of seroma or infection.  Right calf supple nontender.  Dorsiflexion plantarflexion right ankle intact.  Good range of motion right hip without pain.  Impression: Status post right total hip arthroplasty 10/04/2022  Plan: Will have her take an 81 mg once daily for another week and then discontinue.  Sutures were harvested Steri-Strips applied.  Scar tissue mobilization encouraged.  Follow-up with [place] in 1 month sooner if there is any questions or concerns.  Refill on her oxycodone was given.

## 2022-10-20 ENCOUNTER — Other Ambulatory Visit: Payer: Self-pay | Admitting: Orthopaedic Surgery

## 2022-10-20 MED ORDER — TIZANIDINE HCL 4 MG PO TABS: 4.0000 mg | ORAL_TABLET | Freq: Four times a day (QID) | ORAL | 0 refills | Status: DC | PRN

## 2022-10-21 ENCOUNTER — Ambulatory Visit: Payer: 59 | Admitting: Physical Medicine & Rehabilitation

## 2022-10-21 ENCOUNTER — Other Ambulatory Visit: Payer: Self-pay | Admitting: Orthopaedic Surgery

## 2022-10-21 MED ORDER — OXYCODONE HCL 5 MG PO TABS: 5.0000 mg | ORAL_TABLET | ORAL | 0 refills | Status: DC | PRN

## 2022-10-24 ENCOUNTER — Telehealth: Payer: Self-pay | Admitting: "Endocrinology

## 2022-10-24 DIAGNOSIS — R7989 Other specified abnormal findings of blood chemistry: Secondary | ICD-10-CM

## 2022-10-24 NOTE — Telephone Encounter (Signed)
Lab orders updated and sent.

## 2022-10-24 NOTE — Telephone Encounter (Signed)
Can you update labs 

## 2022-10-26 ENCOUNTER — Other Ambulatory Visit: Payer: Self-pay | Admitting: Orthopaedic Surgery

## 2022-10-26 MED ORDER — TIZANIDINE HCL 4 MG PO TABS: 4.0000 mg | ORAL_TABLET | Freq: Four times a day (QID) | ORAL | 0 refills | Status: DC | PRN

## 2022-10-26 MED ORDER — OXYCODONE HCL 5 MG PO TABS: 5.0000 mg | ORAL_TABLET | ORAL | 0 refills | Status: DC | PRN

## 2022-10-26 NOTE — Progress Notes (Signed)
Follow up cancelled per patient and was to call back for appointment.

## 2022-10-28 DIAGNOSIS — R7989 Other specified abnormal findings of blood chemistry: Secondary | ICD-10-CM | POA: Diagnosis not present

## 2022-10-29 LAB — TSH: TSH: 2.13 u[IU]/mL (ref 0.450–4.500)

## 2022-10-29 LAB — T3, FREE: T3, Free: 2.8 pg/mL (ref 2.0–4.4)

## 2022-10-29 LAB — T4, FREE: Free T4: 0.87 ng/dL (ref 0.82–1.77)

## 2022-10-31 ENCOUNTER — Ambulatory Visit: Payer: Self-pay | Admitting: "Endocrinology

## 2022-11-01 ENCOUNTER — Other Ambulatory Visit: Payer: Self-pay | Admitting: Orthopaedic Surgery

## 2022-11-01 MED ORDER — TIZANIDINE HCL 4 MG PO TABS: 4.0000 mg | ORAL_TABLET | Freq: Four times a day (QID) | ORAL | 0 refills | Status: DC | PRN

## 2022-11-01 MED ORDER — OXYCODONE HCL 5 MG PO TABS: 5.0000 mg | ORAL_TABLET | ORAL | 0 refills | Status: DC | PRN

## 2022-11-03 ENCOUNTER — Telehealth: Payer: Self-pay | Admitting: Physical Medicine & Rehabilitation

## 2022-11-03 ENCOUNTER — Encounter: Payer: Self-pay | Admitting: Radiology

## 2022-11-03 NOTE — Telephone Encounter (Signed)
She called and rescheduled her right sacroiliac procedure because she's still recovering from surgery and can't lay on the table. But she had a lot of questions regarding a RF SI she had done in the past on her right and left side she said it was the L5, SI,S2,S3. Can someone reach out to her and let her explain what she's needing.

## 2022-11-06 ENCOUNTER — Other Ambulatory Visit: Payer: Self-pay | Admitting: Orthopaedic Surgery

## 2022-11-07 MED ORDER — OXYCODONE HCL 5 MG PO TABS: 5.0000 mg | ORAL_TABLET | ORAL | 0 refills | Status: DC | PRN

## 2022-11-08 ENCOUNTER — Ambulatory Visit: Payer: 59 | Admitting: Physical Medicine & Rehabilitation

## 2022-11-08 NOTE — Progress Notes (Unsigned)
Office Visit Note  Patient: Kaylee Salas             Date of Birth: 12-May-1966           MRN: [id number]             PCP: Emelda Fear, DO Referring: Harriett Rush, * Visit Date: 11/22/2022 Occupation: @GUAROCC @  Subjective:  +RF  History of Present Illness: Kaylee Salas is a 57 y.o. female who presents today for a new patient consultation.      Activities of Daily Living:  Patient reports morning stiffness for *** {minute/hour:19697}.   Patient {ACTIONS;DENIES/REPORTS:21021675::"Denies"} nocturnal pain.  Difficulty dressing/grooming: {ACTIONS;DENIES/REPORTS:21021675::"Denies"} Difficulty climbing stairs: {ACTIONS;DENIES/REPORTS:21021675::"Denies"} Difficulty getting out of chair: {ACTIONS;DENIES/REPORTS:21021675::"Denies"} Difficulty using hands for taps, buttons, cutlery, and/or writing: {ACTIONS;DENIES/REPORTS:21021675::"Denies"}  No Rheumatology ROS completed.   PMFS History:  Patient Active Problem List   Diagnosis Date Noted  . Status post total replacement of right hip 10/04/2022  . Iron deficiency anemia 07/18/2022  . OA (osteoarthritis) of knee 02/08/2022  . Status post total right knee replacement 02/08/2022  . Abnormal thyroid blood test 10/15/2021  . Unilateral primary osteoarthritis, right hip 03/24/2021  . Status post total replacement of left hip 10/30/2018    Past Medical History:  Diagnosis Date  . Anemia   . Anxiety   . Arthritis   . Complication of anesthesia    "woke up panicking/fighting"  . Depression   . Osteoporosis   . PONV (postoperative nausea and vomiting)   . Sacroiliac joint disease   . Unilateral primary osteoarthritis, left hip 10/30/2018    Family History  Problem Relation Age of Onset  . Hypertension Mother   . Hyperlipidemia Mother   . Heart attack Mother   . Heart attack Father   . Hyperlipidemia Father   . Hypertension Father   . Heart failure Father    Past Surgical History:  Procedure Laterality Date   . CHOLECYSTECTOMY  2004  . GASTRIC BYPASS    . HAND SURGERY Right    ring finger reconstruction  . HERNIA REPAIR    . KNEE ARTHROSCOPY W/ ACL RECONSTRUCTION Right 1997  . TOTAL HIP ARTHROPLASTY Left 10/30/2018   Procedure: LEFT TOTAL HIP ARTHROPLASTY ANTERIOR APPROACH;  Surgeon: Mcarthur Rossetti, MD;  Location: [facility];  Service: Orthopedics;  Laterality: Left;  . TOTAL HIP ARTHROPLASTY Right 10/04/2022   Procedure: RIGHT TOTAL HIP ARTHROPLASTY ANTERIOR APPROACH;  Surgeon: Mcarthur Rossetti, MD;  Location: [facility];  Service: Orthopedics;  Laterality: Right;  . TOTAL KNEE ARTHROPLASTY Right 02/08/2022   Procedure: RIGHT TOTAL KNEE ARTHROPLASTY;  Surgeon: Mcarthur Rossetti, MD;  Location: [facility];  Service: Orthopedics;  Laterality: Right;  . UMBILICAL HERNIA REPAIR  2004   Social History   Social History Narrative  . Not on file    There is no immunization history on file for this patient.   Objective: Vital Signs: There were no vitals taken for this visit.   Physical Exam Vitals and nursing note reviewed.  Constitutional:      Appearance: She is well-developed.  HENT:     Head: Normocephalic and atraumatic.  Eyes:     Conjunctiva/sclera: Conjunctivae normal.  Cardiovascular:     Rate and Rhythm: Normal rate and regular rhythm.     Heart sounds: Normal heart sounds.  Pulmonary:     Effort: Pulmonary effort is normal.     Breath sounds: Normal breath sounds.  Abdominal:     General: Bowel  sounds are normal.     Palpations: Abdomen is soft.  Musculoskeletal:     Cervical back: Normal range of motion.  Skin:    General: Skin is warm and dry.     Capillary Refill: Capillary refill takes less than 2 seconds.  Neurological:     Mental Status: She is alert and oriented to person, place, and time.  Psychiatric:        Behavior: Behavior normal.     Musculoskeletal Exam: ***  CDAI Exam: CDAI Score: -- Patient Global: --; Provider Global: -- Swollen: --;  Tender: -- Joint Exam 11/22/2022   No joint exam has been documented for this visit   There is currently no information documented on the homunculus. Go to the Rheumatology activity and complete the homunculus joint exam.  Investigation: No additional findings.  Imaging: No results found.  Recent Labs: Lab Results  Component Value Date   WBC 5.8 10/05/2022   HGB 9.6 (L) 10/05/2022   PLT 208 10/05/2022   NA 136 10/05/2022   K 3.6 10/05/2022   CL 101 10/05/2022   CO2 26 10/05/2022   GLUCOSE 127 (H) 10/05/2022   BUN 10 10/05/2022   CREATININE 0.78 10/05/2022   BILITOT 0.1 (L) 07/28/2022   ALKPHOS 121 07/28/2022   AST 22 07/28/2022   ALT 15 07/28/2022   PROT 7.2 07/28/2022   ALBUMIN 3.7 07/28/2022   CALCIUM 7.9 (L) 10/05/2022   GFRAA >60 10/31/2018    Speciality Comments: No specialty comments available.  Procedures:  No procedures performed Allergies: Patient has no known allergies.   Assessment / Plan:     Visit Diagnoses: Rheumatoid factor positive - 07/28/22: RF 124.9, ANA negative, CRP 2.3, ESR 30, Vitamin D 42.08  Polyarthralgia  Status post total replacement of left hip  Status post total replacement of right hip  Status post total right knee replacement  History of iron deficiency anemia  Orders: No orders of the defined types were placed in this encounter.  No orders of the defined types were placed in this encounter.   Face-to-face time spent with patient was *** minutes. Greater than 50% of time was spent in counseling and coordination of care.  Follow-Up Instructions: No follow-ups on file.   Ofilia Neas, PA-C  Note - This record has been created using Dragon software.  Chart creation errors have been sought, but may not always  have been located. Such creation errors do not reflect on  the standard of medical care.

## 2022-11-09 ENCOUNTER — Other Ambulatory Visit: Payer: Self-pay | Admitting: Orthopaedic Surgery

## 2022-11-09 ENCOUNTER — Encounter: Payer: Self-pay | Admitting: Physical Medicine & Rehabilitation

## 2022-11-10 ENCOUNTER — Encounter: Payer: 59 | Admitting: Physician Assistant

## 2022-11-10 MED ORDER — TIZANIDINE HCL 4 MG PO TABS: 4.0000 mg | ORAL_TABLET | Freq: Four times a day (QID) | ORAL | 0 refills | Status: DC | PRN

## 2022-11-11 ENCOUNTER — Other Ambulatory Visit: Payer: Self-pay | Admitting: Surgical

## 2022-11-11 MED ORDER — OXYCODONE HCL 5 MG PO TABS: 5.0000 mg | ORAL_TABLET | ORAL | 0 refills | Status: DC | PRN

## 2022-11-15 ENCOUNTER — Encounter: Payer: Self-pay | Admitting: Hematology

## 2022-11-16 ENCOUNTER — Encounter: Payer: Self-pay | Admitting: Orthopaedic Surgery

## 2022-11-16 ENCOUNTER — Ambulatory Visit (INDEPENDENT_AMBULATORY_CARE_PROVIDER_SITE_OTHER): Payer: Medicare Other | Admitting: Orthopaedic Surgery

## 2022-11-16 DIAGNOSIS — Z96641 Presence of right artificial hip joint: Secondary | ICD-10-CM

## 2022-11-16 DIAGNOSIS — Z96651 Presence of right artificial knee joint: Secondary | ICD-10-CM

## 2022-11-16 NOTE — Progress Notes (Signed)
The patient is in continue follow-up as a relates to her right hip replacement and her right knee replacement.  We replaced her left hip remotely.  The right knee replaced in June of last year and the right hip in February of this year.  She is making progress.  She does see rheumatology next week for multiple musculoskeletal complaints which have all been appropriate.  She is making progress with her mobility and her posture and weight loss.  Her right and left hips move smoothly.  She has excellent range of motion of her right knee as well which I am proud of how she is pursue the pain with this.  She does understand that I am still going to provide tramadol on occasion and muscle relaxant while she continues to recover send patient is doing so well.  From my standpoint we will see her back in 3 months.  At that visit we will have a standing low AP pelvis as well as a standing AP and lateral of her right knee.

## 2022-11-17 ENCOUNTER — Other Ambulatory Visit: Payer: Self-pay | Admitting: Orthopaedic Surgery

## 2022-11-17 MED ORDER — SULFAMETHOXAZOLE-TRIMETHOPRIM 800-160 MG PO TABS: 1.0000 | ORAL_TABLET | Freq: Two times a day (BID) | ORAL | 0 refills | Status: DC

## 2022-11-17 MED ORDER — OXYCODONE HCL 5 MG PO TABS: 5.0000 mg | ORAL_TABLET | Freq: Four times a day (QID) | ORAL | 0 refills | Status: DC | PRN

## 2022-11-17 MED ORDER — OXYCODONE HCL 5 MG PO TABS: 5.0000 mg | ORAL_TABLET | ORAL | 0 refills | Status: DC | PRN

## 2022-11-17 MED ORDER — SULFAMETHOXAZOLE-TRIMETHOPRIM 800-160 MG PO TABS: 1.0000 | ORAL_TABLET | Freq: Two times a day (BID) | ORAL | 0 refills | Status: AC

## 2022-11-17 MED ORDER — TIZANIDINE HCL 4 MG PO TABS: 4.0000 mg | ORAL_TABLET | Freq: Four times a day (QID) | ORAL | 0 refills | Status: DC | PRN

## 2022-11-17 NOTE — Addendum Note (Signed)
Addended by: Jean Rosenthal on: 11/17/2022 12:10 PM   Modules accepted: Orders

## 2022-11-17 NOTE — Telephone Encounter (Signed)
From: Leanna Battles To: Office of [place], [place] Sent: 11/17/2022 11:14 AM EDT Subject: Medication Renewal Request  Refills have been requested for the following medications:   oxyCODONE (OXY IR/ROXICODONE) 5 MG immediate release tablet [Kaylee Salas]  Patient Comment: Needed for R knee & R hip replacement for daily pt  Preferred pharmacy: [id number] [address], [place] Delivery method: Arlyss Gandy

## 2022-11-17 NOTE — Telephone Encounter (Signed)
From: Leanna Battles To: Office of [place], [place] Sent: 11/17/2022 11:16 AM EDT Subject: Medication Renewal Request  Refills have been requested for the following medications:   tiZANidine (ZANAFLEX) 4 MG tablet [GILBERT CLARK]  Patient Comment: Needed for muscle spasms and drawing from daily pt  Preferred pharmacy: [id number] [address], [place] Delivery method: Arlyss Gandy

## 2022-11-21 ENCOUNTER — Encounter: Payer: Self-pay | Admitting: Hematology

## 2022-11-22 ENCOUNTER — Encounter: Payer: Self-pay | Admitting: Physician Assistant

## 2022-11-22 ENCOUNTER — Ambulatory Visit (INDEPENDENT_AMBULATORY_CARE_PROVIDER_SITE_OTHER): Payer: Medicare Other

## 2022-11-22 ENCOUNTER — Ambulatory Visit: Payer: Medicare Other | Attending: Physician Assistant | Admitting: Physician Assistant

## 2022-11-22 ENCOUNTER — Ambulatory Visit: Payer: Medicare Other

## 2022-11-22 VITALS — BP 112/78 | HR 88 | Resp 16 | Ht 62.0 in | Wt 182.0 lb

## 2022-11-22 DIAGNOSIS — M7918 Myalgia, other site: Secondary | ICD-10-CM | POA: Diagnosis not present

## 2022-11-22 DIAGNOSIS — M79642 Pain in left hand: Secondary | ICD-10-CM

## 2022-11-22 DIAGNOSIS — G8929 Other chronic pain: Secondary | ICD-10-CM

## 2022-11-22 DIAGNOSIS — M542 Cervicalgia: Secondary | ICD-10-CM

## 2022-11-22 DIAGNOSIS — M25511 Pain in right shoulder: Secondary | ICD-10-CM | POA: Diagnosis not present

## 2022-11-22 DIAGNOSIS — Z862 Personal history of diseases of the blood and blood-forming organs and certain disorders involving the immune mechanism: Secondary | ICD-10-CM | POA: Diagnosis not present

## 2022-11-22 DIAGNOSIS — M79641 Pain in right hand: Secondary | ICD-10-CM | POA: Insufficient documentation

## 2022-11-22 DIAGNOSIS — Z96651 Presence of right artificial knee joint: Secondary | ICD-10-CM | POA: Diagnosis not present

## 2022-11-22 DIAGNOSIS — M79672 Pain in left foot: Secondary | ICD-10-CM | POA: Diagnosis not present

## 2022-11-22 DIAGNOSIS — M2550 Pain in unspecified joint: Secondary | ICD-10-CM | POA: Insufficient documentation

## 2022-11-22 DIAGNOSIS — Z96641 Presence of right artificial hip joint: Secondary | ICD-10-CM | POA: Insufficient documentation

## 2022-11-22 DIAGNOSIS — M25512 Pain in left shoulder: Secondary | ICD-10-CM

## 2022-11-22 DIAGNOSIS — Z96642 Presence of left artificial hip joint: Secondary | ICD-10-CM | POA: Insufficient documentation

## 2022-11-22 DIAGNOSIS — M79671 Pain in right foot: Secondary | ICD-10-CM | POA: Insufficient documentation

## 2022-11-22 DIAGNOSIS — R768 Other specified abnormal immunological findings in serum: Secondary | ICD-10-CM | POA: Insufficient documentation

## 2022-11-22 DIAGNOSIS — Z5181 Encounter for therapeutic drug level monitoring: Secondary | ICD-10-CM | POA: Diagnosis not present

## 2022-11-23 ENCOUNTER — Other Ambulatory Visit: Payer: Self-pay | Admitting: Orthopaedic Surgery

## 2022-11-23 MED ORDER — OXYCODONE HCL 5 MG PO TABS: 5.0000 mg | ORAL_TABLET | Freq: Four times a day (QID) | ORAL | 0 refills | Status: DC | PRN

## 2022-11-23 NOTE — Progress Notes (Signed)
RF is positive--higher titer.  ESR and CRP remain elevated and have trended up-indicative of inflammation.  CBC and CMP WNL.  Uric acid is WNL.  Labs will be discussed in detail at her NPFU.   Please see if hepatitis panel, SPEP, Immunoglobulins, TB gold, and HIV can be added.  If not, we can obtain these labs at the follow up visit.

## 2022-11-24 ENCOUNTER — Telehealth: Payer: Self-pay | Admitting: *Deleted

## 2022-11-24 MED ORDER — TIZANIDINE HCL 4 MG PO TABS: 4.0000 mg | ORAL_TABLET | Freq: Four times a day (QID) | ORAL | 0 refills | Status: DC | PRN

## 2022-11-24 NOTE — Telephone Encounter (Signed)
I would recommend evaluation by Dr. Ninfa Linden if her incision is swollen.    Her WBC count was WNL.

## 2022-11-24 NOTE — Telephone Encounter (Signed)
Patient contacted the office and left message stating that she has seen her labs coming across in my chart. Patient states her incision is swollen from a hip replacement she had about 5-6 weeks ago. She would like to know if this would effect her rheumatoid factor results.

## 2022-11-24 NOTE — Progress Notes (Signed)
Hepatic function panel was added instead of hepatitis panel as requested---can this be switched?

## 2022-11-24 NOTE — Telephone Encounter (Signed)
Patient advised Lovena Le would recommend evaluation by Dr. Ninfa Linden if her incision is swollen.    Her WBC count was WNL.  Patient expressed understanding.

## 2022-11-24 NOTE — Telephone Encounter (Signed)
Ortho bundle 14 day in office visit completed on 10/17/22.

## 2022-11-28 ENCOUNTER — Encounter: Payer: Self-pay | Admitting: Hematology

## 2022-12-01 ENCOUNTER — Other Ambulatory Visit: Payer: Self-pay | Admitting: Orthopaedic Surgery

## 2022-12-01 LAB — CBC WITH DIFFERENTIAL/PLATELET
Absolute Monocytes: 585 cells/uL (ref 200–950)
Basophils Absolute: 27 cells/uL (ref 0–200)
Basophils Relative: 0.4 %
Eosinophils Absolute: 204 cells/uL (ref 15–500)
Eosinophils Relative: 3 %
HCT: 39.5 % (ref 35.0–45.0)
Hemoglobin: 12.9 g/dL (ref 11.7–15.5)
Lymphs Abs: 1741 cells/uL (ref 850–3900)
MCH: 29.4 pg (ref 27.0–33.0)
MCHC: 32.7 g/dL (ref 32.0–36.0)
MCV: 90 fL (ref 80.0–100.0)
MPV: 11.8 fL (ref 7.5–12.5)
Monocytes Relative: 8.6 %
Neutro Abs: 4243 cells/uL (ref 1500–7800)
Neutrophils Relative %: 62.4 %
Platelets: 355 10*3/uL (ref 140–400)
RBC: 4.39 10*6/uL (ref 3.80–5.10)
RDW: 14 % (ref 11.0–15.0)
Total Lymphocyte: 25.6 %
WBC: 6.8 10*3/uL (ref 3.8–10.8)

## 2022-12-01 LAB — TEST AUTHORIZATION 2

## 2022-12-01 LAB — HEPATIC FUNCTION PANEL
AG Ratio: 1.5 (calc) (ref 1.0–2.5)
ALT: 11 U/L (ref 6–29)
AST: 16 U/L (ref 10–35)
Albumin: 4.3 g/dL (ref 3.6–5.1)
Alkaline phosphatase (APISO): 130 U/L (ref 37–153)
Bilirubin, Direct: 0 mg/dL (ref 0.0–0.2)
Globulin: 2.9 g/dL (calc) (ref 1.9–3.7)
Indirect Bilirubin: 0.2 mg/dL (calc) (ref 0.2–1.2)
Total Bilirubin: 0.2 mg/dL (ref 0.2–1.2)
Total Protein: 7.2 g/dL (ref 6.1–8.1)

## 2022-12-01 LAB — COMPLETE METABOLIC PANEL WITH GFR
AG Ratio: 1.5 (calc) (ref 1.0–2.5)
ALT: 11 U/L (ref 6–29)
AST: 16 U/L (ref 10–35)
Albumin: 4.3 g/dL (ref 3.6–5.1)
Alkaline phosphatase (APISO): 130 U/L (ref 37–153)
BUN: 22 mg/dL (ref 7–25)
CO2: 25 mmol/L (ref 20–32)
Calcium: 9.1 mg/dL (ref 8.6–10.4)
Chloride: 105 mmol/L (ref 98–110)
Creat: 0.93 mg/dL (ref 0.50–1.03)
Globulin: 2.9 g/dL (calc) (ref 1.9–3.7)
Glucose, Bld: 77 mg/dL (ref 65–99)
Potassium: 4.6 mmol/L (ref 3.5–5.3)
Sodium: 140 mmol/L (ref 135–146)
Total Bilirubin: 0.2 mg/dL (ref 0.2–1.2)
Total Protein: 7.2 g/dL (ref 6.1–8.1)
eGFR: 72 mL/min/{1.73_m2} (ref >=60)

## 2022-12-01 LAB — HEPATITIS C ANTIBODY: Hepatitis C Ab: NONREACTIVE

## 2022-12-01 LAB — PROTEIN ELECTROPHORESIS, SERUM, WITH REFLEX
Albumin ELP: 4.2 g/dL (ref 3.8–4.8)
Alpha 1: 0.3 g/dL (ref 0.2–0.3)
Alpha 2: 0.9 g/dL (ref 0.5–0.9)
Beta 2: 0.3 g/dL (ref 0.2–0.5)
Beta Globulin: 0.5 g/dL (ref 0.4–0.6)
Gamma Globulin: 1 g/dL (ref 0.8–1.7)
Total Protein: 7.2 g/dL (ref 6.1–8.1)

## 2022-12-01 LAB — TEST AUTHORIZATION

## 2022-12-01 LAB — ANTI-SMITH ANTIBODY: ENA SM Ab Ser-aCnc: <1 AI

## 2022-12-01 LAB — RHEUMATOID FACTOR: Rheumatoid fact SerPl-aCnc: >1000 IU/mL — ABNORMAL HIGH (ref <14)

## 2022-12-01 LAB — C-REACTIVE PROTEIN: CRP: 22.5 mg/L — ABNORMAL HIGH (ref <8.0)

## 2022-12-01 LAB — GLUCOSE 6 PHOSPHATE DEHYDROGENASE: G-6PDH: 15.2 U/g Hgb (ref 7.0–20.5)

## 2022-12-01 LAB — SJOGRENS SYNDROME-A EXTRACTABLE NUCLEAR ANTIBODY: SSA (Ro) (ENA) Antibody, IgG: <1 AI

## 2022-12-01 LAB — SEDIMENTATION RATE: Sed Rate: 33 mm/h — ABNORMAL HIGH (ref 0–30)

## 2022-12-01 LAB — IMMUNOFIXATION ELECTROPHORESIS
IgG (Immunoglobin G), Serum: 928 mg/dL (ref 600–1640)
IgM, Serum: 334 mg/dL — ABNORMAL HIGH (ref 50–300)
Immunoglobulin A: 106 mg/dL (ref 47–310)

## 2022-12-01 LAB — ANTI-SCLERODERMA ANTIBODY: Scleroderma (Scl-70) (ENA) Antibody, IgG: <1 AI

## 2022-12-01 LAB — SJOGRENS SYNDROME-B EXTRACTABLE NUCLEAR ANTIBODY: SSB (La) (ENA) Antibody, IgG: <1 AI

## 2022-12-01 LAB — HEPATITIS B SURFACE ANTIGEN: Hepatitis B Surface Ag: NONREACTIVE

## 2022-12-01 LAB — C3 AND C4
C3 Complement: 132 mg/dL (ref 83–193)
C4 Complement: 35 mg/dL (ref 15–57)

## 2022-12-01 LAB — CYCLIC CITRUL PEPTIDE ANTIBODY, IGG: Cyclic Citrullin Peptide Ab: <16 UNITS

## 2022-12-01 LAB — ANTI-DNA ANTIBODY, DOUBLE-STRANDED: ds DNA Ab: 1 IU/mL

## 2022-12-01 LAB — URIC ACID: Uric Acid, Serum: 4.4 mg/dL (ref 2.5–7.0)

## 2022-12-01 LAB — ANA: Anti Nuclear Antibody (ANA): NEGATIVE

## 2022-12-01 LAB — HEPATITIS B SURFACE ANTIBODY,QUALITATIVE: Hep B S Ab: REACTIVE — AB

## 2022-12-01 LAB — RNP ANTIBODY: Ribonucleic Protein(ENA) Antibody, IgG: <1 AI

## 2022-12-01 MED ORDER — HYDROCODONE-ACETAMINOPHEN 5-325 MG PO TABS: 1.0000 | ORAL_TABLET | Freq: Four times a day (QID) | ORAL | 0 refills | Status: DC | PRN

## 2022-12-01 MED ORDER — TIZANIDINE HCL 4 MG PO TABS: 4.0000 mg | ORAL_TABLET | Freq: Four times a day (QID) | ORAL | 0 refills | Status: DC | PRN

## 2022-12-01 NOTE — Telephone Encounter (Signed)
Will prescribing norco instead at this point

## 2022-12-02 ENCOUNTER — Encounter: Payer: Self-pay | Admitting: Physical Medicine & Rehabilitation

## 2022-12-02 ENCOUNTER — Encounter: Payer: Medicare Other | Attending: Physical Medicine & Rehabilitation | Admitting: Physical Medicine & Rehabilitation

## 2022-12-02 DIAGNOSIS — M533 Sacrococcygeal disorders, not elsewhere classified: Secondary | ICD-10-CM

## 2022-12-02 DIAGNOSIS — G8929 Other chronic pain: Secondary | ICD-10-CM | POA: Insufficient documentation

## 2022-12-02 MED ORDER — LIDOCAINE HCL (PF) 2 % IJ SOLN
5.0000 mL | Freq: Once | INTRAMUSCULAR | Status: AC
Start: 2022-12-02 — End: 2022-12-02
  Administered 2022-12-02: 5 mL

## 2022-12-02 MED ORDER — LIDOCAINE HCL 1 % IJ SOLN
10.0000 mL | Freq: Once | INTRAMUSCULAR | Status: AC
Start: 2022-12-02 — End: 2022-12-02
  Administered 2022-12-02: 10 mL

## 2022-12-02 NOTE — Progress Notes (Signed)
  PROCEDURE RECORD [facility]   Name: [name] DOB:1966/02/02 MRN: [id number]  Date:12/02/2022  Physician: Claudette Laws, MD    Nurse/CMA: Nedra Hai, CMA  Allergies: No Known Allergies  Consent Signed: Yes.    Is patient diabetic? No.  CBG today? .  Pregnant: No. LMP: No LMP recorded. Patient is postmenopausal. (age 57-55)  Anticoagulants: no Anti-inflammatory: no Antibiotics: no  Procedure: Right Sacroiliac Radiofrequency  Position: Prone Start Time: 1:38 pm  End Time: 1:58 pm  Fluoro Time: 28  RN/CMA Gwendolen Hewlett, CMA Snipes, CMA    Time 1:21 pm 2:01 pm    BP 114/76 127/78    Pulse 87 88    Respirations 16 16    O2 Sat 95 94    S/S 6 6    Pain Level 4/10 0/10     D/C home with friend, [name] & O X 3, D/C instructions reviewed, and sits independently.

## 2022-12-02 NOTE — Progress Notes (Signed)
RIght Sacroiliac radio frequency Neurotomy  under fluoroscopic guidance This consists of  L5 dorsal ramus , S1,2,3 lateral branch radiofrequency Neurotomy    Indication is sacroiliac pain which has improved temporarily  by at least 50% after Intraarticular sacroiliac and/or L5 DR, S1.2.3 lateral branch blocks under fluoroscopic guidance Pain interferes with self-care and mobility and has failed to respond to conservative measures.  Informed consent was obtained after discussing risks and benefits of the procedure with the patient these include bleeding bruising and infection temporary or permanent paralysis. The patient elects to proceed and has given written consent.  Patient placed prone on fluoroscopy table. Area marked and prepped with Betadine. Fluoroscopic images utilized to guide needle. 25-gauge 1.5 inch needle was used to anesthetize 5 injection points with 2 cc of 1% lidocaine each. Then a 18-gauge 10 cm RF needle with a 10 mm curved active tip was inserted under fluoroscopic guidance first targeting the S1 SA P./sacral ala junction, bone contact made and confirmed with lateral imaging.  motor stimulation at 2 Hz confirm proper needle location followed by injection of 1ml of 2% lidocaine MPF. imaging.  Motor stimulation at 2 Hz confirmed proper needle location followed by injection of one ML of the2% lidocaine solution. Radio frequency 80C for 90 seconds was performed. Then the infero- lateral aspect of the S1, S2 and lateral aspect of S3 sacral foramina were targeted. Bone contact made.  Motor stim at 2 Hz confirmed proper needle location. One ML of the 2% lidocaine solution was injected into each of 3 sites and radio frequency ablation 80C for 90 seconds was performed. Patient tolerated procedure well. Post procedure instructions given  

## 2022-12-02 NOTE — Progress Notes (Addendum)
Office Visit Note  Patient: Kaylee Salas             Date of Birth: Aug 16, 1966           MRN: [id number]             PCP: Lorelei Pont, DO Referring: Lorelei Pont, DO Visit Date: 12/14/2022 Occupation: @GUAROCC @  Subjective:  Pain in multiple joints   History of Present Illness: Kaylee Salas is a 57 y.o. female with history of rheumatoid arthritis and osteoarthritis.  Patient presents today to discuss x-ray results and lab results from her initial office visit.  She continues to have pain involving multiple joints.  Her pain has been severe to the point that it is significantly interfering with her quality of life.  She is also been experiencing profound fatigue on a daily basis.  She continues to follow-up with pain management for injections in her lower back.  She is also under the care of Dr. Magnus Ivan after having her right hip replaced.   Activities of Daily Living:  Patient reports morning stiffness for all day.  Patient Reports nocturnal pain.  Difficulty dressing/grooming: Reports Difficulty climbing stairs: Reports Difficulty getting out of chair: Reports Difficulty using hands for taps, buttons, cutlery, and/or writing: Reports  Review of Systems  Constitutional:  Positive for fatigue.  HENT:  Positive for mouth dryness. Negative for mouth sores.   Eyes:  Positive for dryness.  Respiratory:  Positive for shortness of breath.   Cardiovascular:  Negative for chest pain and palpitations.  Gastrointestinal:  Positive for constipation. Negative for blood in stool and diarrhea.  Endocrine: Negative for increased urination.  Genitourinary:  Positive for involuntary urination.  Musculoskeletal:  Positive for joint pain, gait problem, joint pain, joint swelling, myalgias, muscle weakness, morning stiffness, muscle tenderness and myalgias.  Skin:  Negative for color change, rash, hair loss and sensitivity to sunlight.  Allergic/Immunologic: Negative for susceptible to  infections.  Neurological:  Negative for dizziness and headaches.  Hematological:  Negative for swollen glands.  Psychiatric/Behavioral:  Positive for depressed mood and sleep disturbance. The patient is nervous/anxious.     PMFS History:  Patient Active Problem List   Diagnosis Date Noted   Status post total replacement of right hip 10/04/2022   Iron deficiency anemia 07/18/2022   OA (osteoarthritis) of knee 02/08/2022   Status post total right knee replacement 02/08/2022   Abnormal thyroid blood test 10/15/2021   Unilateral primary osteoarthritis, right hip 03/24/2021   Status post total replacement of left hip 10/30/2018    Past Medical History:  Diagnosis Date   Anemia    Anxiety    Arthritis    Complication of anesthesia    "woke up panicking/fighting"   Depression    Osteoporosis    PONV (postoperative nausea and vomiting)    Sacroiliac joint disease    Unilateral primary osteoarthritis, left hip 10/30/2018    Family History  Problem Relation Age of Onset   Hypertension Mother    Hyperlipidemia Mother    Heart attack Mother    Heart attack Father    Hyperlipidemia Father    Hypertension Father    Heart failure Father    Past Surgical History:  Procedure Laterality Date   CHOLECYSTECTOMY  2004   GASTRIC BYPASS     HAND SURGERY Right    ring finger reconstruction   HERNIA REPAIR     KNEE ARTHROSCOPY W/ ACL RECONSTRUCTION Right 1997   TOTAL HIP ARTHROPLASTY Left  10/30/2018   Procedure: LEFT TOTAL HIP ARTHROPLASTY ANTERIOR APPROACH;  Surgeon: Kathryne Hitch, MD;  Location: [facility] OR;  Service: Orthopedics;  Laterality: Left;   TOTAL HIP ARTHROPLASTY Right 10/04/2022   Procedure: RIGHT TOTAL HIP ARTHROPLASTY ANTERIOR APPROACH;  Surgeon: Kathryne Hitch, MD;  Location: [facility] OR;  Service: Orthopedics;  Laterality: Right;   TOTAL KNEE ARTHROPLASTY Right 02/08/2022   Procedure: RIGHT TOTAL KNEE ARTHROPLASTY;  Surgeon: Kathryne Hitch, MD;  Location: [facility]  OR;  Service: Orthopedics;  Laterality: Right;   UMBILICAL HERNIA REPAIR  2004   Social History   Social History Narrative   Not on file    There is no immunization history on file for this patient.   Objective: Vital Signs: BP 128/83 (BP Location: Left Arm, Patient Position: Sitting, Cuff Size: Large)   Pulse 90   Resp 16   Ht  (1.575 m)   Wt 179 lb 12.8 oz (81.6 kg)   BMI 32.89 kg/m    Physical Exam Vitals and nursing note reviewed.  Constitutional:      Appearance: She is well-developed.  HENT:     Head: Normocephalic and atraumatic.  Eyes:     Conjunctiva/sclera: Conjunctivae normal.  Cardiovascular:     Rate and Rhythm: Normal rate and regular rhythm.     Heart sounds: Normal heart sounds.  Pulmonary:     Effort: Pulmonary effort is normal.     Breath sounds: Normal breath sounds.  Abdominal:     General: Bowel sounds are normal.     Palpations: Abdomen is soft.  Musculoskeletal:     Cervical back: Normal range of motion.  Lymphadenopathy:     Cervical: No cervical adenopathy.  Skin:    General: Skin is warm and dry.     Capillary Refill: Capillary refill takes less than 2 seconds.  Neurological:     Mental Status: She is alert and oriented to person, place, and time.  Psychiatric:        Behavior: Behavior normal.      Musculoskeletal Exam: Generalized hyperalgesia consistent with myofascial pain.  C-spine limited ROM.  Painful ROM of lumbar spine.  Shoulder joints have good ROM.  Elbow joints have good range of motion with no elbow joint tenderness.  Tenderness over the right wrist joint.  Tenderness over several MCP and PIP joints.  Complete fist formation bilaterally.  Hip replacements have good range of motion.right knee replacement has good range of motion with some warmth but no effusion.  Left knee joint has good range of motion with no warmth or effusion.  Ankle joints have good range of motion with no tenderness or joint swelling.  CDAI  Exam: CDAI Score: 7.2  Patient Global: 8 mm; Provider Global: 4 mm Swollen: 0 ; Tender: 8  Joint Exam 12/14/2022      Right  Left  Wrist   Tender     MCP 2   Tender     MCP 3   Tender     MCP 4   Tender     PIP 2   Tender     PIP 3   Tender     Ankle   Tender   Tender     Investigation: No additional findings.  Imaging: XR Foot 2 Views Left  Result Date: 11/24/2022 First MTP narrowing, PIP and DIP narrowing was noted.  No intertarsal, tibiotalar or subtalar joint space narrowing was noted.  Inferior calcaneal spur was noted.  No erosive  changes were noted. Impression: The x-ray findings were suggestive of osteoarthritis of the foot.  XR Foot 2 Views Right  Result Date: 11/24/2022 First MTP, PIP and DIP narrowing was noted.  Subluxation of third MTP joint was noted.  No intertarsal, tibiotalar or subtalar joint space narrowing was noted.  Inferior calcaneal spur was noted.  No erosive changes were noted. Impression: These findings are suggestive of osteoarthritis of the foot.  XR Shoulder Left  Result Date: 11/24/2022 No glenohumeral or acromioclavicular joint space narrowing was noted.  No chondrocalcinosis was noted. Impression: Unremarkable x-rays of the shoulder.  XR Shoulder Right  Result Date: 11/24/2022 No glenohumeral or acromioclavicular joint space narrowing was noted.  No chondrocalcinosis was noted. Impression: Unremarkable x-rays of the shoulder.  XR Hand 2 View Left  Result Date: 11/24/2022 [facility], PIP and DIP narrowing was noted.  No MCP, intercarpal or radiocarpal joint space narrowing was noted.  No erosive changes were noted. Impression: These findings are suggestive of osteoarthritis of the hand.  XR Hand 2 View Right  Result Date: 11/24/2022 [facility], PIP and DIP narrowing was noted.  No MCP, intercarpal or radiocarpal joint space narrowing was noted.  No erosive changes were noted. Impression: These findings are suggestive of osteoarthritis of the hand.  XR  Cervical Spine 2 or 3 views  Result Date: 11/24/2022 No significant disc space narrowing was noted.  Posterior spurring was noted.  Mild facet joint narrowing was noted. Impression: Early degenerative changes were noted in the cervical spine.   Recent Labs: Lab Results  Component Value Date   WBC 6.8 11/22/2022   HGB 12.9 11/22/2022   PLT 355 11/22/2022   NA 140 11/22/2022   K 4.6 11/22/2022   CL 105 11/22/2022   CO2 25 11/22/2022   GLUCOSE 77 11/22/2022   BUN 22 11/22/2022   CREATININE 0.93 11/22/2022   BILITOT 0.2 11/22/2022   BILITOT 0.2 11/22/2022   ALKPHOS 121 07/28/2022   AST 16 11/22/2022   AST 16 11/22/2022   ALT 11 11/22/2022   ALT 11 11/22/2022   PROT 7.2 11/22/2022   PROT 7.2 11/22/2022   PROT 7.2 11/22/2022   ALBUMIN 3.7 07/28/2022   CALCIUM 9.1 11/22/2022   GFRAA >60 10/31/2018    Speciality Comments: No specialty comments available.  Procedures:  No procedures performed Allergies: Patient has no known allergies.   Assessment / Plan:     Visit Diagnoses: Rheumatoid arthritis involving multiple sites with positive rheumatoid factor - 07/28/22: RF 124.9, ANA negative, CRP 2.3, ESR 30, Vitamin D 42.08, family history of rheumatoid arthritis: Maternal first cousin:   Patient presented today to discuss lab results and x-ray results from her initial office visit.  X-rays of both hands and feet were consistent with osteoarthritic changes.  No erosive changes were noted.  X-rays of both shoulders were unremarkable.  Her rheumatoid factor was greater than 1000 and and CRP and sed rate were elevated.  Anti-CCP negative.  These results were discussed today in detail.  She continues to have persistent pain and intermittent formation involving multiple joints.  Her pain level has been significantly interfering with her quality of life.  She is also been having profound fatigue.  Discussed initiating a trial of Plaquenil.  Indications, contraindications, and potential side  effects of Plaquenil were discussed today in detail.  All questions were addressed and consent was obtained.  A prescription for Plaquenil 200 mg 1 tablet by mouth daily was sent to the pharmacy.  She was  advised to notify us if she cannot tolerate taking Plaquenil.  She will follow-up in the office in 8 weeks or sooner if needed to assess her response. - Plan: Sedimentation rate, C-reactive protein  Patient was counseled on the purpose, proper use, and adverse effects of hydroxychloroquine including nausea/diarrhea, skin rash, headaches, and sun sensitivity.  Advised patient to wear sunscreen once starting hydroxychloroquine to reduce risk of rash associated with sun sensitivity.  Discussed importance of annual eye exams while on hydroxychloroquine to monitor to ocular toxicity and discussed importance of frequent laboratory monitoring.  Provided patient with eye exam form for baseline ophthalmologic exam.  Reviewed risk for QTC prolongation when used in combination with other QTc prolonging agents (including but not limited to antiarrhythmics, macrolide antibiotics, flouroquinolones, tricyclic antidepressants, citalopram, specific antipsychotics, ondansetron, migraine triptans, and methadone). Provided patient with educational materials on hydroxychloroquine and answered all questions.  Patient consented to hydroxychloroquine. Will upload consent in the media tab.    Dose will be Plaquenil 200 mg once daily.  Prescription pending lab results.  High risk medication use -Plan to initiate a trial of Plaquenil.  A prescription for Plaquenil 200 mg 1 tablet by mouth once daily was sent to the pharmacy today.  She will return for CBC and CMP in 1 month, 3 months, then every 5 months.  Standing orders for CBC and CMP were placed today.  11/22/22: SPEP did not reveal any abnormal protein bands, hepatitis B surface antigen negative, hepatitis C antibody negative.  The following lab work will be obtained today for  further evaluation in anticipation of requiring immunosuppressive therapy in the future.  Plan: QuantiFERON-TB Gold Plus, Hepatitis B DNA, ultraquantitative, PCR, HIV Antibody (routine testing w rflx), CBC with Differential/Platelet, COMPLETE METABOLIC PANEL WITH GFR, CBC with Differential/Platelet, CMP14+EGFR  Hepatitis B antibody positive - Hepatitis B surface antibody was positive likely due to previous hepatitis B vaccine.  Hepatitis B surface antigen was negative.  Plan to check hepatitis B DNA quant for reassurance.  Plan: Hepatitis B DNA, ultraquantitative, PCR  Encounter for screening for HIV -Order for HIV released today.  Plan: HIV Antibody (routine testing w rflx)  Screening for tuberculosis - Order for TB gold released today. Plan: QuantiFERON-TB Gold Plus  Elevated sed rate -CRP 22.5 and ESR 33 on 11/22/22.  Future orders placed today to be drawn at her next appointment. plan: Sedimentation rate, C-reactive protein  Primary osteoarthritis of both hands - XR of both hands consistent with OA on 11/22/22.  X-ray findings were discussed with the patient today in detail.  No erosive changes were noted.  Given elevated CRP, sed rate, and rheumatoid factor greater than the thousand along with chronic arthralgias and intermittent inflammation we plan to initiate a trial of Plaquenil.  Chronic pain of both shoulders: X-rays of both shoulders were unremarkable on 11/22/2022.  She has good range of motion of both shoulder joints on examination today with some discomfort bilaterally.  Plan to initiate Plaquenil as discussed above.  Primary osteoarthritis of both feet - Evaluated by Dr. Lajoyce Cornersuda in the past. XR consistent with OA on 11/22/22.  X-rays of both feet were reviewed with the patient today in the office.  X-ray findings were consistent with osteoarthritis.  No erosive changes were noted.  She continues to have chronic pain in both feet.  She has tenderness over both ankle joints on examination  today.  Myofascial pain: She has generalized hyperalgesia on examination consistent with myofascial pain.  Under the care of  pain management.  We will further discuss treatment options for management of myofascial pain in the future.  Status post total replacement of left hip - Performed by Dr. Magnus Ivan on 10/30/2018.  Doing well.  Status post total replacement of right hip - Performed by Dr. Magnus Ivan on 10/04/2022.  Doing well.  Status post total right knee replacement - Performed by Dr. Blackman-02/08/2022.  Doing well.  DDD (degenerative disc disease), cervical - Early degenerativ changes in cervical spine on 11/22/22--reviewed x-ray results with the patient today in the office.  History of iron deficiency anemia - Managed by Dr. Ellin Saba in the past.  Orders: Orders Placed This Encounter  Procedures   QuantiFERON-TB Gold Plus   Hepatitis B DNA, ultraquantitative, PCR   HIV Antibody (routine testing w rflx)   CBC with Differential/Platelet   COMPLETE METABOLIC PANEL WITH GFR   CBC with Differential/Platelet   CMP14+EGFR   Sedimentation rate   C-reactive protein   Meds ordered this encounter  Medications   hydroxychloroquine (PLAQUENIL) 200 MG tablet    Sig: Take 1 tablet (200 mg total) by mouth daily.    Dispense:  90 tablet    Refill:  0     Follow-Up Instructions: Return in about 8 weeks (around 02/08/2023) for Rheumatoid arthritis.   Gearldine Bienenstock, PA-C  Note - This record has been created using Dragon software.  Chart creation errors have been sought, but may not always  have been located. Such creation errors do not reflect on  the standard of medical care.

## 2022-12-02 NOTE — Patient Instructions (Signed)
Sacroiliac RF Right  This procedure has been performed to help reduce low back and buttocks pain as well as potentially hip pain. The duration of this injection is variable lasting from weeks to  Months. It may repeated if needed after 53mo

## 2022-12-07 ENCOUNTER — Encounter: Payer: Self-pay | Admitting: Hematology

## 2022-12-08 ENCOUNTER — Other Ambulatory Visit: Payer: Self-pay | Admitting: Orthopaedic Surgery

## 2022-12-08 MED ORDER — HYDROCODONE-ACETAMINOPHEN 5-325 MG PO TABS: 1.0000 | ORAL_TABLET | Freq: Four times a day (QID) | ORAL | 0 refills | Status: DC | PRN

## 2022-12-08 MED ORDER — TIZANIDINE HCL 4 MG PO TABS: 4.0000 mg | ORAL_TABLET | Freq: Four times a day (QID) | ORAL | 0 refills | Status: DC | PRN

## 2022-12-09 ENCOUNTER — Encounter: Payer: Self-pay | Admitting: Physical Medicine & Rehabilitation

## 2022-12-12 ENCOUNTER — Encounter: Payer: Self-pay | Admitting: Orthopaedic Surgery

## 2022-12-13 ENCOUNTER — Other Ambulatory Visit: Payer: Self-pay | Admitting: Orthopaedic Surgery

## 2022-12-14 ENCOUNTER — Encounter: Payer: Self-pay | Admitting: Physician Assistant

## 2022-12-14 ENCOUNTER — Ambulatory Visit: Payer: Medicare Other | Attending: Physician Assistant | Admitting: Physician Assistant

## 2022-12-14 VITALS — BP 128/83 | HR 90 | Resp 16 | Ht 62.0 in | Wt 179.8 lb

## 2022-12-14 DIAGNOSIS — M25512 Pain in left shoulder: Secondary | ICD-10-CM | POA: Insufficient documentation

## 2022-12-14 DIAGNOSIS — M5030 Other cervical disc degeneration, unspecified cervical region: Secondary | ICD-10-CM | POA: Diagnosis not present

## 2022-12-14 DIAGNOSIS — Z114 Encounter for screening for human immunodeficiency virus [HIV]: Secondary | ICD-10-CM

## 2022-12-14 DIAGNOSIS — Z96651 Presence of right artificial knee joint: Secondary | ICD-10-CM

## 2022-12-14 DIAGNOSIS — R700 Elevated erythrocyte sedimentation rate: Secondary | ICD-10-CM | POA: Diagnosis not present

## 2022-12-14 DIAGNOSIS — M19072 Primary osteoarthritis, left ankle and foot: Secondary | ICD-10-CM | POA: Insufficient documentation

## 2022-12-14 DIAGNOSIS — M19071 Primary osteoarthritis, right ankle and foot: Secondary | ICD-10-CM

## 2022-12-14 DIAGNOSIS — Z96641 Presence of right artificial hip joint: Secondary | ICD-10-CM | POA: Diagnosis not present

## 2022-12-14 DIAGNOSIS — M19041 Primary osteoarthritis, right hand: Secondary | ICD-10-CM

## 2022-12-14 DIAGNOSIS — R768 Other specified abnormal immunological findings in serum: Secondary | ICD-10-CM | POA: Diagnosis not present

## 2022-12-14 DIAGNOSIS — Z111 Encounter for screening for respiratory tuberculosis: Secondary | ICD-10-CM | POA: Diagnosis not present

## 2022-12-14 DIAGNOSIS — Z79899 Other long term (current) drug therapy: Secondary | ICD-10-CM

## 2022-12-14 DIAGNOSIS — M7918 Myalgia, other site: Secondary | ICD-10-CM | POA: Diagnosis not present

## 2022-12-14 DIAGNOSIS — Z862 Personal history of diseases of the blood and blood-forming organs and certain disorders involving the immune mechanism: Secondary | ICD-10-CM | POA: Diagnosis not present

## 2022-12-14 DIAGNOSIS — M25511 Pain in right shoulder: Secondary | ICD-10-CM | POA: Diagnosis not present

## 2022-12-14 DIAGNOSIS — Z96642 Presence of left artificial hip joint: Secondary | ICD-10-CM

## 2022-12-14 DIAGNOSIS — M19042 Primary osteoarthritis, left hand: Secondary | ICD-10-CM | POA: Insufficient documentation

## 2022-12-14 DIAGNOSIS — G8929 Other chronic pain: Secondary | ICD-10-CM

## 2022-12-14 DIAGNOSIS — Z5181 Encounter for therapeutic drug level monitoring: Secondary | ICD-10-CM

## 2022-12-14 DIAGNOSIS — R7689 Other specified abnormal immunological findings in serum: Secondary | ICD-10-CM

## 2022-12-14 DIAGNOSIS — M0579 Rheumatoid arthritis with rheumatoid factor of multiple sites without organ or systems involvement: Secondary | ICD-10-CM

## 2022-12-14 MED ORDER — HYDROCODONE-ACETAMINOPHEN 5-325 MG PO TABS: 1.0000 | ORAL_TABLET | Freq: Four times a day (QID) | ORAL | 0 refills | Status: DC | PRN

## 2022-12-14 MED ORDER — HYDROXYCHLOROQUINE SULFATE 200 MG PO TABS
200.0000 mg | ORAL_TABLET | Freq: Every day | ORAL | 0 refills | Status: DC
Start: 2022-12-14 — End: 2023-03-07

## 2022-12-14 NOTE — Patient Instructions (Addendum)
Standing Labs We placed an order today for your standing lab work.   Please have your standing labs drawn in 1 month, 3 months, and 5 months   Please have your labs drawn 2 weeks prior to your appointment so that the provider can discuss your lab results at your appointment, if possible.  Please note that you may see your imaging and lab results in MyChart before we have reviewed them. We will contact you once all results are reviewed. Please allow our office up to 72 hours to thoroughly review all of the results before contacting the office for clarification of your results.  WALK-IN LAB HOURS  Monday through Thursday from 8:00 am -12:30 pm and 1:00 pm-5:00 pm and Friday from 8:00 am-12:00 pm.  Patients with office visits requiring labs will be seen before walk-in labs.  You may encounter longer than normal wait times. Please allow additional time. Wait times may be shorter on  Monday and Thursday afternoons.  We do not book appointments for walk-in labs. We appreciate your patience and understanding with our staff.   Labs are drawn by [facility]. Please bring your co-pay at the time of your lab draw.  You may receive a bill from [facility] for your lab work.  Please note if you are on Hydroxychloroquine and and an order has been placed for a Hydroxychloroquine level,  you will need to have it drawn 4 hours or more after your last dose.  If you wish to have your labs drawn at another location, please call the office 24 hours in advance so we can fax the orders.  The office is located at [address], [address], [place], [place] [address]   If you have any questions regarding directions or hours of operation,  please call [phone].   As a reminder, please drink plenty of water prior to coming for your lab work. Thanks!    Hydroxychloroquine Tablets What is this medication? HYDROXYCHLOROQUINE (hye drox ee KLOR oh kwin) treats autoimmune conditions, such as rheumatoid arthritis and lupus.  It works by slowing down an overactive immune system. It may also be used to prevent and treat malaria. It works by killing the parasite that causes malaria. It belongs to a group of medications called DMARDs. This medicine may be used for other purposes; ask your health care provider or pharmacist if you have questions. COMMON BRAND NAME(S): Plaquenil, Quineprox What should I tell my care team before I take this medication? They need to know if you have any of these conditions: Diabetes Eye disease, vision problems Frequently drink alcohol G6PD deficiency Heart disease Irregular heartbeat or rhythm Kidney disease Liver disease Porphyria Psoriasis An unusual or allergic reaction to hydroxychloroquine, other medications, foods, dyes, or preservatives Pregnant or trying to get pregnant Breastfeeding How should I use this medication? Take this medication by mouth with water. Take it as directed on the prescription label. Do not cut, crush, or chew this medication. Swallow the tablets whole. Take it with food. Do not take it more than directed. Take all of this medication unless your care team tells you to stop it early. Keep taking it even if you think you are better. Take products with antacids in them at a different time of day than this medication. Take this medication 4 hours before or 4 hours after antacids. Talk to your care team if you have questions. Talk to your care team about the use of this medication in children. While this medication may be prescribed  for selected conditions, precautions do apply. Overdosage: If you think you have taken too much of this medicine contact a poison control center or emergency room at once. NOTE: This medicine is only for you. Do not share this medicine with others. What if I miss a dose? If you miss a dose, take it as soon as you can. If it is almost time for your next dose, take only that dose. Do not take double or extra doses. What may interact  with this medication? Do not take this medication with any of the following: Cisapride Dronedarone Pimozide Thioridazine This medication may also interact with the following: Ampicillin Antacids Cimetidine Cyclosporine Digoxin Kaolin Medications for diabetes, such as insulin, glipizide, glyburide Medications for seizures, such as carbamazepine, phenobarbital, phenytoin Mefloquine Methotrexate Other medications that cause heart rhythm changes Praziquantel This list may not describe all possible interactions. Give your health care provider a list of all the medicines, herbs, non-prescription drugs, or dietary supplements you use. Also tell them if you smoke, drink alcohol, or use illegal drugs. Some items may interact with your medicine. What should I watch for while using this medication? Visit your care team for regular checks on your progress. Tell your care team if your symptoms do not start to get better or if they get worse. You may need blood work done while you are taking this medication. If you take other medications that can affect heart rhythm, you may need more testing. Talk to your care team if you have questions. Your vision may be tested before and during use of this medication. Tell your care team right away if you have any change in your eyesight. This medication may cause serious skin reactions. They can happen weeks to months after starting the medication. Contact your care team right away if you notice fevers or flu-like symptoms with a rash. The rash may be red or purple and then turn into blisters or peeling of the skin. Or, you might notice a red rash with swelling of the face, lips or lymph nodes in your neck or under your arms. If you or your family notice any changes in your behavior, such as new or worsening depression, thoughts of harming yourself, anxiety, or other unusual or disturbing thoughts, or memory loss, call your care team right away. What side effects  may I notice from receiving this medication? Side effects that you should report to your care team as soon as possible: Allergic reactions--skin rash, itching, hives, swelling of the face, lips, tongue, or throat Aplastic anemia--unusual weakness or fatigue, dizziness, headache, trouble breathing, increased bleeding or bruising Change in vision Heart rhythm changes--fast or irregular heartbeat, dizziness, feeling faint or lightheaded, chest pain, trouble breathing Infection--fever, chills, cough, or sore throat Low blood sugar (hypoglycemia)--tremors or shaking, anxiety, sweating, cold or clammy skin, confusion, dizziness, rapid heartbeat Muscle injury--unusual weakness or fatigue, muscle pain, dark yellow or brown urine, decrease in amount of urine Pain, tingling, or numbness in the hands or feet Rash, fever, and swollen lymph nodes Redness, blistering, peeling, or loosening of the skin, including inside the mouth Thoughts of suicide or self-harm, worsening mood, or feelings of depression Unusual bruising or bleeding Side effects that usually do not require medical attention (report to your care team if they continue or are bothersome): Diarrhea Headache Nausea Stomach pain Vomiting This list may not describe all possible side effects. Call your doctor for medical advice about side effects. You may report side effects to FDA at 1-800-FDA-1088. Where  should I keep my medication? Keep out of the reach of children and pets. Store at room temperature up to 30 degrees C (86 degrees F). Protect from light. Get rid of any unused medication after the expiration date. To get rid of medications that are no longer needed or have expired: Take the medication to a medication take-back program. Check with your pharmacy or law enforcement to find a location. If you cannot return the medication, check the label or package insert to see if the medication should be thrown out in the garbage or flushed down  the toilet. If you are not sure, ask your care team. If it is safe to put it in the trash, empty the medication out of the container. Mix the medication with cat litter, dirt, coffee grounds, or other unwanted substance. Seal the mixture in a bag or container. Put it in the trash. NOTE: This sheet is a summary. It may not cover all possible information. If you have questions about this medicine, talk to your doctor, pharmacist, or health care provider.  2023 Elsevier/Gold Standard (2007-10-06 00:00:00)

## 2022-12-16 ENCOUNTER — Telehealth: Payer: Self-pay | Admitting: Infectious Disease

## 2022-12-16 NOTE — Telephone Encounter (Signed)
I was alerted via epic alert re + HIV test  Looks like first step which is EIA/Antigen test is positive and confirmatory test HIV1/2 WBlot is negative  These tests are supposed to  have a 3rd test built  in run to determine if this is a false positive EISA or acute HIV infection--which is a qualitative RNA  Unforatunately I have found that this final requisite step sometimes is not run --frequently because separate aliquot made and turn around time regardless is not quick  It might be prudent to just bring her back and check an HIV RNA quant (viral load)  We would be more than happy to see her for HIV treatment (if true +)  HIV prevention and/or hep b --

## 2022-12-17 LAB — HIV-1/2 AB - DIFFERENTIATION: HIV-2 Ab: NEGATIVE

## 2022-12-19 LAB — HIV-1/2 AB - DIFFERENTIATION: HIV-1 antibody: NEGATIVE

## 2022-12-19 LAB — QUANTIFERON-TB GOLD PLUS
Mitogen-NIL: 9.91 IU/mL
NIL: 0.07 IU/mL
QuantiFERON-TB Gold Plus: NEGATIVE
TB1-NIL: <0 IU/mL
TB2-NIL: <0 IU/mL

## 2022-12-19 LAB — HEPATITIS B DNA, ULTRAQUANTITATIVE, PCR
Hepatitis B DNA: NOT DETECTED IU/mL
Hepatitis B virus DNA: NOT DETECTED Log IU/mL

## 2022-12-19 LAB — HIV ANTIBODY (ROUTINE TESTING W REFLEX): HIV 1&2 Ab, 4th Generation: REACTIVE — AB

## 2022-12-19 LAB — HIV 1 RNA, QL RT PCR: HIV-1 RNA, Qualitative, TMA: NOT DETECTED

## 2022-12-20 NOTE — Progress Notes (Signed)
I called the patient to discuss results.   HIV RNA negative.  I checked with Dr. Daiva Eves and the patient does not currently require any further workup.

## 2022-12-21 ENCOUNTER — Other Ambulatory Visit: Payer: Self-pay | Admitting: Orthopaedic Surgery

## 2022-12-21 MED ORDER — TIZANIDINE HCL 4 MG PO TABS: 4.0000 mg | ORAL_TABLET | Freq: Four times a day (QID) | ORAL | 0 refills | Status: DC | PRN

## 2022-12-21 MED ORDER — HYDROCODONE-ACETAMINOPHEN 5-325 MG PO TABS: 1.0000 | ORAL_TABLET | Freq: Four times a day (QID) | ORAL | 0 refills | Status: DC | PRN

## 2022-12-30 ENCOUNTER — Other Ambulatory Visit: Payer: Self-pay | Admitting: Physician Assistant

## 2022-12-30 MED ORDER — HYDROCODONE-ACETAMINOPHEN 5-325 MG PO TABS: 1.0000 | ORAL_TABLET | Freq: Four times a day (QID) | ORAL | 0 refills | Status: DC | PRN

## 2022-12-30 MED ORDER — TIZANIDINE HCL 4 MG PO TABS: 4.0000 mg | ORAL_TABLET | Freq: Four times a day (QID) | ORAL | 1 refills | Status: DC | PRN

## 2023-01-03 ENCOUNTER — Ambulatory Visit: Payer: Medicare Other | Admitting: Physical Medicine & Rehabilitation

## 2023-01-04 ENCOUNTER — Telehealth: Payer: Self-pay

## 2023-01-04 ENCOUNTER — Encounter: Payer: Self-pay | Admitting: Physical Medicine & Rehabilitation

## 2023-01-04 NOTE — Telephone Encounter (Signed)
Patient advised Ok to continue plaquenil as prescribed. Patient verbalized understanding.

## 2023-01-04 NOTE — Telephone Encounter (Signed)
Patient contacted the office and states she is on Hydroxychloroquine 200 mg. Patient states she is going to have an radiofrequency ablation for her L5 and S1-S3 on 01/06/2023. Patient inquires if she can still have the ablation done while still taking the Hydroxychloroquine. Patient call back number is [phone]. Please advise.

## 2023-01-04 NOTE — Telephone Encounter (Signed)
Ok to continue plaquenil as prescribed.

## 2023-01-05 NOTE — Progress Notes (Signed)
  PROCEDURE RECORD [name]   Name: Kaylee Salas DOB:1966/07/04 MRN: [id number]  Date:01/05/2023  Physician: Claudette Laws, MD    Nurse/CMA: Charise Carwin MA  Allergies: No Known Allergies  Consent Signed: Yes.    Is patient diabetic? No.  CBG today? N/a  Pregnant: No. LMP: No LMP recorded. Patient is postmenopausal. (age [age])  Anticoagulants: no Anti-inflammatory: no Antibiotics: no  Procedure: Right Sacroiliac Radiofrequency   Position: Prone Start Time: 2:29 pm  End Time: 2:58 pm  Fluoro Time: 55  RN/CMA Kayleanna Lorman MA Hope Holst MA    Time 2:10 pm 3:01 pm    BP 103/76 116/74    Pulse 93 90    Respirations 16 16    O2 Sat 96 95    S/S 6 6    Pain Level 6/10 3/10     D/C home with  Sister-In-Law, [name] & O X 3, D/C instructions reviewed, and sits independently.

## 2023-01-06 ENCOUNTER — Encounter: Payer: Self-pay | Admitting: Physical Medicine & Rehabilitation

## 2023-01-06 ENCOUNTER — Other Ambulatory Visit: Payer: Self-pay | Admitting: Orthopaedic Surgery

## 2023-01-06 ENCOUNTER — Encounter: Payer: 59 | Attending: Physical Medicine & Rehabilitation | Admitting: Physical Medicine & Rehabilitation

## 2023-01-06 VITALS — BP 103/72 | HR 93 | Temp 98.2°F | Ht 62.0 in | Wt 175.0 lb

## 2023-01-06 DIAGNOSIS — Z96651 Presence of right artificial knee joint: Secondary | ICD-10-CM | POA: Insufficient documentation

## 2023-01-06 DIAGNOSIS — M533 Sacrococcygeal disorders, not elsewhere classified: Secondary | ICD-10-CM | POA: Diagnosis not present

## 2023-01-06 DIAGNOSIS — M1711 Unilateral primary osteoarthritis, right knee: Secondary | ICD-10-CM | POA: Diagnosis present

## 2023-01-06 DIAGNOSIS — Z96642 Presence of left artificial hip joint: Secondary | ICD-10-CM | POA: Diagnosis not present

## 2023-01-06 DIAGNOSIS — G8929 Other chronic pain: Secondary | ICD-10-CM | POA: Diagnosis not present

## 2023-01-06 MED ORDER — LIDOCAINE HCL 1 % IJ SOLN
10.0000 mL | Freq: Once | INTRAMUSCULAR | Status: AC
Start: 2023-01-06 — End: 2023-01-06
  Administered 2023-01-06: 10 mL

## 2023-01-06 MED ORDER — HYDROCODONE-ACETAMINOPHEN 5-325 MG PO TABS: 1.0000 | ORAL_TABLET | Freq: Four times a day (QID) | ORAL | 0 refills | Status: DC | PRN

## 2023-01-06 MED ORDER — LIDOCAINE HCL (PF) 2 % IJ SOLN
5.0000 mL | Freq: Once | INTRAMUSCULAR | Status: AC
Start: 2023-01-06 — End: 2023-01-06
  Administered 2023-01-06: 5 mL

## 2023-01-06 NOTE — Progress Notes (Signed)
Left Sacroiliac radio frequency Neurotomy  under fluoroscopic guidance This consists of  L5 dorsal ramus , S1,2,3 lateral branch radiofrequency Neurotomy    Indication is sacroiliac pain which has improved temporarily  by at least 50% after Intraarticular sacroiliac and/or L5 DR, S1.2.3 lateral branch blocks under fluoroscopic guidance Pain interferes with self-care and mobility and has failed to respond to conservative measures.  Informed consent was obtained after discussing risks and benefits of the procedure with the patient these include bleeding bruising and infection temporary or permanent paralysis. The patient elects to proceed and has given written consent.  Patient placed prone on fluoroscopy table. Area marked and prepped with Betadine. Fluoroscopic images utilized to guide needle. 25-gauge 1.5 inch needle was used to anesthetize 5 injection points with 2 cc of 1% lidocaine each. Then a 18-gauge 10 cm RF needle with a 10 mm curved active tip was inserted under fluoroscopic guidance first targeting the S1 SA P./sacral ala junction, bone contact made and confirmed with lateral imaging.  motor stimulation at 2 Hz confirm proper needle location followed by injection of 1ml of 2% lidocaine MPF. imaging.  Motor stimulation at 2 Hz confirmed proper needle location followed by injection of one ML of the2% lidocaine solution. Radio frequency 80C for 90 seconds was performed. Then the infero- lateral aspect of the S1, S2 and lateral aspect of S3 sacral foramina were targeted. Bone contact made.  Motor stim at 2 Hz confirmed proper needle location. One ML of the 2% lidocaine solution was injected into each of 3 sites and radio frequency ablation 80C for 90 seconds was performed. Patient tolerated procedure well. Post procedure instructions given  

## 2023-01-06 NOTE — Patient Instructions (Signed)
You had a radio frequency procedure today This was done to alleviate joint pain in your sacral area We injected lidocaine which is a local anesthetic.  You may experience soreness at the injection sites. You may also experienced some irritation of the nerves that were heated I'm recommending ice for 30 minutes every 2 hours as needed for the next 24-48 hours   

## 2023-01-13 ENCOUNTER — Other Ambulatory Visit: Payer: Self-pay | Admitting: Orthopaedic Surgery

## 2023-01-13 MED ORDER — HYDROCODONE-ACETAMINOPHEN 5-325 MG PO TABS: 1.0000 | ORAL_TABLET | Freq: Four times a day (QID) | ORAL | 0 refills | Status: DC | PRN

## 2023-01-23 ENCOUNTER — Other Ambulatory Visit: Payer: Self-pay | Admitting: Orthopaedic Surgery

## 2023-01-24 MED ORDER — HYDROCODONE-ACETAMINOPHEN 5-325 MG PO TABS: 1.0000 | ORAL_TABLET | Freq: Four times a day (QID) | ORAL | 0 refills | Status: AC | PRN

## 2023-01-25 ENCOUNTER — Other Ambulatory Visit (INDEPENDENT_AMBULATORY_CARE_PROVIDER_SITE_OTHER): Payer: Medicare Other

## 2023-01-25 ENCOUNTER — Other Ambulatory Visit (INDEPENDENT_AMBULATORY_CARE_PROVIDER_SITE_OTHER): Payer: 59

## 2023-01-25 ENCOUNTER — Encounter: Payer: Self-pay | Admitting: Orthopaedic Surgery

## 2023-01-25 ENCOUNTER — Ambulatory Visit (INDEPENDENT_AMBULATORY_CARE_PROVIDER_SITE_OTHER): Payer: 59 | Admitting: Orthopaedic Surgery

## 2023-01-25 DIAGNOSIS — Z96643 Presence of artificial hip joint, bilateral: Secondary | ICD-10-CM

## 2023-01-25 DIAGNOSIS — M25562 Pain in left knee: Secondary | ICD-10-CM | POA: Diagnosis not present

## 2023-01-25 DIAGNOSIS — Z96651 Presence of right artificial knee joint: Secondary | ICD-10-CM

## 2023-01-25 DIAGNOSIS — G8929 Other chronic pain: Secondary | ICD-10-CM

## 2023-01-25 DIAGNOSIS — M1712 Unilateral primary osteoarthritis, left knee: Secondary | ICD-10-CM

## 2023-01-25 NOTE — Progress Notes (Signed)
The patient is continue to follow-up as a relates to her multiple joint replacements.  Most recently replaced her right hip in February of this year.  It is now been over 3 months.  We replaced her right knee in June 2023 and her left hip in March 2020.  She does have known arthritis of her left knee and that someone is in need of knee replacement for that knee.  She is followed by rheumatoid specialist as well as Dr. Clementeen Hoof who provides RF injections in her lower lumbar spine.  She is walking without an assistive device and doing well overall.  An AP pelvis and lateral both hips show well-seated total hip arthroplasties with no complicating features.  An AP and lateral of the right knee shows a well-seated total knee arthroplasty on the right side.  X-rays of the left knee show tricompartment arthritis with varus malalignment and significant loss of joint space in the medial joint line and patellofemoral joint.  Both hips move smoothly and fluidly.  Her right knee also has full range of motion and is ligamentously stable.  The left knee has slight varus malalignment and no effusion but global pain and patellofemoral crepitation.  From my standpoint I did provide her hydrocodone earlier this week and told her to now wean herself from narcotics.  I would like to see her back in 6 months to see how she is doing overall but no x-rays are needed.  At some point she does wish to proceed with a knee replacement on the left side.

## 2023-01-26 NOTE — Progress Notes (Deleted)
Office Visit Note  Patient: Kaylee Salas             Date of Birth: Aug 29, 1966           MRN: [id number]             PCP: Lorelei Pont, DO Referring: Lorelei Pont, DO Visit Date: 02/08/2023 Occupation: @GUAROCC @  Subjective:  Medication monitoring   History of Present Illness: Kaylee Salas is a 57 y.o. female with history of seropositive rheumatoid arthritis and osteoarthritis.  Patient remains on plaquenil 200 mg 1 tablet by mouth daily.   CBC and CMP updated on 02/03/23.  No baseline plaquenil eye examination on file.  Activities of Daily Living:  Patient reports morning stiffness for *** {minute/hour:19697}.   Patient {ACTIONS;DENIES/REPORTS:21021675::"Denies"} nocturnal pain.  Difficulty dressing/grooming: {ACTIONS;DENIES/REPORTS:21021675::"Denies"} Difficulty climbing stairs: {ACTIONS;DENIES/REPORTS:21021675::"Denies"} Difficulty getting out of chair: {ACTIONS;DENIES/REPORTS:21021675::"Denies"} Difficulty using hands for taps, buttons, cutlery, and/or writing: {ACTIONS;DENIES/REPORTS:21021675::"Denies"}  No Rheumatology ROS completed.   PMFS History:  Patient Active Problem List   Diagnosis Date Noted   Status post total replacement of right hip 10/04/2022   Iron deficiency anemia 07/18/2022   Unilateral primary osteoarthritis, left knee 02/08/2022   Status post total right knee replacement 02/08/2022   Abnormal thyroid blood test 10/15/2021   Unilateral primary osteoarthritis, right hip 03/24/2021   Status post total replacement of left hip 10/30/2018    Past Medical History:  Diagnosis Date   Anemia    Anxiety    Arthritis    Complication of anesthesia    "woke up panicking/fighting"   Depression    Osteoporosis    PONV (postoperative nausea and vomiting)    Sacroiliac joint disease    Unilateral primary osteoarthritis, left hip 10/30/2018    Family History  Problem Relation Age of Onset   Hypertension Mother    Hyperlipidemia Mother    Heart attack  Mother    Heart attack Father    Hyperlipidemia Father    Hypertension Father    Heart failure Father    Past Surgical History:  Procedure Laterality Date   CHOLECYSTECTOMY  2004   GASTRIC BYPASS     HAND SURGERY Right    ring finger reconstruction   HERNIA REPAIR     KNEE ARTHROSCOPY W/ ACL RECONSTRUCTION Right 1997   TOTAL HIP ARTHROPLASTY Left 10/30/2018   Procedure: LEFT TOTAL HIP ARTHROPLASTY ANTERIOR APPROACH;  Surgeon: Kathryne Hitch, MD;  Location: [facility];  Service: Orthopedics;  Laterality: Left;   TOTAL HIP ARTHROPLASTY Right 10/04/2022   Procedure: RIGHT TOTAL HIP ARTHROPLASTY ANTERIOR APPROACH;  Surgeon: Kathryne Hitch, MD;  Location: [facility];  Service: Orthopedics;  Laterality: Right;   TOTAL KNEE ARTHROPLASTY Right 02/08/2022   Procedure: RIGHT TOTAL KNEE ARTHROPLASTY;  Surgeon: Kathryne Hitch, MD;  Location: [facility];  Service: Orthopedics;  Laterality: Right;   UMBILICAL HERNIA REPAIR  2004   Social History   Social History Narrative   Not on file    There is no immunization history on file for this patient.   Objective: Vital Signs: There were no vitals taken for this visit.   Physical Exam Vitals and nursing note reviewed.  Constitutional:      Appearance: She is well-developed.  HENT:     Head: Normocephalic and atraumatic.  Eyes:     Conjunctiva/sclera: Conjunctivae normal.  Cardiovascular:     Rate and Rhythm: Normal rate and regular rhythm.     Heart sounds: Normal heart sounds.  Pulmonary:  Effort: Pulmonary effort is normal.     Breath sounds: Normal breath sounds.  Abdominal:     General: Bowel sounds are normal.     Palpations: Abdomen is soft.  Musculoskeletal:     Cervical back: Normal range of motion.  Lymphadenopathy:     Cervical: No cervical adenopathy.  Skin:    General: Skin is warm and dry.     Capillary Refill: Capillary refill takes less than 2 seconds.  Neurological:     Mental Status: She is alert and  oriented to person, place, and time.  Psychiatric:        Behavior: Behavior normal.      Musculoskeletal Exam: ***  CDAI Exam: CDAI Score: -- Patient Global: --; Provider Global: -- Swollen: --; Tender: -- Joint Exam 02/08/2023   No joint exam has been documented for this visit   There is currently no information documented on the homunculus. Go to the Rheumatology activity and complete the homunculus joint exam.  Investigation: No additional findings.  Imaging: XR Knee 1-2 Views Left  Result Date: 01/25/2023 2 views of the left knee show tricompartment arthritis with varus malalignment, significant medial joint space narrowing and patellofemoral narrowing.  There are osteophytes around the knee.  XR Knee 1-2 Views Right  Result Date: 01/25/2023 2 views of the right knee shows a well-seated total knee arthroplasty with no complicating features.  XR HIPS BILAT W OR W/O PELVIS 2V  Result Date: 01/25/2023 An AP pelvis and lateral of both hips shows well-seated total hip arthroplasties with no complicating features.   Recent Labs: Lab Results  Component Value Date   WBC 6.8 11/22/2022   HGB 12.9 11/22/2022   PLT 355 11/22/2022   NA 140 11/22/2022   K 4.6 11/22/2022   CL 105 11/22/2022   CO2 25 11/22/2022   GLUCOSE 77 11/22/2022   BUN 22 11/22/2022   CREATININE 0.93 11/22/2022   BILITOT 0.2 11/22/2022   BILITOT 0.2 11/22/2022   ALKPHOS 121 07/28/2022   AST 16 11/22/2022   AST 16 11/22/2022   ALT 11 11/22/2022   ALT 11 11/22/2022   PROT 7.2 11/22/2022   PROT 7.2 11/22/2022   PROT 7.2 11/22/2022   ALBUMIN 3.7 07/28/2022   CALCIUM 9.1 11/22/2022   GFRAA >60 10/31/2018   QFTBGOLDPLUS NEGATIVE 12/14/2022    Speciality Comments: No specialty comments available.  Procedures:  No procedures performed Allergies: Patient has no known allergies.   Assessment / Plan:     Visit Diagnoses: Rheumatoid arthritis involving multiple sites with positive rheumatoid  factor (HCC)  High risk medication use  Elevated sed rate  Primary osteoarthritis of both hands  Chronic pain of both shoulders  Primary osteoarthritis of both feet  Myofascial pain  Status post total replacement of left hip  Status post total replacement of right hip  Status post total right knee replacement  DDD (degenerative disc disease), cervical  History of iron deficiency anemia  Hepatitis B antibody positive  Orders: No orders of the defined types were placed in this encounter.  No orders of the defined types were placed in this encounter.   Face-to-face time spent with patient was *** minutes. Greater than 50% of time was spent in counseling and coordination of care.  Follow-Up Instructions: No follow-ups on file.   Gearldine Bienenstock, PA-C  Note - This record has been created using Dragon software.  Chart creation errors have been sought, but may not always  have been located. Such  creation errors do not reflect on  the standard of medical care.

## 2023-01-27 ENCOUNTER — Encounter (HOSPITAL_BASED_OUTPATIENT_CLINIC_OR_DEPARTMENT_OTHER): Payer: 59 | Admitting: Physical Medicine & Rehabilitation

## 2023-01-27 ENCOUNTER — Encounter: Payer: Self-pay | Admitting: Physical Medicine & Rehabilitation

## 2023-01-27 VITALS — BP 119/79 | HR 109 | Ht 62.0 in | Wt 179.0 lb

## 2023-01-27 DIAGNOSIS — Z96642 Presence of left artificial hip joint: Secondary | ICD-10-CM | POA: Diagnosis not present

## 2023-01-27 DIAGNOSIS — M1711 Unilateral primary osteoarthritis, right knee: Secondary | ICD-10-CM

## 2023-01-27 DIAGNOSIS — Z96651 Presence of right artificial knee joint: Secondary | ICD-10-CM | POA: Diagnosis not present

## 2023-01-27 DIAGNOSIS — G8929 Other chronic pain: Secondary | ICD-10-CM | POA: Diagnosis not present

## 2023-01-27 DIAGNOSIS — M533 Sacrococcygeal disorders, not elsewhere classified: Secondary | ICD-10-CM

## 2023-01-27 MED ORDER — TRAMADOL HCL 50 MG PO TABS: 50.0000 mg | ORAL_TABLET | Freq: Two times a day (BID) | ORAL | 1 refills | Status: AC

## 2023-01-27 NOTE — Progress Notes (Signed)
Subjective:    Patient ID: Kaylee Salas, female    DOB: Jan 03, 1966, 57 y.o.   MRN: [id number] 10/19/21 Right genicular RF  12/02/22 RIght Sacroiliac radio frequency Neurotomy  under fluoroscopic guidance This consists of  L5 dorsal ramus , S1,2,3 lateral branch radiofrequency Neurotomy      01/06/23  Left Sacroiliac radio frequency Neurotomy  under fluoroscopic guidance This consists of  L5 dorsal ramus , S1,2,3 lateral branch radiofrequency Neurotomy       HPI 57 year old female with chronic multifactorial pain.  Pain diagram indicates pain in both shoulders elbows hands hips knees and ankles.  She states that her pain is moderate at rest but increases to a 7 or even 8 out of 10 with activity.  Walking bending sitting standing increases the pain RA diagnosed by Rheumatology  Started on Hydroxychloroquine by Rheumatologist  Released by Ortho Dr Magnus Ivan Right THR performed on 10/04/2022 Previous left total hip arthroplasty on 10/30/2018 still has some residual pain Right total knee replacement 02/08/2022 Now on methocarbamol  Bleeding from goody[employer] powders Meloxicam caused stomach problems  Hydrocodone 5mg   Tramadol  Has been nervous off hydroxyzine    Patient has a goal of getting some type of medicine that allows her to be more active and continue losing weight and hopefully get back to work   pain Inventory Average Pain 4 Pain Right Now 4 My pain is constant, sharp, burning, dull, stabbing, and aching  In the last 24 hours, has pain interfered with the following? General activity 7 Relation with others 7 Enjoyment of life 8 What TIME of day is your pain at its worst? evening and night Sleep (in general) Poor  Pain is worse with: walking, bending, sitting, standing, and some activites Pain improves with: rest, therapy/exercise, medication, and injections Relief from Meds: 5  Family History  Problem Relation Age of Onset   Hypertension Mother    Hyperlipidemia Mother     Heart attack Mother    Heart attack Father    Hyperlipidemia Father    Hypertension Father    Heart failure Father    Social History   Socioeconomic History   Marital status: Single    Spouse name: Not on file   Number of children: 0   Years of education: Not on file   Highest education level: Not on file  Occupational History   Not on file  Tobacco Use   Smoking status: Never    Passive exposure: Past   Smokeless tobacco: Never  Vaping Use   Vaping Use: Never used  Substance and Sexual Activity   Alcohol use: Not Currently   Drug use: Never   Sexual activity: Not Currently  Other Topics Concern   Not on file  Social History Narrative   Not on file   Social Determinants of Health   Financial Resource Strain: Not on file  Food Insecurity: No Food Insecurity (10/04/2022)   Hunger Vital Sign    Worried About Running Out of Food in the Last Year: Never true    Ran Out of Food in the Last Year: Never true  Recent Concern: Food Insecurity - Food Insecurity Present (07/18/2022)   Hunger Vital Sign    Worried About Running Out of Food in the Last Year: Often true    Ran Out of Food in the Last Year: Sometimes true  Transportation Needs: No Transportation Needs (10/04/2022)   PRAPARE - Administrator, Civil Service (Medical): No  Lack of Transportation (Non-Medical): No  Physical Activity: Not on file  Stress: Not on file  Social Connections: Not on file   Past Surgical History:  Procedure Laterality Date   CHOLECYSTECTOMY  2004   GASTRIC BYPASS     HAND SURGERY Right    ring finger reconstruction   HERNIA REPAIR     KNEE ARTHROSCOPY W/ ACL RECONSTRUCTION Right 1997   TOTAL HIP ARTHROPLASTY Left 10/30/2018   Procedure: LEFT TOTAL HIP ARTHROPLASTY ANTERIOR APPROACH;  Surgeon: Kathryne Hitch, MD;  Location: [facility];  Service: Orthopedics;  Laterality: Left;   TOTAL HIP ARTHROPLASTY Right 10/04/2022   Procedure: RIGHT TOTAL HIP ARTHROPLASTY ANTERIOR  APPROACH;  Surgeon: Kathryne Hitch, MD;  Location: [facility];  Service: Orthopedics;  Laterality: Right;   TOTAL KNEE ARTHROPLASTY Right 02/08/2022   Procedure: RIGHT TOTAL KNEE ARTHROPLASTY;  Surgeon: Kathryne Hitch, MD;  Location: [facility];  Service: Orthopedics;  Laterality: Right;   UMBILICAL HERNIA REPAIR  2004   Past Surgical History:  Procedure Laterality Date   CHOLECYSTECTOMY  2004   GASTRIC BYPASS     HAND SURGERY Right    ring finger reconstruction   HERNIA REPAIR     KNEE ARTHROSCOPY W/ ACL RECONSTRUCTION Right 1997   TOTAL HIP ARTHROPLASTY Left 10/30/2018   Procedure: LEFT TOTAL HIP ARTHROPLASTY ANTERIOR APPROACH;  Surgeon: Kathryne Hitch, MD;  Location: [facility];  Service: Orthopedics;  Laterality: Left;   TOTAL HIP ARTHROPLASTY Right 10/04/2022   Procedure: RIGHT TOTAL HIP ARTHROPLASTY ANTERIOR APPROACH;  Surgeon: Kathryne Hitch, MD;  Location: [facility];  Service: Orthopedics;  Laterality: Right;   TOTAL KNEE ARTHROPLASTY Right 02/08/2022   Procedure: RIGHT TOTAL KNEE ARTHROPLASTY;  Surgeon: Kathryne Hitch, MD;  Location: [facility];  Service: Orthopedics;  Laterality: Right;   UMBILICAL HERNIA REPAIR  2004   Past Medical History:  Diagnosis Date   Anemia    Anxiety    Arthritis    Complication of anesthesia    "woke up panicking/fighting"   Depression    Osteoporosis    PONV (postoperative nausea and vomiting)    Sacroiliac joint disease    Unilateral primary osteoarthritis, left hip 10/30/2018   BP 119/79   Pulse (!) 109   Ht 5\' 2"  (1.575 m)   Wt 179 lb (81.2 kg)   SpO2 92%   BMI 32.74 kg/m   Opioid Risk Score:   Fall Risk Score:  `1  Depression screen PHQ 2/9     01/06/2023    2:07 PM 07/18/2022    4:14 PM 07/18/2022    4:13 PM 06/30/2022    2:08 PM 03/29/2022   10:48 AM 12/07/2021    2:10 PM 05/18/2021    1:02 PM  Depression screen PHQ 2/9  Decreased Interest 1 3 3  0 1 0   Down, Depressed, Hopeless 1 2 2  0 1 0 0  PHQ - 2  Score 2 5 5  0 2 0 0  Altered sleeping  2       Tired, decreased energy  2       Change in appetite  1       Feeling bad or failure about yourself   1       Trouble concentrating  2       Moving slowly or fidgety/restless  2       Suicidal thoughts  0       PHQ-9 Score  15  Review of Systems  Musculoskeletal:        B/L shoulder arm hand  hip knee foot pain  All other systems reviewed and are negative.      Objective:   Physical Exam  General no acute distress Mood and affect anxious but otherwise appropriate The patient has normal range of motion bilateral shoulder elbow hands. Normal strength in the upper extremities. No hand or wrist deformities Lumbar spine has mild tenderness palpation lumbar paraspinal area no PSIS tenderness.  No pain over the greater trochanters. Ambulates without assistive device forward flexed posture no evidence of foot drop.      Assessment & Plan:  #1.  Chronic widespread pain we discussed that the majority of her pain is likely related to OA that is hip arthritis knee arthritis in the lumbar spine, we discussed that sacroiliac RF has been helpful for her and can be repeated every 6 months as needed  Patient plans to get left knee replacement and would like genicular RF to be done prior to that to ease with her postop rehabilitation.  This was helpful for her right side. We discussed that there are no pain medications proven to improve activity level.  Overall strategy would be to try something less sedating.  She has been on hydrocodone recently taking around 4 tablets/day and she is weaning off of this per instructions from orthopedics.  Will check a urine drug screen today and start her on tramadol which she has had in the past. Reduce cymbalta to 60mg  per day  UDS today  Tramadol 50mg  BID Took a clonazepam from Dr Leary Roca PCP (Dr Julius Bowels covering)  old rx Hydrocodone rx per Dr Clemens Catholic a tramadol from prior rx (?~1wk ago)   Follow-up in 1 month with nurse practitioner

## 2023-01-27 NOTE — Patient Instructions (Signed)
Reduce Cymbalta to 60mg 

## 2023-01-31 LAB — TOXASSURE SELECT,+ANTIDEPR,UR

## 2023-02-02 ENCOUNTER — Other Ambulatory Visit: Payer: Self-pay | Admitting: *Deleted

## 2023-02-02 DIAGNOSIS — R700 Elevated erythrocyte sedimentation rate: Secondary | ICD-10-CM

## 2023-02-02 DIAGNOSIS — Z79899 Other long term (current) drug therapy: Secondary | ICD-10-CM

## 2023-02-02 DIAGNOSIS — M0579 Rheumatoid arthritis with rheumatoid factor of multiple sites without organ or systems involvement: Secondary | ICD-10-CM

## 2023-02-03 ENCOUNTER — Encounter: Payer: Self-pay | Admitting: Hematology

## 2023-02-03 ENCOUNTER — Other Ambulatory Visit (HOSPITAL_COMMUNITY)
Admission: EM | Admit: 2023-02-03 | Discharge: 2023-02-03 | Disposition: A | Discharge status: Home / Self Care | Payer: Medicare Other | Source: Ambulatory Visit | Attending: Physician Assistant | Admitting: Physician Assistant

## 2023-02-03 DIAGNOSIS — Z79899 Other long term (current) drug therapy: Secondary | ICD-10-CM | POA: Diagnosis present

## 2023-02-03 LAB — CBC WITH DIFFERENTIAL/PLATELET
Abs Immature Granulocytes: 0.01 10*3/uL (ref 0.00–0.07)
Basophils Absolute: 0 10*3/uL (ref 0.0–0.1)
Basophils Relative: 1 %
Eosinophils Absolute: 0.1 10*3/uL (ref 0.0–0.5)
Eosinophils Relative: 2 %
HCT: 37.9 % (ref 36.0–46.0)
Hemoglobin: 12.2 g/dL (ref 12.0–15.0)
Immature Granulocytes: 0 %
Lymphocytes Relative: 28 %
Lymphs Abs: 1.8 10*3/uL (ref 0.7–4.0)
MCH: 30 pg (ref 26.0–34.0)
MCHC: 32.2 g/dL (ref 30.0–36.0)
MCV: 93.3 fL (ref 80.0–100.0)
Monocytes Absolute: 0.6 10*3/uL (ref 0.1–1.0)
Monocytes Relative: 10 %
Neutro Abs: 3.9 10*3/uL (ref 1.7–7.7)
Neutrophils Relative %: 59 %
Platelets: 261 10*3/uL (ref 150–400)
RBC: 4.06 MIL/uL (ref 3.87–5.11)
RDW: 14.4 % (ref 11.5–15.5)
WBC: 6.4 10*3/uL (ref 4.0–10.5)
nRBC: 0 % (ref 0.0–0.2)

## 2023-02-03 LAB — COMPREHENSIVE METABOLIC PANEL
ALT: 18 U/L (ref 0–44)
AST: 23 U/L (ref 15–41)
Albumin: 3.8 g/dL (ref 3.5–5.0)
Alkaline Phosphatase: 120 U/L (ref 38–126)
Anion gap: 9 (ref 5–15)
BUN: 20 mg/dL (ref 6–20)
CO2: 26 mmol/L (ref 22–32)
Calcium: 8.4 mg/dL — ABNORMAL LOW (ref 8.9–10.3)
Chloride: 99 mmol/L (ref 98–111)
Creatinine, Ser: 0.64 mg/dL (ref 0.44–1.00)
GFR, Estimated: >60 mL/min (ref >60)
Glucose, Bld: 104 mg/dL — ABNORMAL HIGH (ref 70–99)
Potassium: 4.5 mmol/L (ref 3.5–5.1)
Sodium: 134 mmol/L — ABNORMAL LOW (ref 135–145)
Total Bilirubin: 0.4 mg/dL (ref 0.3–1.2)
Total Protein: 6.8 g/dL (ref 6.5–8.1)

## 2023-02-06 ENCOUNTER — Other Ambulatory Visit: Payer: Self-pay | Admitting: *Deleted

## 2023-02-06 DIAGNOSIS — M2550 Pain in unspecified joint: Secondary | ICD-10-CM

## 2023-02-06 DIAGNOSIS — Z79899 Other long term (current) drug therapy: Secondary | ICD-10-CM

## 2023-02-06 DIAGNOSIS — R768 Other specified abnormal immunological findings in serum: Secondary | ICD-10-CM

## 2023-02-06 DIAGNOSIS — M0579 Rheumatoid arthritis with rheumatoid factor of multiple sites without organ or systems involvement: Secondary | ICD-10-CM

## 2023-02-06 DIAGNOSIS — Z5181 Encounter for therapeutic drug level monitoring: Secondary | ICD-10-CM

## 2023-02-06 DIAGNOSIS — R700 Elevated erythrocyte sedimentation rate: Secondary | ICD-10-CM

## 2023-02-06 NOTE — Progress Notes (Signed)
RF usually does not correlate disease activity but if she would like it rechecked we can try to add it

## 2023-02-06 NOTE — Progress Notes (Signed)
CBC WNL  Calcium is low-8.4.  Recommend increasing dietary calcium intake. Please clarify if she is taking a vitamin D supplement?

## 2023-02-08 ENCOUNTER — Ambulatory Visit: Payer: Medicare Other | Admitting: Physician Assistant

## 2023-02-08 DIAGNOSIS — Z862 Personal history of diseases of the blood and blood-forming organs and certain disorders involving the immune mechanism: Secondary | ICD-10-CM

## 2023-02-08 DIAGNOSIS — M19072 Primary osteoarthritis, left ankle and foot: Secondary | ICD-10-CM

## 2023-02-08 DIAGNOSIS — M0579 Rheumatoid arthritis with rheumatoid factor of multiple sites without organ or systems involvement: Secondary | ICD-10-CM

## 2023-02-08 DIAGNOSIS — G8929 Other chronic pain: Secondary | ICD-10-CM

## 2023-02-08 DIAGNOSIS — M19041 Primary osteoarthritis, right hand: Secondary | ICD-10-CM

## 2023-02-08 DIAGNOSIS — R700 Elevated erythrocyte sedimentation rate: Secondary | ICD-10-CM

## 2023-02-08 DIAGNOSIS — M7918 Myalgia, other site: Secondary | ICD-10-CM

## 2023-02-08 DIAGNOSIS — Z96641 Presence of right artificial hip joint: Secondary | ICD-10-CM

## 2023-02-08 DIAGNOSIS — Z79899 Other long term (current) drug therapy: Secondary | ICD-10-CM

## 2023-02-08 DIAGNOSIS — R768 Other specified abnormal immunological findings in serum: Secondary | ICD-10-CM

## 2023-02-08 DIAGNOSIS — M5030 Other cervical disc degeneration, unspecified cervical region: Secondary | ICD-10-CM

## 2023-02-08 DIAGNOSIS — Z96651 Presence of right artificial knee joint: Secondary | ICD-10-CM

## 2023-02-08 DIAGNOSIS — Z96642 Presence of left artificial hip joint: Secondary | ICD-10-CM

## 2023-02-13 ENCOUNTER — Encounter: Payer: Self-pay | Admitting: Orthopaedic Surgery

## 2023-02-13 ENCOUNTER — Encounter: Payer: Self-pay | Admitting: Physical Medicine & Rehabilitation

## 2023-02-13 NOTE — Progress Notes (Deleted)
Office Visit Note  Patient: Kaylee Salas             Date of Birth: October 25, 1965           MRN: [id number]             PCP: Lorelei Pont, DO Referring: Lorelei Pont, DO Visit Date: 02/21/2023 Occupation: @GUAROCC @  Subjective:  No chief complaint on file.   History of Present Illness: Kaylee Salas is a 57 y.o. female ***     Activities of Daily Living:  Patient reports morning stiffness for *** {minute/hour:19697}.   Patient {ACTIONS;DENIES/REPORTS:21021675::"Denies"} nocturnal pain.  Difficulty dressing/grooming: {ACTIONS;DENIES/REPORTS:21021675::"Denies"} Difficulty climbing stairs: {ACTIONS;DENIES/REPORTS:21021675::"Denies"} Difficulty getting out of chair: {ACTIONS;DENIES/REPORTS:21021675::"Denies"} Difficulty using hands for taps, buttons, cutlery, and/or writing: {ACTIONS;DENIES/REPORTS:21021675::"Denies"}  No Rheumatology ROS completed.   PMFS History:  Patient Active Problem List   Diagnosis Date Noted   Status post total replacement of right hip 10/04/2022   Iron deficiency anemia 07/18/2022   Unilateral primary osteoarthritis, left knee 02/08/2022   Status post total right knee replacement 02/08/2022   Abnormal thyroid blood test 10/15/2021   Unilateral primary osteoarthritis, right hip 03/24/2021   Status post total replacement of left hip 10/30/2018    Past Medical History:  Diagnosis Date   Anemia    Anxiety    Arthritis    Complication of anesthesia    "woke up panicking/fighting"   Depression    Osteoporosis    PONV (postoperative nausea and vomiting)    Sacroiliac joint disease    Unilateral primary osteoarthritis, left hip 10/30/2018    Family History  Problem Relation Age of Onset   Hypertension Mother    Hyperlipidemia Mother    Heart attack Mother    Heart attack Father    Hyperlipidemia Father    Hypertension Father    Heart failure Father    Past Surgical History:  Procedure Laterality Date   CHOLECYSTECTOMY  2004   GASTRIC  BYPASS     HAND SURGERY Right    ring finger reconstruction   HERNIA REPAIR     KNEE ARTHROSCOPY W/ ACL RECONSTRUCTION Right 1997   TOTAL HIP ARTHROPLASTY Left 10/30/2018   Procedure: LEFT TOTAL HIP ARTHROPLASTY ANTERIOR APPROACH;  Surgeon: Kathryne Hitch, MD;  Location: [facility] OR;  Service: Orthopedics;  Laterality: Left;   TOTAL HIP ARTHROPLASTY Right 10/04/2022   Procedure: RIGHT TOTAL HIP ARTHROPLASTY ANTERIOR APPROACH;  Surgeon: Kathryne Hitch, MD;  Location: [facility] OR;  Service: Orthopedics;  Laterality: Right;   TOTAL KNEE ARTHROPLASTY Right 02/08/2022   Procedure: RIGHT TOTAL KNEE ARTHROPLASTY;  Surgeon: Kathryne Hitch, MD;  Location: [facility] OR;  Service: Orthopedics;  Laterality: Right;   UMBILICAL HERNIA REPAIR  2004   Social History   Social History Narrative   Not on file    There is no immunization history on file for this patient.   Objective: Vital Signs: There were no vitals taken for this visit.   Physical Exam   Musculoskeletal Exam: ***  CDAI Exam: CDAI Score: -- Patient Global: --; Provider Global: -- Swollen: --; Tender: -- Joint Exam 02/21/2023   No joint exam has been documented for this visit   There is currently no information documented on the homunculus. Go to the Rheumatology activity and complete the homunculus joint exam.  Investigation: No additional findings.  Imaging: XR Knee 1-2 Views Left  Result Date: 01/25/2023 2 views of the left knee show tricompartment arthritis with varus malalignment, significant medial joint space  narrowing and patellofemoral narrowing.  There are osteophytes around the knee.  XR Knee 1-2 Views Right  Result Date: 01/25/2023 2 views of the right knee shows a well-seated total knee arthroplasty with no complicating features.  XR HIPS BILAT W OR W/O PELVIS 2V  Result Date: 01/25/2023 An AP pelvis and lateral of both hips shows well-seated total hip arthroplasties with no complicating features.    Recent Labs: Lab Results  Component Value Date   WBC 6.4 02/03/2023   HGB 12.2 02/03/2023   PLT 261 02/03/2023   NA 134 (L) 02/03/2023   K 4.5 02/03/2023   CL 99 02/03/2023   CO2 26 02/03/2023   GLUCOSE 104 (H) 02/03/2023   BUN 20 02/03/2023   CREATININE 0.64 02/03/2023   BILITOT 0.4 02/03/2023   ALKPHOS 120 02/03/2023   AST 23 02/03/2023   ALT 18 02/03/2023   PROT 6.8 02/03/2023   ALBUMIN 3.8 02/03/2023   CALCIUM 8.4 (L) 02/03/2023   GFRAA >60 10/31/2018   QFTBGOLDPLUS NEGATIVE 12/14/2022    Speciality Comments: No specialty comments available.  Procedures:  No procedures performed Allergies: Patient has no known allergies.   Assessment / Plan:     Visit Diagnoses: No diagnosis found.  Orders: No orders of the defined types were placed in this encounter.  No orders of the defined types were placed in this encounter.   Face-to-face time spent with patient was *** minutes. Greater than 50% of time was spent in counseling and coordination of care.  Follow-Up Instructions: No follow-ups on file.   Ellen Henri, CMA  Note - This record has been created using Animal nutritionist.  Chart creation errors have been sought, but may not always  have been located. Such creation errors do not reflect on  the standard of medical care.

## 2023-02-20 ENCOUNTER — Telehealth: Payer: Self-pay | Admitting: *Deleted

## 2023-02-20 NOTE — Telephone Encounter (Signed)
Copied from [place]. Patient has f/u with ET on Friday this week.  Hi Kirsteins, I tried the tramadol 50mg  TID since advised. I am still really hurting in my feet & ankles and around the soft tissue around all 3 joint replacements and in my lower back.  I am having to use a lot of Goodies powders to try to keep moving. My pain is causing a stall in my daily routine of chores & exercise.  I do not want to become sedate again & lose control of my weight.  Best regards  Beryl Meager

## 2023-02-21 ENCOUNTER — Ambulatory Visit: Payer: Medicare Other | Admitting: Physician Assistant

## 2023-02-21 DIAGNOSIS — Z862 Personal history of diseases of the blood and blood-forming organs and certain disorders involving the immune mechanism: Secondary | ICD-10-CM

## 2023-02-21 DIAGNOSIS — G8929 Other chronic pain: Secondary | ICD-10-CM

## 2023-02-21 DIAGNOSIS — M5030 Other cervical disc degeneration, unspecified cervical region: Secondary | ICD-10-CM

## 2023-02-21 DIAGNOSIS — R768 Other specified abnormal immunological findings in serum: Secondary | ICD-10-CM

## 2023-02-21 DIAGNOSIS — M0579 Rheumatoid arthritis with rheumatoid factor of multiple sites without organ or systems involvement: Secondary | ICD-10-CM

## 2023-02-21 DIAGNOSIS — M19071 Primary osteoarthritis, right ankle and foot: Secondary | ICD-10-CM

## 2023-02-21 DIAGNOSIS — Z96642 Presence of left artificial hip joint: Secondary | ICD-10-CM

## 2023-02-21 DIAGNOSIS — R700 Elevated erythrocyte sedimentation rate: Secondary | ICD-10-CM

## 2023-02-21 DIAGNOSIS — M7918 Myalgia, other site: Secondary | ICD-10-CM

## 2023-02-21 DIAGNOSIS — M19041 Primary osteoarthritis, right hand: Secondary | ICD-10-CM

## 2023-02-21 DIAGNOSIS — Z79899 Other long term (current) drug therapy: Secondary | ICD-10-CM

## 2023-02-21 DIAGNOSIS — Z96651 Presence of right artificial knee joint: Secondary | ICD-10-CM

## 2023-02-21 DIAGNOSIS — Z96641 Presence of right artificial hip joint: Secondary | ICD-10-CM

## 2023-02-24 ENCOUNTER — Encounter: Payer: Medicare Other | Admitting: Registered Nurse

## 2023-02-27 IMAGING — DX DG KNEE 1-2V PORT*R*
2 series · 2 of 2 positions shown · non-contrast
Comparison: 12/01/2021, without report

CLINICAL DATA: Postop for knee arthroplasty

EXAM:
PORTABLE RIGHT KNEE - 1-2 VIEW

[knee ap]
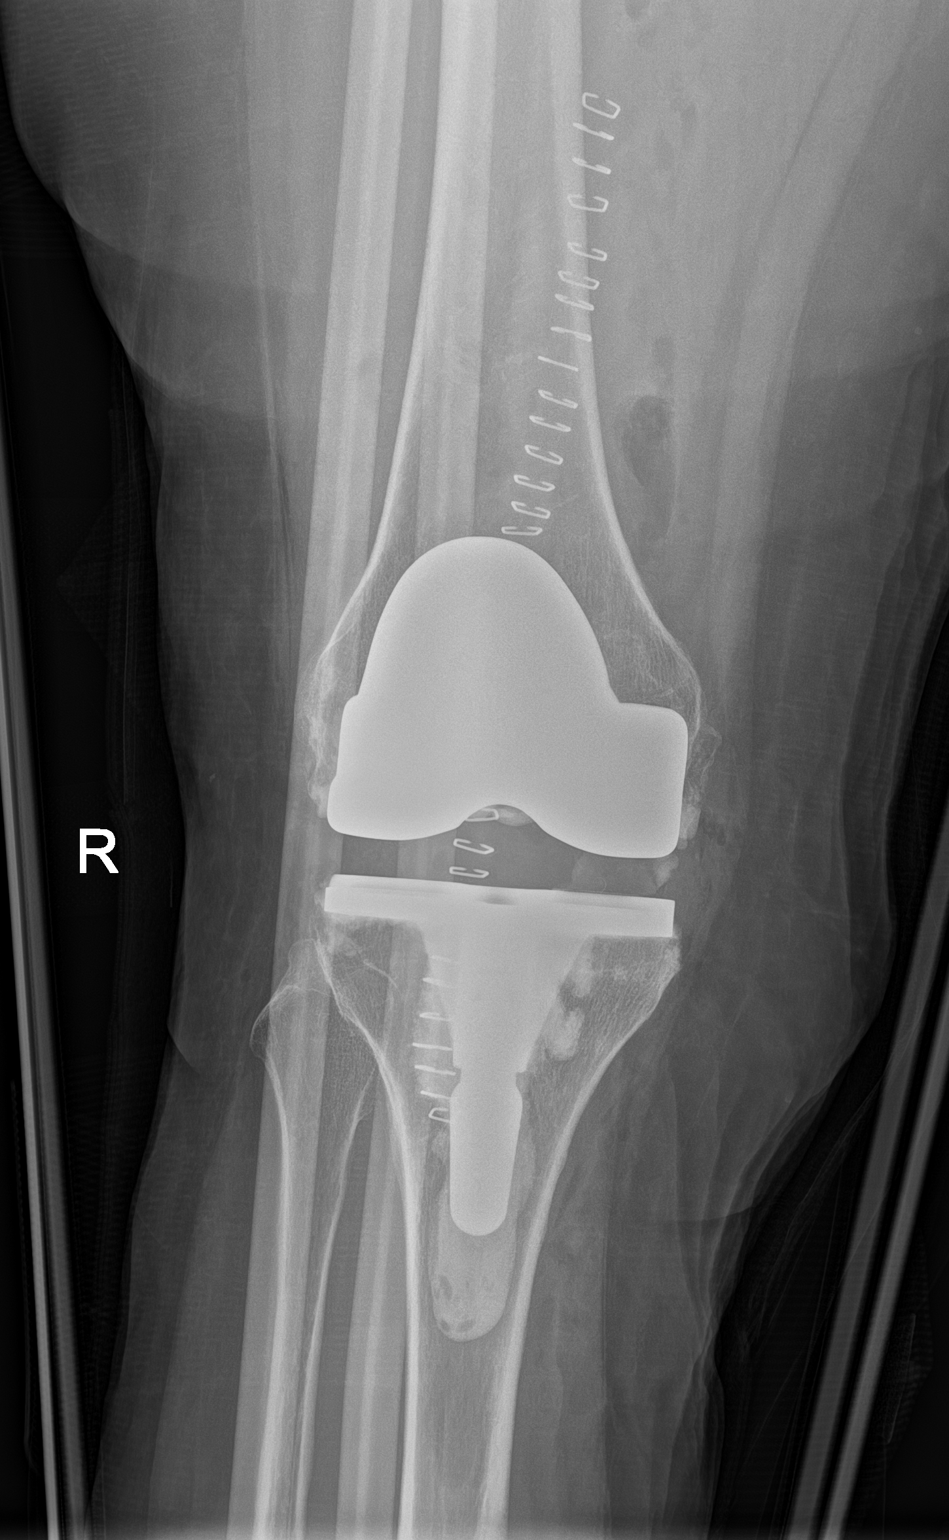

[knee lat]
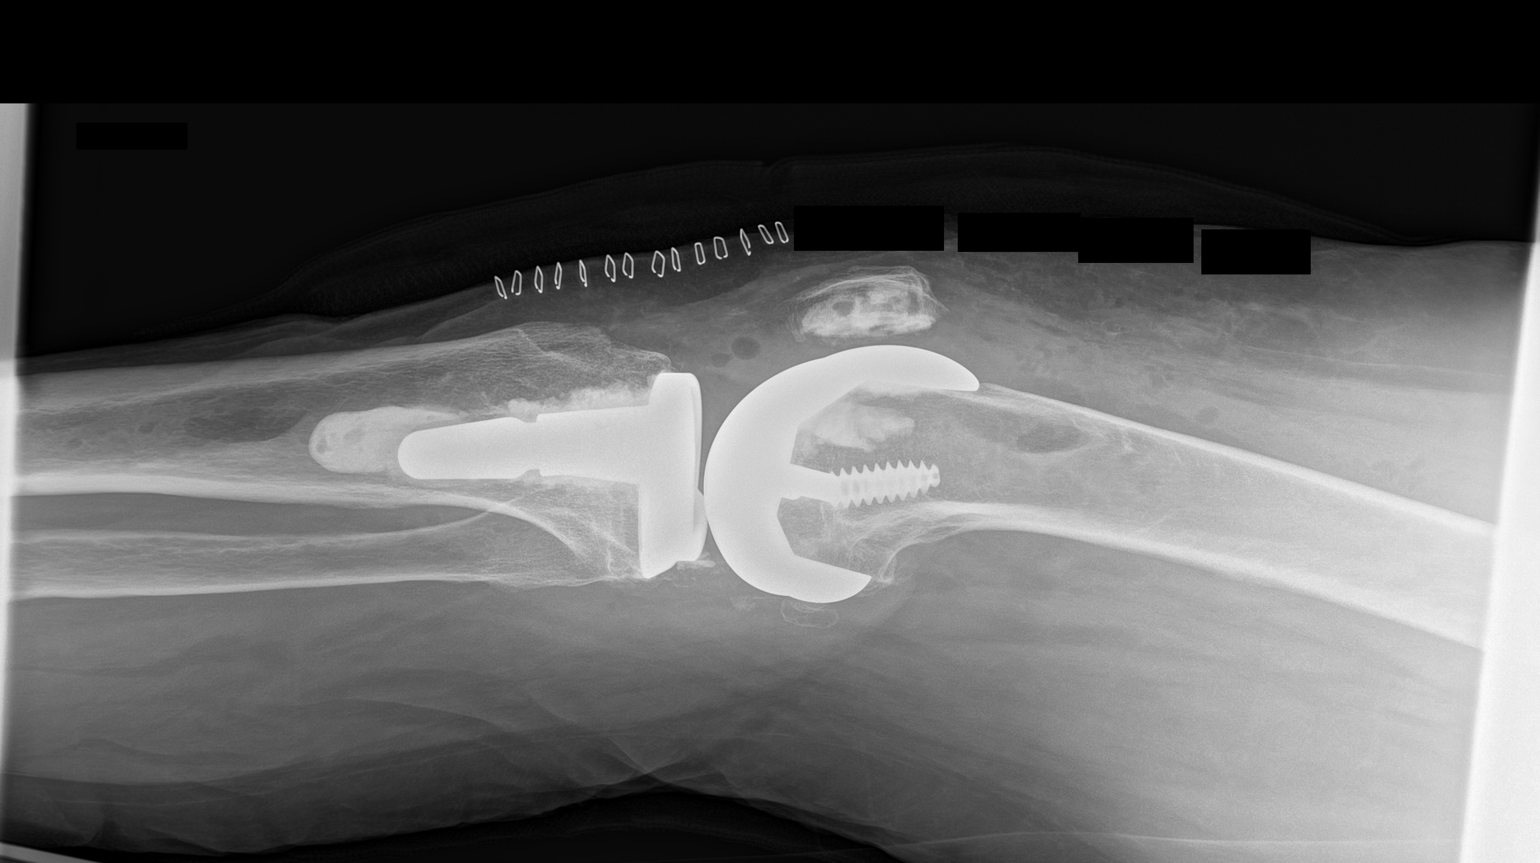

[2 of 2 positions shown; findings below may reference images not displayed]

FINDINGS: Status post right knee arthroplasty with overlying surgical staples
and anterior edema/gas. Posterior intra-articular loose bodies.
IMPRESSION: Expected appearance after right knee arthroplasty.

## 2023-03-07 ENCOUNTER — Other Ambulatory Visit: Payer: Self-pay | Admitting: Physician Assistant

## 2023-03-07 NOTE — Telephone Encounter (Signed)
LMOM for patient to reschedule appointment.  

## 2023-03-07 NOTE — Telephone Encounter (Signed)
Last Fill: 12/14/2022  Eye exam: Not performed yet.   Labs: 02/03/2023  CBC WNL Calcium is low-8.4.  Recommend increasing dietary calcium intake. Please clarify if she is taking a vitamin D supplement?  Next Visit:  Return in about 8 weeks (around 02/08/2023) for Rheumatoid arthritis.   Last Visit: 12/14/2022  GE:[id number] arthritis involving multiple sites with positive rheumatoid factor   Current Dose per office note 12/14/2022:  Plaquenil 200 mg once daily   Okay to refill Plaquenil?

## 2023-07-06 ENCOUNTER — Encounter: Payer: Self-pay | Admitting: Hematology

## 2023-11-29 ENCOUNTER — Other Ambulatory Visit: Payer: Self-pay | Admitting: Physician Assistant

## 2023-11-29 DIAGNOSIS — E538 Deficiency of other specified B group vitamins: Secondary | ICD-10-CM

## 2023-12-05 DIAGNOSIS — E538 Deficiency of other specified B group vitamins: Secondary | ICD-10-CM | POA: Diagnosis not present

## 2023-12-05 DIAGNOSIS — Z136 Encounter for screening for cardiovascular disorders: Secondary | ICD-10-CM | POA: Diagnosis not present

## 2023-12-05 DIAGNOSIS — R252 Cramp and spasm: Secondary | ICD-10-CM | POA: Diagnosis not present

## 2023-12-05 DIAGNOSIS — Z0000 Encounter for general adult medical examination without abnormal findings: Secondary | ICD-10-CM | POA: Diagnosis not present

## 2023-12-05 DIAGNOSIS — Z9884 Bariatric surgery status: Secondary | ICD-10-CM | POA: Diagnosis not present

## 2023-12-05 DIAGNOSIS — Z5320 Procedure and treatment not carried out because of patient's decision for unspecified reasons: Secondary | ICD-10-CM | POA: Diagnosis not present

## 2023-12-07 ENCOUNTER — Encounter: Payer: Self-pay | Admitting: Hematology

## 2023-12-12 ENCOUNTER — Encounter: Admitting: Physical Medicine & Rehabilitation
